# Patient Record
Sex: Male | Born: 1955 | Race: Black or African American | Hispanic: No | Marital: Married | Smoking: Never smoker
Health system: Southern US, Community
[De-identification: ages and names within clinical notes are randomized; demographics above are authoritative.]

## PROBLEM LIST (undated history)

## (undated) DIAGNOSIS — N3281 Overactive bladder: Secondary | ICD-10-CM

## (undated) DIAGNOSIS — R413 Other amnesia: Secondary | ICD-10-CM

## (undated) DIAGNOSIS — M79606 Pain in leg, unspecified: Secondary | ICD-10-CM

## (undated) DIAGNOSIS — F419 Anxiety disorder, unspecified: Secondary | ICD-10-CM

## (undated) DIAGNOSIS — T4145XA Adverse effect of unspecified anesthetic, initial encounter: Secondary | ICD-10-CM

## (undated) HISTORY — PX: ROTATOR CUFF REPAIR: SHX139

## (undated) HISTORY — DX: Pain in leg, unspecified: M79.606

## (undated) HISTORY — DX: Overactive bladder: N32.81

## (undated) HISTORY — PX: APPENDECTOMY: SHX54

## (undated) HISTORY — PX: FOOT SURGERY: SHX648

---
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
## 2003-02-17 ENCOUNTER — Inpatient Hospital Stay (HOSPITAL_COMMUNITY): Admission: RE | Admit: 2003-02-17 | Discharge: 2003-02-22 | Payer: Self-pay | Admitting: Specialist

## 2003-03-07 HISTORY — PX: BACK SURGERY: SHX140

## 2004-05-02 ENCOUNTER — Ambulatory Visit (HOSPITAL_COMMUNITY): Admission: RE | Admit: 2004-05-02 | Discharge: 2004-05-02 | Payer: Self-pay | Admitting: Podiatry

## 2004-06-15 ENCOUNTER — Ambulatory Visit (HOSPITAL_COMMUNITY): Admission: RE | Admit: 2004-06-15 | Discharge: 2004-06-15 | Payer: Self-pay | Admitting: Family Medicine

## 2004-07-14 ENCOUNTER — Ambulatory Visit (HOSPITAL_COMMUNITY): Admission: RE | Admit: 2004-07-14 | Discharge: 2004-07-14 | Payer: Self-pay | Admitting: General Surgery

## 2004-10-31 ENCOUNTER — Ambulatory Visit (HOSPITAL_COMMUNITY): Admission: RE | Admit: 2004-10-31 | Discharge: 2004-10-31 | Payer: Self-pay | Admitting: Podiatry

## 2009-08-09 ENCOUNTER — Ambulatory Visit (HOSPITAL_BASED_OUTPATIENT_CLINIC_OR_DEPARTMENT_OTHER): Admission: RE | Admit: 2009-08-09 | Discharge: 2009-08-09 | Payer: Self-pay | Admitting: Orthopedic Surgery

## 2010-05-23 LAB — POCT HEMOGLOBIN-HEMACUE: Hemoglobin: 14.5 g/dL (ref 13.0–17.0)

## 2010-07-22 NOTE — Op Note (Signed)
NAME:  Dwayne Acosta, Dwayne Acosta NO.:  [id number]   MEDICAL RECORD NO.:  [id number]          PATIENT TYPE:  AMB   LOCATION:  DAY                           FACILITY:  [facility]   PHYSICIAN:  Oley Balm. Pricilla Holm, D.P.M.DATE OF BIRTH:  1955-04-30   DATE OF PROCEDURE:  10/31/2004  DATE OF DISCHARGE:                                 OPERATIVE REPORT   SURGEON:  Oley Balm. Pricilla Holm, D.P.M.   TYPE OF ANESTHESIA:  Monitored anesthesia care.   PREOPERATIVE DIAGNOSIS:  Symptomatic internal fixation devices, left foot.   POSTOPERATIVE DIAGNOSIS:  Symptomatic internal fixation devices, left foot.   PROCEDURE:  Removal of internal fixation device x2, left foot.   INDICATIONS FOR SURGERY:  Pain from internal fixation device.   The patient brought into the operating room, placed on the operating table  in supine position. The patient's left foot and leg was then prepped and  draped in usual aseptic manner. Then, with an ankle tourniquet placed and  well padded to prevent contusion and elevated to 250 mmHg exsanguination of  the left foot, the following surgical procedure was performed under local  anesthesia.   REMOVAL OF INTERNAL FIXATION DEVICES X2, LEFT FOOT:  Attention was directed  to the dorsal aspect of the left foot at the level of the first metatarsal  where a linear incision, incision widened and deepened using sharp and blunt  dissection, being sure to identify and retract all vital structures. Capsule  incision was then made, and pins were isolated and identified, removed x2.  Wounds lavaged with copious amounts of sterile saline. Skin was approximated  using horizontal mattress suture with 4-0 Dexon.   All surgical sites were then infiltrated with approximately 0.8 cc of  dexamethasone phosphate. __________ compressive bandages consisting of  Betadine soaked Adaptic, sterile 4x4s, and sterile Kling were applied. The  patient tolerated the procedure well and left the operating room  in apparent  good condition. Her vital signs were stable in the recovery room.      Oley Balm Pricilla Holm, D.P.M.  Electronically Signed     DBT/MEDQ  D:  10/31/2004  T:  11/01/2004  Job:  [id number]

## 2010-07-22 NOTE — H&P (Signed)
NAME:  Dwayne Acosta, Dwayne Acosta NO.:  [id number]   MEDICAL RECORD NO.:  [id number]          PATIENT TYPE:  AMB   LOCATION:  DAY                           FACILITY:  [facility]   PHYSICIAN:  Oley Balm. Pricilla Holm, D.P.M.DATE OF BIRTH:  10/09/1955   DATE OF ADMISSION:  05/01/2004  DATE OF DISCHARGE:  LH                                HISTORY & PHYSICAL   HISTORY:  [age]-year-old black male presented to office chief  complaint of pain in left great toe joint.  The patient has had a  longstanding history of pain in his left great toe joint, he relates  difficulty with enclosed shoes, difficulty with ambulation.  The patient was  diagnosed with a hallux limitus/rigidus deformity, advised to wear stiffer  soled shoes, orthotics with a molder's extension and anti-inflammatory  medication.  Patient relates that basically his toe has gotten to the point  where he is not comfortable in any type of shoe and he requests surgical  correction of same.   PAST MEDICAL HISTORY:  1.  Back surgery.  2.  Medications:  Ultram.  3.  No allergies.  4.  No transfusions or hepatitis.   REVIEW OF SYSTEMS:  Uneventful.   EXAMINATION:  Lower extremity exam reveals palpable pedal pulses both DP and  PT with spontaneous capillary filling time.  Neurological exam essentially  within normal limits.  Musculoskeletal exam reveals pain to palpation over  the dorsal and medial aspects of the first MTP of the left foot.  There is  also limitation of range of motion, especially on standing there is  significant reduction of range of motion.  There is also pain on range of  motion.   ASSESSMENT:  Hallux limitus rigidus deformity.   PLAN:  Patient to undergo dorsal medial hyperostectomy, possible shortening  osteotomy of the first metatarsal.  Reviewed procedure with patient  including complications of procedure such as infection, bony infection,  postoperative pain, swelling, etc.  Patient seems to understand  the same and  surgery has been scheduled for May 01, 2004.      DBT/MEDQ  D:  05/01/2004  T:  05/01/2004  Job:  [id number]

## 2010-07-22 NOTE — Op Note (Signed)
NAME:  Dwayne Acosta, Dwayne Acosta NO.:  [id number]   MEDICAL RECORD NO.:  [id number]          PATIENT TYPE:  AMB   LOCATION:  DAY                           FACILITY:  [facility]   PHYSICIAN:  Oley Balm. Pricilla Holm, D.P.M.DATE OF BIRTH:  17-Jul-1955   DATE OF PROCEDURE:  05/02/2004  DATE OF DISCHARGE:                                 OPERATIVE REPORT   ANESTHESIA:  Monitored anesthesia care.   PREOPERATIVE DIAGNOSIS:  Hallux limitus valgus deformity, left foot.   POSTOPERATIVE DIAGNOSIS:  Hallux limitus valgus deformity, left foot.   PROCEDURE:  [place] bunionectomy, left foot, and shortening osteotomy, first  metatarsal, to repair hallux limitus valgus deformity.   INDICATIONS FOR SURGERY:  Long-standing history of pain, first MTP left  foot.   The patient was brought to the operating room and placed on the operating  table in supine position. The patient's lower left foot and leg were then  prepped and draped in the usual aseptic manner. Then, with an ankle  tourniquet placed and well padded to prevent contusion and elevated to 250  mmHg after exsanguination of the left foot, the following surgical  procedures were then performed under monitored anesthesia care.   [place]IN BUNIONECTOMY AND SHORTENING OSTEOTOMY, FIRST METATARSAL LEFT FOOT:  Attention was directed to the dorsal medial aspect of the first MTP where a  curvilinear incision was made. Incision widened and deepened via sharp and  blunt dissection, being sure to identify and retract all vital structures.  It should be noted that the patient had spurring on the dorsal medial aspect  of the first MTP, and there were noted pitted erosions on the dorsal aspect  of the metatarsal head as well as base of the phalanx. The medial limits of  the medial aspect was resected, the wound lavaged with copious amounts of  sterile saline, and then an Callaway type osteotomy was made. After the [facility]  osteotomy was made, the dorsal arm was then  resected another millimeter.  This had the effect of shortening the osteotomy. Also the osteotomy was made  in such a fashion as to shorten the first metatarsal. The wound was lavaged  with copious amounts of sterile saline, and the capital fragment was then  slid a mild amount laterally and then fixated with two 0.045 K wires. After  fixation, it was noted that the osteotomy site was stable, and the head of  the metatarsal was then in more correct anatomical as well as functional  position. The remaining protruding aspect of the metatarsal resected, all  rough edges rasped smooth, the wound lavaged with copious amounts of sterile  saline, and the capsular and subcutaneous tissue was approximated with  suture of 4-0 Dexon, skin was approximated utilizing running subcuticular  suture of 4-0 Dexon.   All surgical sites were infiltrated with approximately 18 cc of  dexamethasone phosphate, mild compressive bandages consisting of Betadine-  soaked Adaptic, sterile 4x4s and sterile cling were then applied. The  patient tolerated the procedure well and left the operating room in fair and  good condition. Vital signs were stable in the  recovery room.      DBT/MEDQ  D:  05/02/2004  T:  05/02/2004  Job:  [id number]

## 2010-07-22 NOTE — Consult Note (Signed)
NAME:  Dwayne Acosta, Dwayne Acosta NO.:  [id number]   MEDICAL RECORD NO.:  [id number]           PATIENT TYPE:  AMB   LOCATION:  DAY                           FACILITY:  [facility]   PHYSICIAN:  Oley Balm. Pricilla Holm, D.P.M.DATE OF BIRTH:  [birth year]   DATE OF CONSULTATION:  10/31/2004  DATE OF DISCHARGE:                                   CONSULTATION   HISTORY OF PRESENT ILLNESS:  Mr. Phaneuf is a 55 year old white male who  presents to this office post bunionectomy with pain from pin.  The patient  has pin irritation.  It has been decided to go ahead and remove his internal  fixation devices of his left first metatarsal.   PAST SURGICAL HISTORY:  1.  Previous bunion surgery.  2.  Back surgery.   MEDICATIONS:  The patient has no medications.   ALLERGIES:  No known drug allergies.   REVIEW OF SYSTEMS:  No transfusions or hepatitis.  Otherwise, uneventful.   PHYSICAL EXAMINATION:  EXTREMITIES:  Lower extremities reveal palpable pedal  pulses, both BP and DP with spontaneous capillary filling time.  NEUROLOGICAL:  Essentially within normal limits.  MUSCULOSKELETAL:  Pain to palpation over the first metatarsal of the left  foot with a pain score assessment, pain on first metatarsal secondary to  internal fixation device.   PLAN:  The patient is to undergo surgical removal.  Reviewed the procedure  of the patient including complication of procedure such as infection, bone  infection,  __________ pain swelling, etc., and the patient understands the  same.  Surgery has been scheduled for October 31, 2004.      Oley Balm Pricilla Holm, D.P.M.  Electronically Signed     DBT/MEDQ  D:  10/30/2004  T:  10/31/2004  Job:  [id number]

## 2010-07-22 NOTE — H&P (Signed)
NAME:  Dwayne Acosta, TAMBURRINO NO.:  [id number]   MEDICAL RECORD NO.:  [id number]          PATIENT TYPE:  AMB   LOCATION:                                 FACILITY:   PHYSICIAN:  Dalia Heading, M.D.  DATE OF BIRTH:  16-Jan-1956   DATE OF ADMISSION:  DATE OF DISCHARGE:  LH                                HISTORY & PHYSICAL   CHIEF COMPLAINT:  Weight loss of unknown etiology.   HISTORY OF PRESENT ILLNESS:  The patient is a 55 year old black male who was  referred for endoscopic evaluation.  He needs colonoscopy for weight loss of  uneventful.  He states he has had weight loss intermittently over the past  few months.  No abdominal pain, nausea, vomiting, diarrhea, constipation,  melena or hematochezia have been noted.  He has never had a colonoscopy.  There is no family history of colon carcinoma.   PAST MEDICAL HISTORY:  Unremarkable.   PAST SURGICAL HISTORY:  Foot surgery.   CURRENT MEDICATIONS:  None.   ALLERGIES:  No known drug allergies.   REVIEW OF SYSTEMS:  Noncontributory.   PHYSICAL EXAMINATION:  GENERAL APPEARANCE:  The patient is a well-developed,  well-nourished, black male in no acute distress.  VITAL SIGNS:  He is afebrile and vital signs are stable.  LUNGS:  Clear to auscultation with equal breath sounds bilaterally.  HEART:  Regular rate and rhythm without S3, S4 or murmurs.  ABDOMEN:  Soft, nontender and nondistended.  No hepatosplenomegaly or masses  are noted.  RECTAL:  Examination was deferred to the procedure.   IMPRESSION:  Weight loss of uneventful.   PLAN:  The patient is scheduled for a colonoscopy on Jul 14, 2004.  The  risks and benefits of the procedure, including bleeding and perforation were  fully explained to the patient, who gave informed consent.      MAJ/MEDQ  D:  06/23/2004  T:  06/23/2004  Job:  [id number]   cc:   [facility]  Fax: [phone]   Patrica Duel, M.D.  [address]  [place]  [place]  [address]  Fax: [phone]

## 2010-07-22 NOTE — Op Note (Signed)
NAME:  JAHARI, BILLY NO.:  [id number]   MEDICAL RECORD NO.:  [id number]                   PATIENT TYPE:  INP   LOCATION:  5021                                 FACILITY:  [facility]   PHYSICIAN:  Kerrin Champagne, M.D.                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   PROCEDURES:  1. Central decompressive laminectomy at L5 with bilateral L5 nerve root     decompression.  2. Right-sided transforaminal lumbar interbody fusion using a 7 mm Leopard     lordotic cage with right iliac crest bone graft within the cage and also     within the intervertebral disk space.  3. Posterolateral fusion, L5-S1, with Monarch pedicle screw and rod fixation     utilizing Symphony graft with a combination of both local bone graft from     the central laminectomy, right iliac crest bone graft, and Allomatrix     bone graft material.   SURGEON:  Kerrin Champagne, M.D.   ASSISTANT:  Wende Neighbors, P.A.   ANESTHESIA:  GOT, [place], M.D.   ESTIMATED BLOOD LOSS:  800 mL.  Cell saver blood returned 200 mL. The  patient does have autogenous blood available if necessary, two units.   DRAINS:  Foley to straight drain, Hemovac x1.   BRIEF CLINICAL HISTORY:  This patient is a 55 year old male with a  longstanding history of lumbar spondylolisthesis, L5-S1.  This is a grade 2  slip with marked bilateral neural foraminal stenosis.  He has pain primarily  into his right leg greater than left, weakness in right foot dorsiflexion,  and EHL weakness on the right.  The patient was diagnosed with this problem  in 2003 and been treated conservatively; however, he is having  increasing  pain with ambulation, upright position.  He is brought to the operating room  to undergo a central decompressive laminectomy with fusion at the L5-S1  level, use of T-LIF procedure, posterolateral fusion, and instrumentation  with Monarch pedicle screws and rods.   INTRAOPERATIVE FINDINGS:  Severe nerve root entrapment, right side L5 nerve  root, secondary to hypertrophic change involving the region of the pars  defect over the right pars interarticularis.  Slipped portion of the pars  pressing upon the L5 nerve root and entrapping it.  The patient also was  found to have entrapment of the left L5 nerve root secondary to lateral  recess stenosis at the L4-5 level with again hypertrophic scar related to  the previous pars defect with attempt at healing compressing the left L5  nerve root.  The patient's  L5-S1 disk space markedly narrowed, requiring  osteotomy of lip osteophytes posteriorly in order to allow for entry.  Dilation performed up to 8 mm, which was quite tight.  An 8 mm cage could  not be fit.  A 7 mm cage was used.  The 8 mm cage fractured with attempts at  insertion, requiring removal of the cage, and bone graft was able to be  salvaged from the cage for replacement in the new cage.   DESCRIPTION OF PROCEDURE:  After adequate general anesthesia, the patient in  a prone position, chest rolls were placed and all pressure points well-  padded.  TED hose to prevent DVT.  Intraoperative monitoring of pedicle  screw placement was performed with leads in both lower extremities to  measure continuous EMG activity of both lower extremities.  In addition to  this, soft tissue resistance to be measured intraoperatively with pedicle  screw fixation.  Standard preoperative, prepped in the midline from the  lower dorsal spine to about S3.  Duraprep solution, draped in the usual  manner, iodine-impregnated Vi-Drape was used.  An incision in the midline  extending from about S2  to about L3 through the skin and subcutaneous layers  directly down to the lumbodorsal fascia, incised on both sides of the  expected spinous process of L5, S1, and s2, then carried superiorly to L4  and L3.  Initial retractors were then placed, a clamp placed on the spinous  process of L5, intraoperative C-arm fluoroscopy used to ascertain the  position and alignment of this clamp.  It was found to be on L5.  Cobbs then  used to elevate the paralumbar muscles off the posterior aspect of the  elements extending from L4 to S2.  The patient had quite wide posterior  elements requiring extensive dissection posterior laterally within the  paralumbar muscles in order to visualize the facets, protect the facets at  the L4-5 level, capsulotomy was performed at L5-S1.   With dissection, exposure carried out over the sacral ala bilaterally down  to the tips of the transverse processes bilaterally, transverse processes  with grade 2 slip found to be quite deep in this patient.  The patient's  pelvis, posterior superior iliac crest were in close approximation to the  midline inferiorly, making placement of pedicle screws and exposure somewhat  difficult.  However, with careful exposure, careful freeing up the soft  tissues, we were able to expose the posterolateral gutters quite nicely,  exposed the areas of the transverse processes so that they could be finger-  palpated and visualized, sacral ala as well with all bleeders controlled  using bipolar electrocautery. With this exposure the Viper retractor was  placed.  The first screw placed on the right side at the L5 level using C-  arm fluoroscopy to visualize the entry point at the base of the superior  articular process of L5 at its intersection with the transverse process.  This initial screw was later replaced at the end of the case as it appeared to be high and somewhat superior to the pedicle in its initial placement.   The awl was used to  make an entry point at the expected intersection of the  transverse process, pedicle, and the superior articular process.  Then a  hand-held pedicle finder used to probe the pedicle.  This was measured for  depth and measured about 40 mm, and this area was then tapped with a 5.5 tap  and a 6.25 tap 40  mm length screw was then inserted here.  A second screw  was then placed at the S1 level on the right side, removing the inferior  articular process of L5, finding the depression just inferior the superior  articular process of S1.  Here an awl was introduced, then a hand-held  pedicle finder inserted and more significant convergent angle and also he  had more significant sacral inclination.  This was inserted without  difficulty on the right side, carried down to 40 mm in order to try and  penetrate the anterior cortex at this level and obtain more purchase. The  tap was performed at 6.25, then a 7.0 screw was placed on the right side.  Similarly screws were then placed on the left side at L5 and at S1.  With  this, then a Leksell rongeur was used to remove the spinous process of L5.  The central portions and lamina were thinned, the 3 mm Kerrison used to  excise central portions of the lamina, the lamina then carefully removed  bilaterally using curettes and carefully loosening the facet joints and  removing the lamina as a whole on each side.  This left remaining scar  tissue within the neural foramen on both sides secondary to the attempts at  healing of the previous pars interarticularis defects as well as ligamentum  flavum at the L4-5 level and inferiorly at L5-S1.  Ligamentum flavum at the  L5-S1 level was resected in its entirety off the medial aspect of the facets  bilaterally and down to the S1 level, performing foraminotomies in both S1  nerve roots.  The slip being a grade 2, medial portion of the L4 and L5  facets were carefully debrided of hypertrophic ligamentum flavum, and at   this level the L5 nerve roots were identified and foraminal decompression  carried out over the L5 nerve roots, right side and left side.  This was  carried out distally out as far as was necessary to free up the nerve root  so the nerve root exited without difficulty.  The right nerve root was found  to be severely compressed secondary to hypertrophic scar tissue underlying  the pars interarticularis defect.  Narrowing also secondary to the slip of  grade 2 narrowing the intra-articular distance.  Once decompression was  carried out, hemostasis obtained using thrombin-soaked Gelfoam and  cottonoids.  A laminar spreader placed between the spinous process of S1 and  L4 to obtain some distraction across the disk space.  The right-sided thecal  sac as well as the S1 nerve root carefully retracted medially and the L5  nerve root retracted superiorly, allowing exposure of the disk on the right side at the L5-S1 level, which was notably slipped.  Bleeders controlled  using bipolar electrocautery.  A 15 blade scalpel used to incise the  posterior aspect of the disk on the right side, removing about 50% of the  posterior aspect of the disk on the right side, posterior longitudinal  ligament as well as annulus fibrosus.  Pituitary rongeurs were used to  debride soft tissue disk material posteriorly at this level.  An osteotome  was introduced into the defect, the slip area, carefully protecting the L5  nerve root as well as the thecal sac and S1 nerve root.  This osteotome was  directed to the inferior posterior lip of L5 on C-arm fluoroscopy used to  remove a small portion of the lip here, allowing for entry into the disk  space.  Additionally a small amount of posterior lip of the right side of S1  superiorly was resected as well to allow entry in the disk space for  debridement of the disk.  This was done using pituitary rongeurs.  With the  distraction, a dilator was introduced in the disk space,  first a 7 mm, and  then 8 mm dilator, obtaining excellent dilatation of the disk space.  Curettes then used to curettage the end plates both of the inferior aspect  of L5, superior aspect of S1, back to bleeding cancellous bone surfaces.  Ring curettes were used as well as straight, upbiting, and downbiting  curettes.  Osteotome then used to carefully judge the degree of  decortication present here.  Bone graft was then harvested through a  separate fascial incision on the right side down to the right posterior  superior iliac spine, subperiosteal dissection carried medial and lateral.  A curved three-quarter inch osteotome used to resect wafers of bone and  corticocancellous bone strips and then the inner table of cancellous bone  was resected and removed using gouges.  Enough bone was harvested to allow  for posterolateral fusion as well as the T-LIF procedure in addition to  Allomatrix bone graft and local bone graft.  Once this was completed,  Gelfoam thrombin-soaked was placed over the right iliac crest region.  The  midline incision reopened.  Sounding of the intervertebral disk space was  carried out, first a 7 mm, then an 8 mm sounder.  The 8 mm seemed to provide  a very excellent fit and the C-arm image demonstrated good position and  alignment of the cage.  An 8 mm Leopard cage was then packed with cancellous  bone graft.  This was then placed over the right side, and attempts at  impacting the cage into place were unsuccessful and the cage did fracture  with attempts at insertion.  The laminar spreader was reintroduced and the  cage removed and its remnants removed.  Careful inspection of this space  demonstrated no further remnants of cage material present.  Carefully the  cancellous bone graft material that was used in the previous cage was  removed, to be used in a secondary cage.  A 7 mm cage was then packed full of cancellous bone graft.  This cage was then able to be  carefully  introduced and impacted into place with a lordotic cage measuring 7 mm in  height.  This was then kicked over the midline using the special impactors  provided.  Observing on C-arm fluoroscopy, the cage was in the anterior half  of the intervertebral disk space.  Additional cancellous bone graft was  placed prior to placement of the cage into the intervertebral disk space and  additional cancellous bone graft placed behind the cage.  Once this was  completed, then exposure was obtained over the transverse processes  bilaterally.  Allomatrix bone graft material was then placed bilaterally.  The patient had rod placement into the screw fasteners, first on the left  side, then on the right side.  The screw fastener tops were then applied  free-hand.  On the left side the superior fastener was then carefully  tightened into place using 100 foot pounds using the anti-torque device to  prevent movement of the rod and loosening of the screw.  This then  completed, care was taken to ensure the rod was properly aligned with less  rod protruding superiorly.  Inferior screw fastener was then  tightened  following compression between the two screws obtained using the rod  compressor.  Again the inferior screw at S1 tightened to 100 foot pounds.  Attention then turned to the right side, where rods were placed.  Intraoperative radiograph in the AP and lateral planes demonstrated that the  superior screw on the right side at L5 appeared to be extrapedicular and  appeared to be adjacent to the pedicle superiorly.  It was felt that this  required replacing of the pedicle screw.  Carefully this screw was removed,  the rod removed, and the fastener tops removed.  Inspection of the joint,  the joint appeared to be intact.  A high-speed bur was used to make an  initial entry point into extremely sclerotic bone in the inferior aspect of  the superior articular process at L5 on the right side at its  intersection  with the transverse process here.  Careful inspection of the laminotomy area  demonstrated that the pedicle appeared to be quite prominent and easily  identified within the spinal canal with markers placed here in the form of  Penfield 4 as well as a hockey stick neural probe.  The drill hole was such  placed and then a hand-held pedicle finder was introduced in order to probe  the pedicle channel at the L5 level on the right side.  This was then  measured for depth, measured approximately 35 mm in depth.  The 45 mm screw  was chosen because of the prominence of the S1 pedicle screw on the right  side necessitating the screw to be placed at the L5 level, allowing for the  fastener to be somewhat proud, and this would allow easier placement of the  rod and screw construct.  The screw was placed without difficulty.  Intraoperative C-arm fluoroscopy in both AP and lateral planes demonstrated the screw in excellent position and alignment.  Following tapping, then a 45  mm length screw was placed.  The rod was then placed and easily passed into  place using the fasteners.  The superior fastener was then attached to the  rod and torqued down to 100 foot pounds, care taken to ensure that an  adequate amount of rod protruded superiorly and that this was kept to a  minimum to prevent any impingement on the soft tissue here.  The lower screw  was then tightened to 100 foot pounds following compression using the  compressor provided.  This then completed, irrigation was performed in the  right iliac crest bone graft harvest site as well as central laminotomy  areas.  Bone graft carefully applied over the transverse process of both  sides.  The central laminotomy defect evaluated, the L5 nerve roots exiting  both sides without signs of neural compression.  No further sign of  compression of the L5 or S1 nerve roots noted, no evidence of retained bone  material centrally noted.  The right  iliac crest bone graft harvest site was  irrigated.  Platelet-rich plasma obtained from the Symphony material was  then sprayed over the right iliac crest bone graft harvest site as well as  central laminotomy defect.  Small areas of devitalized muscle were carefully  debrided in the central laminotomy area.  A medium Hemovac drain placed in  the depth of the incision, right iliac crest bone graft harvest site  reapproximated with a running stitch of #1 Vicryl.  Midline incision site of  the lumbar muscles reapproximated in the midline with interrupted #  1 Vicryl  sutures loosely.  The lumbodorsal fascia then approximated with interrupted  #1 Vicryl sutures, the deep subcu layers approximated with interrupted #1  and 0 Vicryl sutures, more superficial layers with interrupted 2-0 Vicryl  sutures, and skin closed with a running subcu stitch of 4-0 Vicryl.  Tincture of Benzoin and Steri-Strips applied.  Four by fours, ABD pads fixed  to the skin with Hypafix tape.  The patient was then returned to a supine  position, reactivated, extubated, and returned to the recovery room in  satisfactory condition.  All instrument and sponge counts were correct.                                               Kerrin Champagne, M.D.    Myra Rude  D:  02/17/2003  T:  02/18/2003  Job:  [id number]

## 2010-07-22 NOTE — Discharge Summary (Signed)
NAME:  Dwayne Acosta, Dwayne Acosta NO.:  [id number]   MEDICAL RECORD NO.:  [id number]                   PATIENT TYPE:  INP   LOCATION:  5021                                 FACILITY:  [facility]   PHYSICIAN:  Kerrin Champagne, M.D.                DATE OF BIRTH:  [birth year]   DATE OF ADMISSION:  02/17/2003  DATE OF DISCHARGE:  02/22/2003                                 DISCHARGE SUMMARY   ADMISSION DIAGNOSIS:  Painful spondylolisthesis at L5-S1 with bilateral L5  nerve root entrapment.   DISCHARGE DIAGNOSES:  1. Painful spondylolisthesis at L5-S1 with bilateral L5 nerve root     entrapment with postoperative hemorrhagic anemia requiring blood     transfusion.  2. Postoperative ileus, resolved at discharge.   PROCEDURES:  1. Single decompressive laminectomy at L5 with bilateral L5 nerve root     decompression.  2. Right-sided trans foraminal lumbar inner body fusion with right iliac     crest bone graft.  3. Posterolateral fusion L5-S1 with moderate pedicle screws and rod fixation     utilizing Symphony graft and local bone graft, right iliac crest bone     graft and Allomatrix bone graft material performed by Dr. Otelia Sergeant, assisted     by Maud Deed, P.A.-C. under general anesthesia.   CONSULTATIONS:  [facility].   HISTORY:  The patient is a 55 year old black male with a longstanding  history of lumbar spinal listhesis at L5-S1. He was noted to have a grade 2  slip with marked bilateral neuroforaminal stenosis. The pain has been  progressive with weakness in the right with dorsiflexion and EHL.  Conservative treatment was no longer allowing him a tolerable quality of  life. It was felt that he required surgical intervention and was admitted  for the procedure as stated above.   HOSPITAL COURSE:  The patient tolerated the procedure under general  anesthesia without complications. Postoperative he was noted to have  neurovascular motor function intact with  his EHL strength 5/5 bilaterally.  Initially he was treated with PCA analgesics and gradually weaned to p.o.  analgesics. His diet was clear liquids only for the first couple of days,  however he was having very little flatus and began to have bloating.  Laxatives and stool softeners were utilized, however, the patient was unable  to have a bowel movement. A GI consult was obtained and KUB did indicate a  postoperative ileus. He did require use of an NG tube, which was eventually  discontinued. The patient was treated with medications including Reglan and  Protonix. Eventually he was having bowel movements and his diet was slowly  advanced. His ileus did resolve. The patient did require a blood transfusion  during the hospital stay after his hemoglobin was decreased to 7.8 with a  hematocrit of 23. He tolerated two units of packed red blood cells without  difficulty. The patient  received physical therapy for ambulation and gait  training. He was focusing quite well during the hospital stay. He was taught  dietary precautions. He did utilize an [employer] while out of bed. Dressing  changes were done daily and his wound was found to be healing well  throughout the hospital stay. At the time of discharge the patient was  ambulating well. His pain was well-controlled with p.o. analgesics.  Hemoglobin and hematocrit were stable at 9.2 and 28. The patient was  discharged home in stable condition.   PERTINENT LABORATORY DATA:  Preoperative CBC with a hemoglobin and  hematocrit 12.6 and 37.9, respectively, lowest value 7.8 and 23.0. Post  transfusion hemoglobin and hematocrit were stable at 12.8 and 38.1. White  blood cell count was elevated slightly at 12.0 on December 18, however on  December 19 was normal at 7.2. Coagulation study on admission was within  normal limits. BMET on admission was within normal limits with a glucose of  108. Repeats were monitored throughout the hospital stay showing  values  within normal limits with the exception of slightly elevated glucose.  Urinalysis on admission was normal and repeat on December 16 was also  normal. Cell saver was used intraoperatively and the patient did receive 200  cc of return red blood cells. EKG on admission showed sinus bradycardia,  otherwise normal EKG.   PLAN:  The patient was discharged to his home. Prescriptions were given for  Reglan 10 mg before meals for the next two to three days. Protonix 40 mg  daily for one month. Darvocet-N 100 prescription was given for pain and  Robaxin 500 mg for spasms. The patient was advised to continue on a low  residual diet for two days and then continue on a regular diet. He will  change his dressing daily at home. The patient will walk as tolerated. He is  advised no lifting, bending, twisting, or driving. He is to wear his brace  at all times when he is out of bed. The patient will be allowed to shower  and dressing changes done daily to the wound on his back. He will follow up  with Dr. Otelia Sergeant two weeks from the date of his surgery and was given  instructions to call to make this appointment.   CONDITION ON DISCHARGE:  Stable.      Wende Neighbors, P.A.                    Kerrin Champagne, M.D.    SMV/MEDQ  D:  03/26/2003  T:  03/27/2003  Job:  [id number]

## 2011-10-03 ENCOUNTER — Other Ambulatory Visit (HOSPITAL_COMMUNITY): Payer: Self-pay | Admitting: Physician Assistant

## 2011-10-03 DIAGNOSIS — R351 Nocturia: Secondary | ICD-10-CM

## 2011-10-03 DIAGNOSIS — R1032 Left lower quadrant pain: Secondary | ICD-10-CM

## 2011-10-05 ENCOUNTER — Other Ambulatory Visit (HOSPITAL_COMMUNITY): Payer: Self-pay

## 2011-10-13 ENCOUNTER — Ambulatory Visit (HOSPITAL_COMMUNITY)
Admission: RE | Admit: 2011-10-13 | Discharge: 2011-10-13 | Disposition: A | Discharge status: Home / Self Care | Payer: 59 | Source: Ambulatory Visit | Attending: Physician Assistant | Admitting: Physician Assistant

## 2011-10-13 DIAGNOSIS — R351 Nocturia: Secondary | ICD-10-CM

## 2011-10-13 DIAGNOSIS — R109 Unspecified abdominal pain: Secondary | ICD-10-CM | POA: Insufficient documentation

## 2011-10-13 DIAGNOSIS — K7689 Other specified diseases of liver: Secondary | ICD-10-CM | POA: Insufficient documentation

## 2011-10-13 DIAGNOSIS — R1032 Left lower quadrant pain: Secondary | ICD-10-CM | POA: Insufficient documentation

## 2011-10-25 ENCOUNTER — Other Ambulatory Visit (HOSPITAL_COMMUNITY): Payer: Self-pay | Admitting: Physician Assistant

## 2011-10-25 DIAGNOSIS — K7689 Other specified diseases of liver: Secondary | ICD-10-CM

## 2011-10-26 ENCOUNTER — Other Ambulatory Visit: Payer: Self-pay | Admitting: Orthopedic Surgery

## 2011-10-26 DIAGNOSIS — M25511 Pain in right shoulder: Secondary | ICD-10-CM

## 2011-10-27 ENCOUNTER — Ambulatory Visit (HOSPITAL_COMMUNITY): Admission: RE | Admit: 2011-10-27 | Payer: 59 | Source: Ambulatory Visit

## 2011-11-07 ENCOUNTER — Ambulatory Visit
Admission: RE | Admit: 2011-11-07 | Discharge: 2011-11-07 | Disposition: A | Discharge status: Home / Self Care | Payer: 59 | Source: Ambulatory Visit | Attending: Orthopedic Surgery | Admitting: Orthopedic Surgery

## 2011-11-07 DIAGNOSIS — M25511 Pain in right shoulder: Secondary | ICD-10-CM

## 2011-11-07 MED ORDER — IOHEXOL 180 MG/ML  SOLN
20.0000 mL | Freq: Once | INTRAMUSCULAR | Status: AC | PRN
  Administered 2011-11-07: 20 mL via INTRA_ARTICULAR

## 2012-10-23 DIAGNOSIS — H2510 Age-related nuclear cataract, unspecified eye: Secondary | ICD-10-CM

## 2012-10-23 DIAGNOSIS — H40023 Open angle with borderline findings, high risk, bilateral: Secondary | ICD-10-CM | POA: Insufficient documentation

## 2012-10-23 HISTORY — DX: Age-related nuclear cataract, unspecified eye: H25.10

## 2012-10-23 HISTORY — DX: Open angle with borderline findings, high risk, bilateral: H40.023

## 2012-12-12 DIAGNOSIS — H04129 Dry eye syndrome of unspecified lacrimal gland: Secondary | ICD-10-CM | POA: Insufficient documentation

## 2012-12-12 DIAGNOSIS — H0288A Meibomian gland dysfunction right eye, upper and lower eyelids: Secondary | ICD-10-CM

## 2012-12-12 HISTORY — DX: Meibomian gland dysfunction right eye, upper and lower eyelids: H02.88A

## 2013-04-16 DIAGNOSIS — H10219 Acute toxic conjunctivitis, unspecified eye: Secondary | ICD-10-CM | POA: Insufficient documentation

## 2013-11-18 ENCOUNTER — Other Ambulatory Visit: Payer: Self-pay | Admitting: Otolaryngology

## 2013-11-18 DIAGNOSIS — H905 Unspecified sensorineural hearing loss: Secondary | ICD-10-CM

## 2013-11-18 DIAGNOSIS — H9190 Unspecified hearing loss, unspecified ear: Secondary | ICD-10-CM

## 2013-11-18 DIAGNOSIS — H9319 Tinnitus, unspecified ear: Secondary | ICD-10-CM

## 2013-12-13 ENCOUNTER — Other Ambulatory Visit: Payer: 59

## 2013-12-15 ENCOUNTER — Other Ambulatory Visit: Payer: 59

## 2013-12-30 ENCOUNTER — Ambulatory Visit
Admission: RE | Admit: 2013-12-30 | Discharge: 2013-12-30 | Disposition: A | Discharge status: Home / Self Care | Payer: 59 | Source: Ambulatory Visit | Attending: Otolaryngology | Admitting: Otolaryngology

## 2013-12-30 DIAGNOSIS — H905 Unspecified sensorineural hearing loss: Secondary | ICD-10-CM

## 2013-12-30 DIAGNOSIS — H9190 Unspecified hearing loss, unspecified ear: Secondary | ICD-10-CM

## 2013-12-30 DIAGNOSIS — H9319 Tinnitus, unspecified ear: Secondary | ICD-10-CM

## 2013-12-30 MED ORDER — GADOBENATE DIMEGLUMINE 529 MG/ML IV SOLN
18.0000 mL | Freq: Once | INTRAVENOUS | Status: AC | PRN
  Administered 2013-12-30: 18 mL via INTRAVENOUS

## 2014-02-24 ENCOUNTER — Ambulatory Visit: Payer: 59 | Admitting: Neurology

## 2014-02-24 ENCOUNTER — Ambulatory Visit (INDEPENDENT_AMBULATORY_CARE_PROVIDER_SITE_OTHER): Payer: 59 | Admitting: Diagnostic Neuroimaging

## 2014-02-24 ENCOUNTER — Encounter: Payer: Self-pay | Admitting: Diagnostic Neuroimaging

## 2014-02-24 VITALS — BP 115/77 | HR 61 | Ht 70.0 in | Wt 194.6 lb

## 2014-02-24 DIAGNOSIS — R9082 White matter disease, unspecified: Secondary | ICD-10-CM

## 2014-02-24 DIAGNOSIS — H9313 Tinnitus, bilateral: Secondary | ICD-10-CM

## 2014-02-24 DIAGNOSIS — R930 Abnormal findings on diagnostic imaging of skull and head, not elsewhere classified: Secondary | ICD-10-CM

## 2014-02-24 DIAGNOSIS — H905 Unspecified sensorineural hearing loss: Secondary | ICD-10-CM

## 2014-02-24 NOTE — Progress Notes (Signed)
[facility]  PATIENT: Dwayne Acosta DOB: [birth year]  REFERRING CLINICIAN: Wilburn Cornelia HISTORY FROM: patient  REASON FOR VISIT: new consult    HISTORICAL  CHIEF COMPLAINT:  Chief Complaint  Patient presents with  . Neurologic Problem    HISTORY OF PRESENT ILLNESS:   58 year old right-handed male here for evaluation of tinnitus, sensorineural hearing loss and abnormal MRI brain. Her graft one year ago patient had onset of ringing in the ears, bilaterally. Patient saw ENT in [facility], then referred to ENT in [facility]. Patient also developed decreased hearing in the left ear. 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 He also uses a riding lawnmower frequently. Patient has been using hearing protection in the past.  Patient was tried on very medications for tinnitus, including prednisone recently. Tinnitus seems to have subsided since starting prednisone. Patient also had MRI of the brain performed which showed a few nonspecific white matter abnormalities. Patient referred to see me for evaluation of these MRI abnormalities.  No history of numbness, weakness, slurred speech, blurred vision, trouble talking.    REVIEW OF SYSTEMS: Full 14 system review of systems performed and notable only for memory loss nervous feeling hearing loss ringing in ears overactive bladder.  ALLERGIES: No Known Allergies  HOME MEDICATIONS: No outpatient prescriptions prior to visit.   No facility-administered medications prior to visit.    PAST MEDICAL HISTORY: Past Medical History  Diagnosis Date  . Overactive bladder     PAST SURGICAL HISTORY: Past Surgical History  Procedure Laterality Date  . Back surgery    . Rotator cuff repair Bilateral   . Foot surgery      FAMILY HISTORY: Family History  Problem  Relation Age of Onset  . High blood pressure Mother   . Stroke Paternal Grandmother   . Heart attack Father   . Parkinson's disease Father   . Bone cancer Maternal Grandfather     SOCIAL HISTORY:  History   Social History  . Marital Status: Married    Spouse Name: Santiago Glad    Number of Children: 1  . Years of Education: College   Occupational History  .  [employer]   Social History Main Topics  . Smoking status: Never Smoker   . Smokeless tobacco: Never Used  . Alcohol Use: No  . Drug Use: No  . Sexual Activity: Not on file   Other Topics Concern  . Not on file   Social History Narrative   Patient lives at home with his family.   Caffeine Use: very little     PHYSICAL EXAM  Filed Vitals:   02/24/14 1350  BP: 115/77  Pulse: 61  Height: 5\' 10"  (1.778 m)  Weight: 194 lb 9.6 oz (88.27 kg)    Body mass index is 27.92 kg/(m^2).   Visual Acuity Screening   Right eye Left eye Both eyes  Without correction:     With correction: 20/30 20/40     No flowsheet data found.  GENERAL EXAM: Patient is in no distress; well developed, nourished and groomed; neck is supple  CARDIOVASCULAR: Regular rate and rhythm, no murmurs, no carotid bruits  NEUROLOGIC: MENTAL STATUS: awake, alert, oriented to person, place and time, recent and remote memory intact, normal attention and concentration, language fluent, comprehension intact, naming intact, fund of knowledge appropriate CRANIAL NERVE: no papilledema on fundoscopic  exam, pupils equal and reactive to light, visual fields full to confrontation, extraocular muscles intact, no nystagmus, facial sensation and strength symmetric, hearing intact, palate elevates symmetrically, uvula midline, shoulder shrug symmetric, tongue midline. MOTOR: normal bulk and tone, full strength in the BUE, BLE SENSORY: normal and symmetric to light touch, pinprick, temperature, vibration  COORDINATION: finger-nose-finger, fine finger movements,  heel-shin normal REFLEXES: deep tendon reflexes present and symmetric GAIT/STATION: narrow based gait; able to walk on toes, heels and tandem; romberg is negative    DIAGNOSTIC DATA (LABS, IMAGING, TESTING) - I reviewed patient records, labs, notes, testing and imaging myself where available.  Lab Results  Component Value Date   HGB 14.5 08/09/2009   No results found for: NA, K, CL, CO2, GLUCOSE, BUN, CREATININE, CALCIUM, PROT, ALBUMIN, AST, ALT, ALKPHOS, BILITOT, GFRNONAA, GFRAA No results found for: CHOL, HDL, LDLCALC, LDLDIRECT, TRIG, CHOLHDL No results found for: HGBA1C No results found for: VITAMINB12 No results found for: TSH  I reviewed images myself and agree with interpretation. These are very non-specific, mild findings, and could be normal variant as well. -VRP  12/31/13 MRI brain and IAC (with and without): 1. No acute or focal lesion to explain the patient's hearing loss or tinnitus. Normal appearance of the IAC's. 2. Mild periventricular white matter changes are evident about the atria of the lateral ventricles. The finding is nonspecific but can be seen in the setting of chronic microvascular ischemia, a demyelinating process such as multiple sclerosis, vasculitis, complicated migraine headaches, or as the sequelae of a prior infectious or inflammatory process.   ASSESSMENT AND PLAN  58 y.o. 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 The MRI findings are very nonspecific and I do not think are related to patient's symptoms. Advised monitoring for now. Patient should follow-up with PCP for blood pressure, diabetes, cholesterol screening.  PLAN: - monitor symptoms; follow up as needed   Return if symptoms worsen or fail to improve, for return to ENT.    Penni Bombard, MD 68/05/2120, 4:82 PM Certified in Neurology, Neurophysiology and  Neuroimaging  [facility] [address] [place], [place] [address] [phone]

## 2014-02-24 NOTE — Patient Instructions (Signed)
Monitor symptoms. Follow up as needed. 

## 2015-02-23 ENCOUNTER — Emergency Department (HOSPITAL_BASED_OUTPATIENT_CLINIC_OR_DEPARTMENT_OTHER)
Admission: EM | Admit: 2015-02-23 | Discharge: 2015-02-23 | Disposition: A | Discharge status: Home / Self Care | Payer: 59 | Attending: Emergency Medicine | Admitting: Emergency Medicine

## 2015-02-23 ENCOUNTER — Emergency Department (HOSPITAL_COMMUNITY)
Admission: EM | Admit: 2015-02-23 | Discharge: 2015-02-23 | Disposition: A | Discharge status: Home / Self Care | Payer: Self-pay

## 2015-02-23 ENCOUNTER — Encounter (HOSPITAL_BASED_OUTPATIENT_CLINIC_OR_DEPARTMENT_OTHER): Payer: Self-pay | Admitting: *Deleted

## 2015-02-23 DIAGNOSIS — L0201 Cutaneous abscess of face: Secondary | ICD-10-CM | POA: Diagnosis present

## 2015-02-23 DIAGNOSIS — Z87448 Personal history of other diseases of urinary system: Secondary | ICD-10-CM | POA: Diagnosis not present

## 2015-02-23 DIAGNOSIS — L0291 Cutaneous abscess, unspecified: Secondary | ICD-10-CM

## 2015-02-23 MED ORDER — LIDOCAINE-EPINEPHRINE (PF) 2 %-1:200000 IJ SOLN
10.0000 mL | Freq: Once | INTRAMUSCULAR | Status: AC
  Administered 2015-02-23: 10 mL

## 2015-02-23 MED ORDER — TRAMADOL HCL 50 MG PO TABS: 50.0000 mg | ORAL_TABLET | Freq: Four times a day (QID) | ORAL | Status: DC | PRN

## 2015-02-23 MED ORDER — LIDOCAINE-EPINEPHRINE (PF) 2 %-1:200000 IJ SOLN
INTRAMUSCULAR | Status: AC
  Administered 2015-02-23: 10 mL
  Filled 2015-02-23: qty 10

## 2015-02-23 MED ORDER — LIDOCAINE HCL (PF) 1 % IJ SOLN
INTRAMUSCULAR | Status: DC
Start: 2015-02-23 — End: 2015-02-24
  Filled 2015-02-23: qty 5

## 2015-02-23 MED ORDER — TRAMADOL HCL 50 MG PO TABS
50.0000 mg | ORAL_TABLET | Freq: Once | ORAL | Status: AC
  Administered 2015-02-23: 50 mg via ORAL
  Filled 2015-02-23: qty 1

## 2015-02-23 NOTE — ED Notes (Signed)
EDPA at [facility], lidocaine injection in progress.

## 2015-02-23 NOTE — ED Notes (Addendum)
Abscess to his chin. His MD put him on Bactrim DS yesterday. Puss is oozing from the site.

## 2015-02-23 NOTE — ED Notes (Signed)
Pt not in room.

## 2015-02-23 NOTE — ED Provider Notes (Signed)
CSN: [id number]     Arrival date & time 02/23/15  1918 History   First MD Initiated Contact with Patient 02/23/15 1933     Chief Complaint  Patient presents with  . Abscess   HPI   59 year old male presents today with an abscess to his chin. Patient reports symptoms aren't at approximately week ago with a firm nodule that has progressed to the current abscess. He reports some purulent drainage noted from the abscess, denies any surrounding redness, warmth to touch, edema. Patient reports he was seen by his primary care yesterday and was given 2 weeks worth of Bactrim. Patient reports symptoms have not improved, and is concerned that this may need drainage. Patient denies any systemic symptoms including fever, nausea, vomiting. Patient denies neck stiffness or pain, swelling to the floor the mouth or tongue.   Past Medical History  Diagnosis Date  . Overactive bladder    Past Surgical History  Procedure Laterality Date  . Back surgery    . Rotator cuff repair Bilateral   . Foot surgery     Family History  Problem Relation Age of Onset  . High blood pressure Mother   . Stroke Paternal Grandmother   . Heart attack Father   . Parkinson's disease Father   . Bone cancer Maternal Grandfather    Social History  Substance Use Topics  . Smoking status: Never Smoker   . Smokeless tobacco: Never Used  . Alcohol Use: No    Review of Systems  All other systems reviewed and are negative.   Allergies  Review of patient's allergies indicates no known allergies.  Home Medications   Prior to Admission medications   Medication Sig Start Date End Date Taking? Authorizing Provider  Sulfamethoxazole-Trimethoprim (BACTRIM DS PO) Take by mouth.   Yes Historical Provider, MD  AXIRON 30 MG/ACT SOLN  12/05/13   Historical Provider, MD  predniSONE (DELTASONE) 20 MG tablet  02/16/14   Historical Provider, MD  traMADol (ULTRAM) 50 MG tablet Take 1 tablet (50 mg total) by mouth every 6 (six) hours  as needed. 02/23/15   Okey Regal, PA-C  traMADol (ULTRAM) 50 MG tablet Take 1 tablet (50 mg total) by mouth every 6 (six) hours as needed. 02/23/15   Yuli Lanigan, PA-C   BP 137/89 mmHg  Pulse 60  Temp(Src) 98.5 F (36.9 C) (Oral)  Resp 18  Ht 5\' 10"  (1.778 m)  Wt 85.276 kg  BMI 26.98 kg/m2  SpO2 99%   Physical Exam  Constitutional: He is oriented to person, place, and time. He appears well-developed and well-nourished.  HENT:  Head: Normocephalic and atraumatic.  Eyes: Conjunctivae are normal. Pupils are equal, round, and reactive to light. Right eye exhibits no discharge. Left eye exhibits no discharge. No scleral icterus.  Neck: Normal range of motion. No JVD present. No tracheal deviation present.  Pulmonary/Chest: Effort normal. No stridor.  Neurological: He is alert and oriented to person, place, and time. Coordination normal.  Skin:  Abscess to the chin approximately 1.5 cm, area of fluctuance noted with purulent drainage and skin breakdown. No surrounding redness, warmth to touch. Neck was supple full active range of motion, no signs of the space infection  Psychiatric: He has a normal mood and affect. His behavior is normal. Judgment and thought content normal.  Nursing note and vitals reviewed.     ED Course  Procedures (including critical care time)  INCISION AND DRAINAGE Performed by: Elmer Ramp Consent: Verbal consent obtained. Risks and  benefits: risks, benefits and alternatives were discussed Type: abscess  Body area: Chin  Anesthesia: local infiltration  Incision was made with a scalpel.  Local anesthetic: lidocaine 2% with epinephrine  Anesthetic total: 4 ml  Complexity: complex Blunt dissection to break up loculations  Drainage: purulent  Drainage amount: 2   Packing material: None   Patient tolerance: Patient tolerated the procedure well with no immediate complications.   Labs Review Labs Reviewed - No data to  display  Imaging Review No results found. I have personally reviewed and evaluated these images and lab results as part of my medical decision-making.   EKG Interpretation None      MDM   Final diagnoses:  Abscess    Labs:  Imaging:  Consults:  Therapeutics: Tramadol  Discharge Meds: Tramadol  Assessment/Plan: 59 year old male presents today with abscess to his chin, I&D performed here without complication. No deep space involvement noted, patient is instructed to continue taking his oral antibiotics for 7 days, return in 3 days for reevaluation, sooner as needed. Patient has no signs of systemic involvement, he was given strict return precautions, wound care instructions, follow-up information. He verbalizes understanding and agreement to today's plan and had no further questions or concerns at time of discharge         Okey Regal, PA-C 02/23/15 2057  Sherwood Gambler, MD 02/26/15 [phone]

## 2015-02-23 NOTE — ED Notes (Signed)
Pt states he does not want to wait and will go to another hospital.

## 2015-02-23 NOTE — Discharge Instructions (Signed)
Incision and Drainage Incision and drainage is a procedure in which a sac-like structure (cystic structure) is opened and drained. The area to be drained usually contains material such as pus, fluid, or blood.  LET YOUR CAREGIVER KNOW ABOUT:   Allergies to medicine.  Medicines taken, including vitamins, herbs, eyedrops, over-the-counter medicines, and creams.  Use of steroids (by mouth or creams).  Previous problems with anesthetics or numbing medicines.  History of bleeding problems or blood clots.  Previous surgery.  Other health problems, including diabetes and kidney problems.  Possibility of pregnancy, if this applies. RISKS AND COMPLICATIONS  Pain.  Bleeding.  Scarring.  Infection. BEFORE THE PROCEDURE  You may need to have an ultrasound or other imaging tests to see how large or deep your cystic structure is. Blood tests may also be used to determine if you have an infection or how severe the infection is. You may need to have a tetanus shot. PROCEDURE  The affected area is cleaned with a cleaning fluid. The cyst area will then be numbed with a medicine (local anesthetic). A small incision will be made in the cystic structure. A syringe or catheter may be used to drain the contents of the cystic structure, or the contents may be squeezed out. The area will then be flushed with a cleansing solution. After cleansing the area, it is often gently packed with a gauze or another wound dressing. Once it is packed, it will be covered with gauze and tape or some other type of wound dressing. AFTER THE PROCEDURE   Often, you will be allowed to go home right after the procedure.  You may be given antibiotic medicine to prevent or heal an infection.  If the area was packed with gauze or some other wound dressing, you will likely need to come back in 1 to 2 days to get it removed.  The area should heal in about 14 days.   This information is not intended to replace advice given  to you by your health care provider. Make sure you discuss any questions you have with your health care provider.   Document Released: 08/16/2000 Document Revised: 08/22/2011 Document Reviewed: 04/17/2011 Elsevier Interactive Patient Education [employer].  Please read attached information. If you experience any new or worsening signs or symptoms please return to the emergency room for evaluation. Please follow-up with your primary care provider or specialist as discussed. Please use medication prescribed only as directed and discontinue taking if you have any concerning signs or symptoms.

## 2015-02-26 DIAGNOSIS — N320 Bladder-neck obstruction: Secondary | ICD-10-CM | POA: Insufficient documentation

## 2015-02-26 DIAGNOSIS — R3915 Urgency of urination: Secondary | ICD-10-CM | POA: Insufficient documentation

## 2015-02-26 DIAGNOSIS — R350 Frequency of micturition: Secondary | ICD-10-CM | POA: Insufficient documentation

## 2015-09-06 DIAGNOSIS — E291 Testicular hypofunction: Secondary | ICD-10-CM

## 2015-09-06 HISTORY — DX: Testicular hypofunction: E29.1

## 2016-02-16 ENCOUNTER — Encounter (INDEPENDENT_AMBULATORY_CARE_PROVIDER_SITE_OTHER): Payer: Self-pay | Admitting: Specialist

## 2016-02-16 ENCOUNTER — Ambulatory Visit (INDEPENDENT_AMBULATORY_CARE_PROVIDER_SITE_OTHER): Payer: 59 | Admitting: Specialist

## 2016-02-16 ENCOUNTER — Ambulatory Visit (INDEPENDENT_AMBULATORY_CARE_PROVIDER_SITE_OTHER): Payer: 59

## 2016-02-16 VITALS — BP 125/80 | HR 54 | Ht 69.0 in | Wt 189.0 lb

## 2016-02-16 DIAGNOSIS — M5441 Lumbago with sciatica, right side: Secondary | ICD-10-CM

## 2016-02-16 DIAGNOSIS — G8929 Other chronic pain: Secondary | ICD-10-CM | POA: Diagnosis not present

## 2016-02-16 DIAGNOSIS — M4316 Spondylolisthesis, lumbar region: Secondary | ICD-10-CM

## 2016-02-16 DIAGNOSIS — Z981 Arthrodesis status: Secondary | ICD-10-CM

## 2016-02-16 MED ORDER — TRAMADOL HCL 50 MG PO TABS: 50.0000 mg | ORAL_TABLET | Freq: Four times a day (QID) | ORAL | 0 refills | Status: DC | PRN
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
## 2016-02-16 NOTE — Patient Instructions (Signed)
Avoid bending, stooping and avoid lifting weights greater than 10 lbs. Avoid prolong standing and walking. Avoid frequent bending and stooping  No lifting greater than 10 lbs. May use ice or moist heat for pain. Weight loss is of benefit.

## 2016-05-30 DIAGNOSIS — J029 Acute pharyngitis, unspecified: Secondary | ICD-10-CM | POA: Diagnosis not present

## 2016-06-23 DIAGNOSIS — H40013 Open angle with borderline findings, low risk, bilateral: Secondary | ICD-10-CM | POA: Diagnosis not present

## 2016-07-14 DIAGNOSIS — S60221A Contusion of right hand, initial encounter: Secondary | ICD-10-CM | POA: Diagnosis not present

## 2016-07-14 DIAGNOSIS — S6991XA Unspecified injury of right wrist, hand and finger(s), initial encounter: Secondary | ICD-10-CM | POA: Diagnosis not present

## 2016-07-14 DIAGNOSIS — W010XXA Fall on same level from slipping, tripping and stumbling without subsequent striking against object, initial encounter: Secondary | ICD-10-CM | POA: Diagnosis not present

## 2016-07-14 DIAGNOSIS — M79641 Pain in right hand: Secondary | ICD-10-CM | POA: Diagnosis not present

## 2016-07-20 ENCOUNTER — Observation Stay (HOSPITAL_BASED_OUTPATIENT_CLINIC_OR_DEPARTMENT_OTHER)
Admission: EM | Admit: 2016-07-20 | Discharge: 2016-07-21 | Disposition: A | Discharge status: Home / Self Care | Payer: 59 | Attending: Surgery | Admitting: Surgery

## 2016-07-20 ENCOUNTER — Observation Stay (HOSPITAL_COMMUNITY): Payer: 59 | Admitting: Anesthesiology

## 2016-07-20 ENCOUNTER — Encounter (HOSPITAL_COMMUNITY)
Admission: EM | Disposition: A | Discharge status: Home / Self Care | Payer: Self-pay | Source: Home / Self Care | Attending: Emergency Medicine

## 2016-07-20 ENCOUNTER — Encounter (HOSPITAL_BASED_OUTPATIENT_CLINIC_OR_DEPARTMENT_OTHER): Payer: Self-pay | Admitting: Emergency Medicine

## 2016-07-20 ENCOUNTER — Emergency Department (HOSPITAL_BASED_OUTPATIENT_CLINIC_OR_DEPARTMENT_OTHER): Payer: 59

## 2016-07-20 DIAGNOSIS — Z9889 Other specified postprocedural states: Secondary | ICD-10-CM | POA: Diagnosis not present

## 2016-07-20 DIAGNOSIS — Z808 Family history of malignant neoplasm of other organs or systems: Secondary | ICD-10-CM | POA: Insufficient documentation

## 2016-07-20 DIAGNOSIS — K7689 Other specified diseases of liver: Secondary | ICD-10-CM | POA: Diagnosis not present

## 2016-07-20 DIAGNOSIS — Z823 Family history of stroke: Secondary | ICD-10-CM | POA: Diagnosis not present

## 2016-07-20 DIAGNOSIS — Z820 Family history of epilepsy and other diseases of the nervous system: Secondary | ICD-10-CM | POA: Insufficient documentation

## 2016-07-20 DIAGNOSIS — Z79899 Other long term (current) drug therapy: Secondary | ICD-10-CM | POA: Diagnosis not present

## 2016-07-20 DIAGNOSIS — K353 Acute appendicitis with localized peritonitis: Secondary | ICD-10-CM | POA: Diagnosis not present

## 2016-07-20 DIAGNOSIS — K3580 Unspecified acute appendicitis: Secondary | ICD-10-CM | POA: Diagnosis not present

## 2016-07-20 DIAGNOSIS — N3281 Overactive bladder: Secondary | ICD-10-CM | POA: Insufficient documentation

## 2016-07-20 DIAGNOSIS — Z8249 Family history of ischemic heart disease and other diseases of the circulatory system: Secondary | ICD-10-CM | POA: Insufficient documentation

## 2016-07-20 DIAGNOSIS — R1110 Vomiting, unspecified: Secondary | ICD-10-CM | POA: Diagnosis not present

## 2016-07-20 HISTORY — PX: LAPAROSCOPIC APPENDECTOMY: SHX408

## 2016-07-20 LAB — COMPREHENSIVE METABOLIC PANEL
ALK PHOS: 56 U/L (ref 38–126)
ALT: 23 U/L (ref 17–63)
AST: 29 U/L (ref 15–41)
Albumin: 4 g/dL (ref 3.5–5.0)
Anion gap: 8 (ref 5–15)
BUN: 18 mg/dL (ref 6–20)
CALCIUM: 8.8 mg/dL — AB (ref 8.9–10.3)
CHLORIDE: 103 mmol/L (ref 101–111)
CO2: 26 mmol/L (ref 22–32)
CREATININE: 0.9 mg/dL (ref 0.61–1.24)
GFR calc Af Amer: >60 mL/min (ref >60)
Glucose, Bld: 149 mg/dL — ABNORMAL HIGH (ref 65–99)
Potassium: 3.5 mmol/L (ref 3.5–5.1)
Sodium: 137 mmol/L (ref 135–145)
Total Bilirubin: 0.5 mg/dL (ref 0.3–1.2)
Total Protein: 6.5 g/dL (ref 6.5–8.1)

## 2016-07-20 LAB — CBC WITH DIFFERENTIAL/PLATELET
BASOS ABS: 0 10*3/uL (ref 0.0–0.1)
Basophils Relative: 0 %
EOS PCT: 1 %
Eosinophils Absolute: 0 10*3/uL (ref 0.0–0.7)
HCT: 38.1 % — ABNORMAL LOW (ref 39.0–52.0)
HEMOGLOBIN: 13.4 g/dL (ref 13.0–17.0)
LYMPHS ABS: 0.7 10*3/uL (ref 0.7–4.0)
LYMPHS PCT: 11 %
MCH: 29.1 pg (ref 26.0–34.0)
MCHC: 35.2 g/dL (ref 30.0–36.0)
MCV: 82.6 fL (ref 78.0–100.0)
Monocytes Absolute: 0.6 10*3/uL (ref 0.1–1.0)
Monocytes Relative: 10 %
NEUTROS PCT: 78 %
Neutro Abs: 5 10*3/uL (ref 1.7–7.7)
PLATELETS: 235 10*3/uL (ref 150–400)
RBC: 4.61 MIL/uL (ref 4.22–5.81)
RDW: 12.9 % (ref 11.5–15.5)
WBC: 6.3 10*3/uL (ref 4.0–10.5)

## 2016-07-20 LAB — LIPASE, BLOOD: LIPASE: 21 U/L (ref 11–51)

## 2016-07-20 SURGERY — APPENDECTOMY, LAPAROSCOPIC
Anesthesia: General | Site: Abdomen

## 2016-07-20 MED ORDER — ACETAMINOPHEN 325 MG PO TABS
650.0000 mg | ORAL_TABLET | Freq: Four times a day (QID) | ORAL | Status: DC | PRN
  Administered 2016-07-20: 650 mg via ORAL
  Filled 2016-07-20: qty 2

## 2016-07-20 MED ORDER — PROPOFOL 10 MG/ML IV BOLUS
INTRAVENOUS | Status: DC | PRN
  Administered 2016-07-20: 170 mg via INTRAVENOUS
  Administered 2016-07-20: 30 mg via INTRAVENOUS

## 2016-07-20 MED ORDER — ONDANSETRON HCL 4 MG/2ML IJ SOLN: 4.0000 mg | Freq: Four times a day (QID) | INTRAMUSCULAR | Status: DC | PRN

## 2016-07-20 MED ORDER — PROMETHAZINE HCL 25 MG/ML IJ SOLN: 6.2500 mg | INTRAMUSCULAR | Status: DC | PRN

## 2016-07-20 MED ORDER — BUPIVACAINE HCL (PF) 0.25 % IJ SOLN
INTRAMUSCULAR | Status: AC
  Filled 2016-07-20: qty 30

## 2016-07-20 MED ORDER — SUCCINYLCHOLINE CHLORIDE 200 MG/10ML IV SOSY
PREFILLED_SYRINGE | INTRAVENOUS | Status: AC
Start: 2016-07-20 — End: 2016-07-20
  Filled 2016-07-20: qty 10

## 2016-07-20 MED ORDER — FENTANYL CITRATE (PF) 100 MCG/2ML IJ SOLN
INTRAMUSCULAR | Status: AC
  Filled 2016-07-20: qty 2

## 2016-07-20 MED ORDER — ACETAMINOPHEN 650 MG RE SUPP: 650.0000 mg | Freq: Four times a day (QID) | RECTAL | Status: DC | PRN

## 2016-07-20 MED ORDER — SUGAMMADEX SODIUM 200 MG/2ML IV SOLN
INTRAVENOUS | Status: DC | PRN
  Administered 2016-07-20: 200 mg via INTRAVENOUS

## 2016-07-20 MED ORDER — LACTATED RINGERS IV SOLN
INTRAVENOUS | Status: DC | PRN
  Administered 2016-07-20 (×2): via INTRAVENOUS

## 2016-07-20 MED ORDER — LIDOCAINE 2% (20 MG/ML) 5 ML SYRINGE
INTRAMUSCULAR | Status: AC
  Filled 2016-07-20: qty 5

## 2016-07-20 MED ORDER — MIDAZOLAM HCL 2 MG/2ML IJ SOLN
INTRAMUSCULAR | Status: AC
  Filled 2016-07-20: qty 2

## 2016-07-20 MED ORDER — FENTANYL CITRATE (PF) 100 MCG/2ML IJ SOLN
100.0000 ug | Freq: Once | INTRAMUSCULAR | Status: AC
  Administered 2016-07-20: 100 ug via INTRAVENOUS
  Filled 2016-07-20: qty 2

## 2016-07-20 MED ORDER — ONDANSETRON 4 MG PO TBDP: 4.0000 mg | ORAL_TABLET | Freq: Four times a day (QID) | ORAL | Status: DC | PRN

## 2016-07-20 MED ORDER — POTASSIUM CHLORIDE IN NACL 40-0.9 MEQ/L-% IV SOLN
INTRAVENOUS | Status: DC
  Administered 2016-07-20: 125 mL/h via INTRAVENOUS
  Filled 2016-07-20: qty 1000

## 2016-07-20 MED ORDER — ONDANSETRON HCL 4 MG/2ML IJ SOLN
4.0000 mg | Freq: Once | INTRAMUSCULAR | Status: AC
  Administered 2016-07-20: 4 mg via INTRAVENOUS
  Filled 2016-07-20: qty 2

## 2016-07-20 MED ORDER — ROCURONIUM BROMIDE 50 MG/5ML IV SOSY
PREFILLED_SYRINGE | INTRAVENOUS | Status: AC
  Filled 2016-07-20: qty 5

## 2016-07-20 MED ORDER — ONDANSETRON HCL 4 MG/2ML IJ SOLN: 4.0000 mg | Freq: Once | INTRAMUSCULAR | Status: DC | PRN

## 2016-07-20 MED ORDER — PROPOFOL 10 MG/ML IV BOLUS
INTRAVENOUS | Status: AC
  Filled 2016-07-20: qty 20

## 2016-07-20 MED ORDER — MORPHINE SULFATE (PF) 4 MG/ML IV SOLN: 1.0000 mg | INTRAVENOUS | Status: DC | PRN

## 2016-07-20 MED ORDER — ONDANSETRON HCL 4 MG/2ML IJ SOLN
INTRAMUSCULAR | Status: DC | PRN
  Administered 2016-07-20: 4 mg via INTRAVENOUS

## 2016-07-20 MED ORDER — HYDROMORPHONE HCL 1 MG/ML IJ SOLN: 1.0000 mg | INTRAMUSCULAR | Status: DC | PRN

## 2016-07-20 MED ORDER — SUFENTANIL CITRATE 50 MCG/ML IV SOLN
INTRAVENOUS | Status: AC
  Filled 2016-07-20: qty 1

## 2016-07-20 MED ORDER — BUPIVACAINE HCL (PF) 0.25 % IJ SOLN
INTRAMUSCULAR | Status: DC | PRN
  Administered 2016-07-20: 20 mL

## 2016-07-20 MED ORDER — METRONIDAZOLE IN NACL 5-0.79 MG/ML-% IV SOLN: 500.0000 mg | Freq: Three times a day (TID) | INTRAVENOUS | Status: DC

## 2016-07-20 MED ORDER — LIDOCAINE 2% (20 MG/ML) 5 ML SYRINGE
INTRAMUSCULAR | Status: DC | PRN
  Administered 2016-07-20: 100 mg via INTRAVENOUS

## 2016-07-20 MED ORDER — HYDROCODONE-ACETAMINOPHEN 5-325 MG PO TABS: 1.0000 | ORAL_TABLET | ORAL | Status: DC | PRN

## 2016-07-20 MED ORDER — DIPHENHYDRAMINE HCL 50 MG/ML IJ SOLN: 25.0000 mg | Freq: Four times a day (QID) | INTRAMUSCULAR | Status: DC | PRN

## 2016-07-20 MED ORDER — ONDANSETRON HCL 4 MG/2ML IJ SOLN
INTRAMUSCULAR | Status: AC
  Filled 2016-07-20: qty 2

## 2016-07-20 MED ORDER — LACTATED RINGERS IR SOLN
Status: DC | PRN
  Administered 2016-07-20: 1000 mL

## 2016-07-20 MED ORDER — CIPROFLOXACIN IN D5W 400 MG/200ML IV SOLN: 400.0000 mg | Freq: Two times a day (BID) | INTRAVENOUS | Status: DC

## 2016-07-20 MED ORDER — 0.9 % SODIUM CHLORIDE (POUR BTL) OPTIME
TOPICAL | Status: DC | PRN
  Administered 2016-07-20: 1000 mL

## 2016-07-20 MED ORDER — DEXAMETHASONE SODIUM PHOSPHATE 10 MG/ML IJ SOLN
INTRAMUSCULAR | Status: DC | PRN
  Administered 2016-07-20: 10 mg via INTRAVENOUS

## 2016-07-20 MED ORDER — MIDAZOLAM HCL 2 MG/2ML IJ SOLN
INTRAMUSCULAR | Status: DC | PRN
  Administered 2016-07-20: 2 mg via INTRAVENOUS

## 2016-07-20 MED ORDER — FENTANYL CITRATE (PF) 100 MCG/2ML IJ SOLN
100.0000 ug | Freq: Once | INTRAMUSCULAR | Status: AC
  Administered 2016-07-20: 100 ug via INTRAVENOUS

## 2016-07-20 MED ORDER — CEFTRIAXONE SODIUM 2 G IJ SOLR
2.0000 g | Freq: Once | INTRAMUSCULAR | Status: AC
  Administered 2016-07-20: 2 g via INTRAVENOUS
  Filled 2016-07-20: qty 2

## 2016-07-20 MED ORDER — SODIUM CHLORIDE 0.9 % IJ SOLN
INTRAMUSCULAR | Status: AC
  Filled 2016-07-20: qty 10

## 2016-07-20 MED ORDER — SUGAMMADEX SODIUM 200 MG/2ML IV SOLN
INTRAVENOUS | Status: AC
  Filled 2016-07-20: qty 2

## 2016-07-20 MED ORDER — HYDROMORPHONE HCL 1 MG/ML IJ SOLN
0.2500 mg | INTRAMUSCULAR | Status: DC | PRN
  Administered 2016-07-20 (×2): 0.5 mg via INTRAVENOUS

## 2016-07-20 MED ORDER — ROCURONIUM BROMIDE 10 MG/ML (PF) SYRINGE
PREFILLED_SYRINGE | INTRAVENOUS | Status: DC | PRN
  Administered 2016-07-20: 30 mg via INTRAVENOUS

## 2016-07-20 MED ORDER — DEXTROSE 5 % IV SOLN: 2.0000 g | INTRAVENOUS | Status: DC

## 2016-07-20 MED ORDER — SUCCINYLCHOLINE CHLORIDE 200 MG/10ML IV SOSY
PREFILLED_SYRINGE | INTRAVENOUS | Status: DC | PRN
  Administered 2016-07-20: 120 mg via INTRAVENOUS

## 2016-07-20 MED ORDER — DEXAMETHASONE SODIUM PHOSPHATE 10 MG/ML IJ SOLN
INTRAMUSCULAR | Status: AC
  Filled 2016-07-20: qty 1

## 2016-07-20 MED ORDER — METRONIDAZOLE IN NACL 5-0.79 MG/ML-% IV SOLN
500.0000 mg | Freq: Three times a day (TID) | INTRAVENOUS | Status: DC
  Administered 2016-07-20 – 2016-07-21 (×3): 500 mg via INTRAVENOUS
  Filled 2016-07-20 (×2): qty 100

## 2016-07-20 MED ORDER — SUFENTANIL CITRATE 50 MCG/ML IV SOLN
INTRAVENOUS | Status: DC | PRN
  Administered 2016-07-20 (×2): 5 ug via INTRAVENOUS
  Administered 2016-07-20: 10 ug via INTRAVENOUS

## 2016-07-20 MED ORDER — FENTANYL CITRATE (PF) 100 MCG/2ML IJ SOLN: 50.0000 ug | INTRAMUSCULAR | Status: DC | PRN

## 2016-07-20 MED ORDER — DIPHENHYDRAMINE HCL 25 MG PO CAPS: 25.0000 mg | ORAL_CAPSULE | Freq: Four times a day (QID) | ORAL | Status: DC | PRN

## 2016-07-20 MED ORDER — HYDROMORPHONE HCL 1 MG/ML IJ SOLN
INTRAMUSCULAR | Status: AC
  Filled 2016-07-20: qty 1

## 2016-07-20 MED ORDER — MORPHINE SULFATE (PF) 4 MG/ML IV SOLN
4.0000 mg | Freq: Once | INTRAVENOUS | Status: AC
  Administered 2016-07-20: 4 mg via INTRAVENOUS
  Filled 2016-07-20: qty 1

## 2016-07-20 MED ORDER — IOPAMIDOL (ISOVUE-300) INJECTION 61%
100.0000 mL | Freq: Once | INTRAVENOUS | Status: AC | PRN
  Administered 2016-07-20: 100 mL via INTRAVENOUS

## 2016-07-20 MED ORDER — DEXTROSE 5 % IV SOLN
2.0000 g | INTRAVENOUS | Status: DC
  Administered 2016-07-21: 2 g via INTRAVENOUS
  Filled 2016-07-20: qty 2

## 2016-07-20 MED ORDER — METRONIDAZOLE IN NACL 5-0.79 MG/ML-% IV SOLN
500.0000 mg | Freq: Once | INTRAVENOUS | Status: AC
  Administered 2016-07-20: 500 mg via INTRAVENOUS
  Filled 2016-07-20: qty 100

## 2016-07-20 MED ORDER — PANTOPRAZOLE SODIUM 40 MG IV SOLR
40.0000 mg | Freq: Once | INTRAVENOUS | Status: AC
  Administered 2016-07-20: 40 mg via INTRAVENOUS
  Filled 2016-07-20: qty 40

## 2016-07-20 MED ORDER — KCL IN DEXTROSE-NACL 20-5-0.45 MEQ/L-%-% IV SOLN
INTRAVENOUS | Status: DC
  Filled 2016-07-20: qty 1000

## 2016-07-20 SURGICAL SUPPLY — 37 items
APPLIER CLIP ROT 10 11.4 M/L (STAPLE)
APR CLP MED LRG 11.4X10 (STAPLE)
BAG SPEC RTRVL LRG 6X4 10 (ENDOMECHANICALS) ×1
CHLORAPREP W/TINT 26ML (MISCELLANEOUS) ×3 IMPLANT
CLIP APPLIE ROT 10 11.4 M/L (STAPLE) IMPLANT
CLOSURE WOUND 1/2 X4 (GAUZE/BANDAGES/DRESSINGS) ×1
COVER SURGICAL LIGHT HANDLE (MISCELLANEOUS) ×3 IMPLANT
CUTTER FLEX LINEAR 45M (STAPLE) ×2 IMPLANT
DECANTER SPIKE VIAL GLASS SM (MISCELLANEOUS) ×3 IMPLANT
DRAPE LAPAROSCOPIC ABDOMINAL (DRAPES) ×3 IMPLANT
ELECT COAG MONOPOLAR (ELECTROSURGICAL) ×3
ELECT REM PT RETURN 15FT ADLT (MISCELLANEOUS) ×3 IMPLANT
ELECTRODE COAG MONOPOLAR (ELECTROSURGICAL) IMPLANT
ENDOLOOP SUT PDS II  0 18 (SUTURE)
ENDOLOOP SUT PDS II 0 18 (SUTURE) IMPLANT
GAUZE SPONGE 2X2 8PLY STRL LF (GAUZE/BANDAGES/DRESSINGS) IMPLANT
GLOVE SURG ORTHO 8.0 STRL STRW (GLOVE) ×3 IMPLANT
GOWN STRL REUS W/TWL XL LVL3 (GOWN DISPOSABLE) ×6 IMPLANT
KIT BASIN OR (CUSTOM PROCEDURE TRAY) ×3 IMPLANT
POUCH SPECIMEN RETRIEVAL 10MM (ENDOMECHANICALS) ×3 IMPLANT
RELOAD 45 VASCULAR/THIN (ENDOMECHANICALS) IMPLANT
RELOAD STAPLE 45 2.5 WHT GRN (ENDOMECHANICALS) IMPLANT
RELOAD STAPLE 45 3.5 BLU ETS (ENDOMECHANICALS) IMPLANT
RELOAD STAPLE TA45 3.5 REG BLU (ENDOMECHANICALS) ×3 IMPLANT
SET IRRIG TUBING LAPAROSCOPIC (IRRIGATION / IRRIGATOR) ×2 IMPLANT
SHEARS HARMONIC ACE PLUS 36CM (ENDOMECHANICALS) ×3 IMPLANT
SPONGE GAUZE 2X2 STER 10/PKG (GAUZE/BANDAGES/DRESSINGS) ×2
STRIP CLOSURE SKIN 1/2X4 (GAUZE/BANDAGES/DRESSINGS) ×2 IMPLANT
SUT MNCRL AB 4-0 PS2 18 (SUTURE) ×3 IMPLANT
TAPE PAPER 1X10 WHT MICROPORE (GAUZE/BANDAGES/DRESSINGS) ×2 IMPLANT
TOWEL OR 17X26 10 PK STRL BLUE (TOWEL DISPOSABLE) ×3 IMPLANT
TOWEL OR NON WOVEN STRL DISP B (DISPOSABLE) ×3 IMPLANT
TRAY FOLEY W/METER SILVER 14FR (SET/KITS/TRAYS/PACK) IMPLANT
TRAY FOLEY W/METER SILVER 16FR (SET/KITS/TRAYS/PACK) IMPLANT
TRAY LAPAROSCOPIC (CUSTOM PROCEDURE TRAY) ×3 IMPLANT
TROCAR XCEL BLUNT TIP 100MML (ENDOMECHANICALS) ×3 IMPLANT
TROCAR XCEL NON-BLD 11X100MML (ENDOMECHANICALS) ×3 IMPLANT

## 2016-07-20 NOTE — ED Notes (Signed)
Patient has been wiped down in chlorhexidine wipes

## 2016-07-20 NOTE — ED Notes (Signed)
Patient transported to CT 

## 2016-07-20 NOTE — ED Provider Notes (Signed)
[facility] Provider Note: Georgena Spurling, MD, FACEP  CSN: [id number] MRN: [id number] ARRIVAL: 07/20/16 at [place]: [place]  Abdominal Pain   HISTORY OF PRESENT ILLNESS  Dwayne Acosta is a 61 y.o. male who has had the gradual onset of epigastric and right upper quadrant abdominal pain since yesterday afternoon about 3 PM. The pain became much more severe about 10 PM yesterday evening. He rates his pain as about an 8 out of 10. It is been associated with nausea and 6 episodes of vomiting. He has also had increased output of stool but without diarrhea. He has not had a fever. The pain is not significantly worsened with movement or palpation.   Past Medical History:  Diagnosis Date  . Overactive bladder     Past Surgical History:  Procedure Laterality Date  . BACK SURGERY    . FOOT SURGERY    . ROTATOR CUFF REPAIR Bilateral     Family History  Problem Relation Age of Onset  . High blood pressure Mother   . Heart attack Father   . Parkinson's disease Father   . Stroke Paternal Grandmother   . Bone cancer Maternal Grandfather     Social History  Substance Use Topics  . Smoking status: Never Smoker  . Smokeless tobacco: Never Used  . Alcohol use 0.0 oz/week     Comment: occ    Prior to Admission medications   Medication Sig Start Date End Date Taking? Authorizing Provider  solifenacin (VESICARE) 10 MG tablet Take 10 mg by mouth daily.   Yes [provider]  traMADol (ULTRAM) 50 MG tablet Take 1 tablet (50 mg total) by mouth every 6 (six) hours as needed. 02/23/15   Hedges, Dellis Filbert, PA-C  traMADol (ULTRAM) 50 MG tablet Take 1 tablet (50 mg total) by mouth every 6 (six) hours as needed for moderate pain. 02/16/16   Jessy Oto, MD    Allergies Patient has no known allergies.   REVIEW OF SYSTEMS  Negative except as noted here or in the History of Present Illness.   PHYSICAL EXAMINATION  Initial Vital Signs Blood  pressure 120/84, pulse 64, temperature 98.6 F (37 C), temperature source Oral, resp. rate 20, height 5\' 9"  (1.753 m), weight 187 lb (84.8 kg), SpO2 100 %.  Examination General: Well-developed, well-nourished male in no acute distress; appearance consistent with age of record HENT: normocephalic; atraumatic Eyes: pupils equal, round and reactive to light; extraocular muscles intact Neck: supple Heart: regular rate and rhythm Lungs: clear to auscultation bilaterally Abdomen: soft; nondistended; mild epigastric and right upper quadrant tenderness without Murphy sign; no masses or hepatosplenomegaly; bowel sounds present Extremities: No deformity; full range of motion; pulses normal Neurologic: Awake, alert and oriented; motor function intact in all extremities and symmetric; no facial droop Skin: Warm and dry Psychiatric: Flat affect   RESULTS  Summary of this visit's results, reviewed by myself:   EKG Interpretation  Date/Time:    Ventricular Rate:    PR Interval:    QRS Duration:   QT Interval:    QTC Calculation:   R Axis:     Text Interpretation:        Laboratory Studies: Results for orders placed or performed during the hospital encounter of 07/20/16 (from the past 24 hour(s))  CBC with Differential     Status: Abnormal   Collection Time: 07/20/16  1:56 AM  Result Value Ref Range   WBC 6.3 4.0 - 10.5  K/uL   RBC 4.61 4.22 - 5.81 MIL/uL   Hemoglobin 13.4 13.0 - 17.0 g/dL   HCT 38.1 (L) 39.0 - 52.0 %   MCV 82.6 78.0 - 100.0 fL   MCH 29.1 26.0 - 34.0 pg   MCHC 35.2 30.0 - 36.0 g/dL   RDW 12.9 11.5 - 15.5 %   Platelets 235 150 - 400 K/uL   Neutrophils Relative % 78 %   Neutro Abs 5.0 1.7 - 7.7 K/uL   Lymphocytes Relative 11 %   Lymphs Abs 0.7 0.7 - 4.0 K/uL   Monocytes Relative 10 %   Monocytes Absolute 0.6 0.1 - 1.0 K/uL   Eosinophils Relative 1 %   Eosinophils Absolute 0.0 0.0 - 0.7 K/uL   Basophils Relative 0 %   Basophils Absolute 0.0 0.0 - 0.1 K/uL    Comprehensive metabolic panel     Status: Abnormal   Collection Time: 07/20/16  1:56 AM  Result Value Ref Range   Sodium 137 135 - 145 mmol/L   Potassium 3.5 3.5 - 5.1 mmol/L   Chloride 103 101 - 111 mmol/L   CO2 26 22 - 32 mmol/L   Glucose, Bld 149 (H) 65 - 99 mg/dL   BUN 18 6 - 20 mg/dL   Creatinine, Ser 0.90 0.61 - 1.24 mg/dL   Calcium 8.8 (L) 8.9 - 10.3 mg/dL   Total Protein 6.5 6.5 - 8.1 g/dL   Albumin 4.0 3.5 - 5.0 g/dL   AST 29 15 - 41 U/L   ALT 23 17 - 63 U/L   Alkaline Phosphatase 56 38 - 126 U/L   Total Bilirubin 0.5 0.3 - 1.2 mg/dL   GFR calc non Af Amer >60 >60 mL/min   GFR calc Af Amer >60 >60 mL/min   Anion gap 8 5 - 15  Lipase, blood     Status: None   Collection Time: 07/20/16  1:56 AM  Result Value Ref Range   Lipase 21 11 - 51 U/L   Imaging Studies: Ct Abdomen Pelvis W Contrast  Result Date: 07/20/2016 CLINICAL DATA:  Epigastric and right upper quadrant pain. Nausea and vomiting. EXAM: CT ABDOMEN AND PELVIS WITH CONTRAST TECHNIQUE: Multidetector CT imaging of the abdomen and pelvis was performed using the standard protocol following bolus administration of intravenous contrast. CONTRAST:  180mL ISOVUE-300 IOPAMIDOL (ISOVUE-300) INJECTION 61% COMPARISON:  CT 10/13/2011 FINDINGS: Lower chest: Mild dependent atelectasis at the lung bases. No consolidation. No pleural fluid. The heart appears normal in size. Hepatobiliary: Simple cyst in the right lobe of the liver. Hypodense lesion in the left lobe 2.9 x 2.8 cm, unchanged in size from prior exam. No new hepatic lesion. Gallbladder physiologically distended, no calcified stone. No biliary dilatation. Pancreas: No ductal dilatation or inflammation. Spleen: Normal in size without focal abnormality. Adrenals/Urinary Tract: Subcentimeter nodularity of the right adrenal gland. Left adrenal gland is normal. No hydronephrosis or perinephric edema. Homogeneous enhancement and symmetric excretion on delayed phase imaging. Tiny  subcentimeter cortical hypodensity in the left kidney is too small to accurately characterize. Urinary bladder is physiologically distended, no bladder wall thickening. Stomach/Bowel: The appendix is dilated measuring 12 mm, fluid-filled with faint periappendiceal soft tissue stranding. No perforation or abscess. High-density enteric contrast mixing with liquid stool in the cecum and ascending colon. Question of dependent liquid stool with adjacent contrast versus cecal wall thickening. Moderate stool in the transverse colon, the distal colon is decompressed. Enteric contrast in the distal esophagus. Stomach is mildly distended with contrast.  No small bowel wall thickening, inflammation or obstruction, enteric contrast reaches the cecum. Vascular/Lymphatic: No significant vascular findings are present. No enlarged abdominal or pelvic lymph nodes. Reproductive: Prostate is unremarkable. Other: No free air or free fluid.  No intra-abdominal abscess. Musculoskeletal: Posterior L5-S1 fusion with interbody spacer. Adjacent level degenerative change at L4-L5. There are no acute or suspicious osseous abnormalities. IMPRESSION: 1. Findings consistent with early acute appendicitis. No perforation or abscess. 2. Enteric contrast mixes with stool in the cecum and ascending colon, difficult to exclude cecal wall thickening. Recommend up-to-date colonoscopy to exclude colonic malignancy. 3. Simple cyst in the liver. An additional 2.9 cm hypodense lesion is incompletely characterized, however unchanged in size since 2013 and likely benign hemangioma. 4. Enteric contrast in the distal esophagus Ob seen with gastroesophageal reflux or delayed transit. Electronically Signed   By: Jeb Levering M.D.   On: 07/20/2016 06:12    ED COURSE  Nursing notes and initial vitals signs, including pulse oximetry, reviewed.  Vitals:   07/20/16 0130 07/20/16 0408 07/20/16 0627  BP: 120/84 126/75 131/86  Pulse: 64 63 63  Resp: 20 18 20     Temp: 98.6 F (37 C) 98.7 F (37.1 C)   TempSrc: Oral Oral   SpO2: 100% 94% 98%  Weight: 187 lb (84.8 kg)    Height: 5\' 9"  (1.753 m)     6:30 AM Antibiotics started for acute appendicitis. Discussed with Dr. Johney Maine of [facility]. We will transfer the patient to the [facility] ED where surgery will evaluate him. Dr. Randal Buba has accepted for transfer.  PROCEDURES    ED DIAGNOSES     ICD-9-CM ICD-10-CM   1. Acute appendicitis, uncomplicated 290.2 X11.55        Seymour Pavlak, Jenny Reichmann, MD 07/20/16 [phone]

## 2016-07-20 NOTE — Anesthesia Postprocedure Evaluation (Addendum)
Anesthesia Post Note  Patient: Dwayne Acosta  Procedure(s) Performed: Procedure(s) (LRB): APPENDECTOMY LAPAROSCOPIC (N/A)  Patient location during evaluation: PACU Anesthesia Type: General Level of consciousness: awake and alert Pain management: pain level controlled Vital Signs Assessment: post-procedure vital signs reviewed and stable Respiratory status: spontaneous breathing, nonlabored ventilation, respiratory function stable and patient connected to nasal cannula oxygen Cardiovascular status: blood pressure returned to baseline and stable Postop Assessment: no signs of nausea or vomiting Anesthetic complications: no       Last Vitals:  Vitals:   07/20/16 1315 07/20/16 1330  BP: 126/88 129/81  Pulse: 75 75  Resp: 13 16  Temp:  37.2 C    Last Pain:  Vitals:   07/20/16 1330  TempSrc:   PainSc: 2                  Dennis Hegeman,JAMES TERRILL

## 2016-07-20 NOTE — Transfer of Care (Signed)
Immediate Anesthesia Transfer of Care Note  Patient: Dwayne Acosta  Procedure(s) Performed: Procedure(s): APPENDECTOMY LAPAROSCOPIC (N/A)  Patient Location: PACU  Anesthesia Type:General  Level of Consciousness: awake  Airway & Oxygen Therapy: Patient Spontanous Breathing and Patient connected to face mask oxygen  Post-op Assessment: Report given to RN and Post -op Vital signs reviewed and stable  Post vital signs: Reviewed and stable  Last Vitals:  Vitals:   07/20/16 0408 07/20/16 0627  BP: 126/75 131/86  Pulse: 63 63  Resp: 18 20  Temp: 37.1 C     Last Pain:  Vitals:   07/20/16 0722  TempSrc:   PainSc: 1          Complications: No apparent anesthesia complications

## 2016-07-20 NOTE — ED Triage Notes (Addendum)
RUQ Abd pain, vomiting and multiple formed bowel movements - no diarrhea -  since eating at a buffet restaurant at 4pm.

## 2016-07-20 NOTE — Interval H&P Note (Signed)
History and Physical Interval Note:  07/20/2016 11:12 AM  Dwayne Acosta  has presented today for surgery, with the diagnosis of appendicitis.  The various methods of treatment have been discussed with the patient and family. After consideration of risks, benefits and other options for treatment, the patient has consented to    Procedure(s): APPENDECTOMY LAPAROSCOPIC (N/A) as a surgical intervention .    The patient's history has been reviewed, patient examined, no change in status, stable for surgery.  I have reviewed the patient's chart and labs.  Questions were answered to the patient's satisfaction.    Earnstine Regal, MD, [facility] Office: [place]

## 2016-07-20 NOTE — H&P (Signed)
Dwayne Acosta is an 61 y.o. male.   Chief Complaint:  Abdominal pain, nausea and vomiting HPI: 61 year old male presented to admit her [place] with abdominal pain nausea and vomiting. Patient relates pain started yesterday around 3:30 PM. He then developed nausea and vomiting. Had to go to the bathroom several times for bowel movements. Pain became progressively worse throughout the evening, nothing made it better and around 11 PM last night he had his wife take him to the emergency department.  Workup in the ED shows he is afebrile vital signs are stable.labs show an elevated glucose 149 remainder of the CMP and CBC are normal.CT scan shows acute appendicitis with no perforation or abscess some cecal wall thickening was also noted. There is a liver cyst also noted. He is being transferred to [facility] for further evaluation and treatment. Currently in the ED at [facility] he has low-grade fever 99.2 oral. He is tender in the right lower quadrant. Currently no nausea or vomiting.  Past Medical History:  Diagnosis Date  . Overactive bladder     Past Surgical History:  Procedure Laterality Date  . BACK SURGERY    . FOOT SURGERY    . ROTATOR CUFF REPAIR Bilateral     Family History  Problem Relation Age of Onset  . High blood pressure Mother   . Heart attack Father   . Parkinson's disease Father   . Stroke Paternal Grandmother   . Bone cancer Maternal Grandfather    Social History:  reports that he has never smoked. He has never used smokeless tobacco. He reports that he drinks alcohol. He reports that he does not use drugs.  Allergies: No Known Allergies   Prior to Admission medications   Medication Sig Start Date End Date Taking? Authorizing Provider  solifenacin (VESICARE) 10 MG tablet Take 10 mg by mouth daily.   Yes [provider]  traMADol (ULTRAM) 50 MG tablet Take 1 tablet (50 mg total) by mouth every 6 (six) hours as needed. 02/23/15    Hedges, Dellis Filbert, PA-C  traMADol (ULTRAM) 50 MG tablet Take 1 tablet (50 mg total) by mouth every 6 (six) hours as needed for moderate pain. 02/16/16   Jessy Oto, MD     Results for orders placed or performed during the hospital encounter of 07/20/16 (from the past 48 hour(s))  CBC with Differential     Status: Abnormal   Collection Time: 07/20/16  1:56 AM  Result Value Ref Range   WBC 6.3 4.0 - 10.5 K/uL   RBC 4.61 4.22 - 5.81 MIL/uL   Hemoglobin 13.4 13.0 - 17.0 g/dL   HCT 38.1 (L) 39.0 - 52.0 %   MCV 82.6 78.0 - 100.0 fL   MCH 29.1 26.0 - 34.0 pg   MCHC 35.2 30.0 - 36.0 g/dL   RDW 12.9 11.5 - 15.5 %   Platelets 235 150 - 400 K/uL   Neutrophils Relative % 78 %   Neutro Abs 5.0 1.7 - 7.7 K/uL   Lymphocytes Relative 11 %   Lymphs Abs 0.7 0.7 - 4.0 K/uL   Monocytes Relative 10 %   Monocytes Absolute 0.6 0.1 - 1.0 K/uL   Eosinophils Relative 1 %   Eosinophils Absolute 0.0 0.0 - 0.7 K/uL   Basophils Relative 0 %   Basophils Absolute 0.0 0.0 - 0.1 K/uL  Comprehensive metabolic panel     Status: Abnormal   Collection Time: 07/20/16  1:56 AM  Result Value Ref  Range   Sodium 137 135 - 145 mmol/L   Potassium 3.5 3.5 - 5.1 mmol/L   Chloride 103 101 - 111 mmol/L   CO2 26 22 - 32 mmol/L   Glucose, Bld 149 (H) 65 - 99 mg/dL   BUN 18 6 - 20 mg/dL   Creatinine, Ser 0.90 0.61 - 1.24 mg/dL   Calcium 8.8 (L) 8.9 - 10.3 mg/dL   Total Protein 6.5 6.5 - 8.1 g/dL   Albumin 4.0 3.5 - 5.0 g/dL   AST 29 15 - 41 U/L   ALT 23 17 - 63 U/L   Alkaline Phosphatase 56 38 - 126 U/L   Total Bilirubin 0.5 0.3 - 1.2 mg/dL   GFR calc non Af Amer >60 >60 mL/min   GFR calc Af Amer >60 >60 mL/min    Comment: (NOTE) The eGFR has been calculated using the CKD EPI equation. This calculation has not been validated in all clinical situations. eGFR's persistently <60 mL/min signify possible Chronic Kidney Disease.    Anion gap 8 5 - 15  Lipase, blood     Status: None   Collection Time: 07/20/16  1:56  AM  Result Value Ref Range   Lipase 21 11 - 51 U/L   Ct Abdomen Pelvis W Contrast  Result Date: 07/20/2016 CLINICAL DATA:  Epigastric and right upper quadrant pain. Nausea and vomiting. EXAM: CT ABDOMEN AND PELVIS WITH CONTRAST TECHNIQUE: Multidetector CT imaging of the abdomen and pelvis was performed using the standard protocol following bolus administration of intravenous contrast. CONTRAST:  115m ISOVUE-300 IOPAMIDOL (ISOVUE-300) INJECTION 61% COMPARISON:  CT 10/13/2011 FINDINGS: Lower chest: Mild dependent atelectasis at the lung bases. No consolidation. No pleural fluid. The heart appears normal in size. Hepatobiliary: Simple cyst in the right lobe of the liver. Hypodense lesion in the left lobe 2.9 x 2.8 cm, unchanged in size from prior exam. No new hepatic lesion. Gallbladder physiologically distended, no calcified stone. No biliary dilatation. Pancreas: No ductal dilatation or inflammation. Spleen: Normal in size without focal abnormality. Adrenals/Urinary Tract: Subcentimeter nodularity of the right adrenal gland. Left adrenal gland is normal. No hydronephrosis or perinephric edema. Homogeneous enhancement and symmetric excretion on delayed phase imaging. Tiny subcentimeter cortical hypodensity in the left kidney is too small to accurately characterize. Urinary bladder is physiologically distended, no bladder wall thickening. Stomach/Bowel: The appendix is dilated measuring 12 mm, fluid-filled with faint periappendiceal soft tissue stranding. No perforation or abscess. High-density enteric contrast mixing with liquid stool in the cecum and ascending colon. Question of dependent liquid stool with adjacent contrast versus cecal wall thickening. Moderate stool in the transverse colon, the distal colon is decompressed. Enteric contrast in the distal esophagus. Stomach is mildly distended with contrast. No small bowel wall thickening, inflammation or obstruction, enteric contrast reaches the cecum.  Vascular/Lymphatic: No significant vascular findings are present. No enlarged abdominal or pelvic lymph nodes. Reproductive: Prostate is unremarkable. Other: No free air or free fluid.  No intra-abdominal abscess. Musculoskeletal: Posterior L5-S1 fusion with interbody spacer. Adjacent level degenerative change at L4-L5. There are no acute or suspicious osseous abnormalities. IMPRESSION: 1. Findings consistent with early acute appendicitis. No perforation or abscess. 2. Enteric contrast mixes with stool in the cecum and ascending colon, difficult to exclude cecal wall thickening. Recommend up-to-date colonoscopy to exclude colonic malignancy. 3. Simple cyst in the liver. An additional 2.9 cm hypodense lesion is incompletely characterized, however unchanged in size since 2013 and likely benign hemangioma. 4. Enteric contrast in the distal esophagus  Ob seen with gastroesophageal reflux or delayed transit. Electronically Signed   By: Jeb Levering M.D.   On: 07/20/2016 06:12    Review of Systems  Constitutional: Positive for fever (low grade fever in ED here). Negative for chills, diaphoresis, malaise/fatigue and weight loss.  HENT: Negative.   Eyes: Negative.   Respiratory: Negative.   Cardiovascular: Negative.   Gastrointestinal: Positive for abdominal pain (RLQ), diarrhea, nausea and vomiting. Negative for blood in stool, constipation and melena.  Genitourinary:       Ha s issues with overactive bladder , better now with Rx  Musculoskeletal: Positive for joint pain. Back pain: some arthritis.  Skin: Negative.   Neurological: Negative.  Negative for weakness.  Endo/Heme/Allergies: Negative.   Psychiatric/Behavioral: Negative.     Blood pressure 131/86, pulse 63, temperature 98.7 F (37.1 C), temperature source Oral, resp. rate 20, height _0  (1.753 m), weight 84.8 kg (187 lb), SpO2 98 %. Physical Exam  Constitutional: He is oriented to person, place, and time. He appears well-developed and  well-nourished. No distress.  HENT:  Head: Normocephalic and atraumatic.  Mouth/Throat: No oropharyngeal exudate.  Eyes: Right eye exhibits no discharge. Left eye exhibits no discharge. No scleral icterus.  Pupils are equal  Neck: Normal range of motion. No JVD present. No tracheal deviation present. No thyromegaly present.  Cardiovascular: Normal rate, regular rhythm, normal heart sounds and intact distal pulses.   No murmur heard. Respiratory: Effort normal and breath sounds normal. No respiratory distress. He has no wheezes. He has no rales. He exhibits no tenderness.  GI: Soft. He exhibits no distension (right lower quadrant) and no mass. There is tenderness. There is guarding. There is no rebound.  Musculoskeletal: He exhibits no edema or tenderness.  Lymphadenopathy:    He has no cervical adenopathy.  Neurological: He is alert and oriented to person, place, and time. No cranial nerve deficit. Coordination normal.  Skin: Skin is warm and dry. No rash noted. He is not diaphoretic. No erythema. No pallor.  Psychiatric: He has a normal mood and affect. His behavior is normal. Judgment and thought content normal.     Assessment/Plan Acute appendicitis Overactive bladder  Plan: Admit, IV hydration, IV antibiotics, surgery later this a.m.  Indyah Saulnier, PA-C 07/20/2016, 7:06 AM

## 2016-07-20 NOTE — ED Notes (Signed)
Patient transported to OR.

## 2016-07-20 NOTE — Anesthesia Procedure Notes (Signed)
Procedure Name: Intubation Date/Time: 07/20/2016 11:27 AM Performed by: Danley Danker L Patient Re-evaluated:Patient Re-evaluated prior to inductionOxygen Delivery Method: Circle system utilized Preoxygenation: Pre-oxygenation with 100% oxygen Intubation Type: IV induction and Rapid sequence Laryngoscope Size: Miller and 2 Grade View: Grade I Tube type: Oral Tube size: 7.5 mm Number of attempts: 1 Airway Equipment and Method: Stylet Placement Confirmation: ETT inserted through vocal cords under direct vision,  positive ETCO2 and breath sounds checked- equal and bilateral Secured at: 23 cm Tube secured with: Tape Dental Injury: Teeth and Oropharynx as per pre-operative assessment

## 2016-07-20 NOTE — ED Notes (Signed)
Consent signed.

## 2016-07-20 NOTE — ED Notes (Signed)
ED Provider at bedside. 

## 2016-07-20 NOTE — Anesthesia Preprocedure Evaluation (Addendum)
Anesthesia Evaluation  Patient identified by MRN, date of birth, ID band Patient awake    Reviewed: Allergy & Precautions, NPO status , Patient's Chart, lab work & pertinent test results  Airway Mallampati: I       Dental no notable dental hx.    Pulmonary neg pulmonary ROS,    breath sounds clear to auscultation       Cardiovascular negative cardio ROS   Rhythm:Regular Rate:Normal     Neuro/Psych negative neurological ROS  negative psych ROS   GI/Hepatic Neg liver ROS, appendicitis   Endo/Other  negative endocrine ROS  Renal/GU negative Renal ROS  negative genitourinary   Musculoskeletal negative musculoskeletal ROS (+)   Abdominal   Peds negative pediatric ROS (+)  Hematology negative hematology ROS (+)   Anesthesia Other Findings   Reproductive/Obstetrics negative OB ROS                            Anesthesia Physical Anesthesia Plan  ASA: II  Anesthesia Plan: General   Post-op Pain Management:    Induction: Intravenous  Airway Management Planned: Oral ETT  Additional Equipment:   Intra-op Plan:   Post-operative Plan:   Informed Consent: I have reviewed the patients History and Physical, chart, labs and discussed the procedure including the risks, benefits and alternatives for the proposed anesthesia with the patient or authorized representative who has indicated his/her understanding and acceptance.   Dental advisory given  Plan Discussed with:   Anesthesia Plan Comments:        Anesthesia Quick Evaluation

## 2016-07-20 NOTE — Op Note (Signed)
OPERATIVE REPORT - LAPAROSCOPIC APPENDECTOMY  Preop diagnosis: Acute appendicitis  Postop diagnosis: Same  Procedure: Laparoscopic appendectomy  Surgeon:  Earnstine Regal, MD, FACS  Anesthesia: General endotracheal  Estimated blood loss: Minimal  Preparation: Chlora-prep  Complications: None  Indications:  Patient is a 61 yo BM with abd pain localized to RLQ.  CT positive for acute appendicitis. Now for appendectomy.  Procedure:  Patient is brought to the operating room and placed in a supine position on the operating room table. Following administration of general anesthesia, a time out was held and the patient's name and procedure is confirmed. Patient is then prepped and draped in the usual strict aseptic fashion.  After ascertaining that an adequate level of anesthesia has been achieved, a peri-umbilical incision is made with a #15 blade. Dissection is carried down to the fascia. Fascia is incised in the midline and the peritoneal cavity is entered cautiously. A #0-vicryl pursestring suture is placed in the fascia. An Hassan cannula is introduced under direct vision and secured with the pursestring suture. The abdomen is insufflated with carbon dioxide. The laparoscope is introduced and the abdomen is explored. Operative ports are placed in the right upper quadrant and left lower quadrant. The appendix is identified. The mesoappendix is divided with the harmonic scalpel. Dissection is carried down to the base of the appendix. The base of the appendix is dissected out clearing the junction with the cecal wall. Using an Endo-GIA stapler, the base of the appendix is transected at the junction with the cecal wall. There is good approximation of tissue along the staple line. There is good hemostasis along the staple line. The appendix is placed into an endo-catch bag and withdrawn through the umbilical port. The #0-vicryl pursestring suture is tied securely.  Right lower quadrant is irrigated  with warm saline which is evacuated. Good hemostasis is noted. Ports are removed under direct vision. Good hemostasis is noted at the port sites. Pneumoperitoneum is released.  Skin incisions are anesthetized with local anesthetic. Wounds are closed with interrupted 4-0 Monocryl subcuticular sutures. Wounds are washed and dried and benzoin and Steri-Strips are applied. Dressings are applied. The patient is awakened from anesthesia and brought to the recovery room. The patient tolerated the procedure well.  Earnstine Regal, MD, [facility] Office: [phone]

## 2016-07-20 NOTE — ED Provider Notes (Signed)
Blood pressure 131/86, pulse 63, temperature 98.7 F (37.1 C), temperature source Oral, resp. rate 20, height 5\' 9"  (1.753 m), weight 187 lb (84.8 kg), SpO2 98 %.  Assuming care from Dr. Florina Ou.  In short, Dwayne Acosta is a 61 y.o. male with a chief complaint of Abdominal Pain .  Refer to the original H&P for additional details.  The current plan of care is to facilitate surgery evaluation. Patient accepted by Dr. Randal Buba for evaluation of early acute appendicitis.   08:25 AM Patient appears comfortable. Labs and repeat exam performed. Will add Morphine for returning pain. Paged general surgery to make them aware that the patient has arrived. Patient received abx prior to leaving [facility]. Last PO was 3 PM yesterday.   Dwayne Quinton, MD   Dwayne Fast, MD 07/20/16 [phone]

## 2016-07-21 ENCOUNTER — Encounter (HOSPITAL_COMMUNITY): Payer: Self-pay | Admitting: Surgery

## 2016-07-21 MED ORDER — TRAMADOL HCL 50 MG PO TABS: 50.0000 mg | ORAL_TABLET | Freq: Three times a day (TID) | ORAL | Status: DC | PRN

## 2016-07-21 MED ORDER — ACETAMINOPHEN 325 MG PO TABS: ORAL_TABLET | ORAL | Status: DC

## 2016-07-21 MED ORDER — TRAMADOL HCL 50 MG PO TABS: 50.0000 mg | ORAL_TABLET | Freq: Three times a day (TID) | ORAL | 0 refills | Status: DC | PRN

## 2016-07-21 MED ORDER — IBUPROFEN 200 MG PO TABS: 600.0000 mg | ORAL_TABLET | Freq: Four times a day (QID) | ORAL | Status: DC | PRN

## 2016-07-21 NOTE — Discharge Instructions (Signed)
Laparoscopic Appendectomy, Adult, Care After  These instructions give you information about caring for yourself after your procedure. Your doctor may also give you more specific instructions. Call your doctor if you have any problems or questions after your procedure.  Follow these instructions at home:  Medicines   · Take over-the-counter and prescription medicines only as told by your doctor.  · Do not drive for 24 hours if you received a sedative.  · Do not drive or use heavy machinery while taking prescription pain medicine.  · If you were prescribed an antibiotic medicine, take it as told by your doctor. Do not stop taking it even if you start to feel better.  Activity   · Do not lift anything that is heavier than 10 pounds (4.5 kg) for 3 weeks or as told by your doctor.  · Do not play contact sports for 3 weeks or as told by your doctor.  · Slowly return to your normal activities.  Bathing   · Keep your cuts from surgery (incisions) clean and dry.  ? Gently wash the cuts with soap and water.  ? Rinse the cuts with water until the soap is gone.  ? Pat the cuts dry with a clean towel. Do not rub the cuts.  · You may take showers after 48 hours.  · Do not take baths, swim, or use a hot tub for 2 weeks or as told by your doctor.  Cut Care   · Follow instructions from your doctor about how to take care of your cuts. Make sure you:  ? Wash your hands with soap and water before you change your bandage (dressing). If you do not have soap and water, use hand sanitizer.  ? Change your bandage as told by your doctor.  ? Leave stitches (sutures), skin glue, or skin tape (adhesive) strips in place. They may need to stay in place for 2 weeks or longer. If tape strips get loose and curl up, you may trim the loose edges. Do not remove tape strips completely unless your doctor says it is okay.  · Check your cuts every day for signs of infection. Check for:  ? More redness, swelling, or pain.  ? More fluid or  blood.  ? Warmth.  ? Pus or a bad smell.  Other Instructions   · If you were sent home with a drain, follow instructions from your doctor about how to use it and care for it.  · Take deep breaths. This helps to keep your lungs from getting swollen (inflamed).  · To help with constipation:  ? Drink plenty of fluids.  ? Eat plenty of fruits and vegetables.  · Keep all follow-up visits as told by your doctor. This is important.  Contact a doctor if:  · You have more redness, swelling, or pain around a cut from surgery.  · You have more fluid or blood coming from a cut.  · Your cut feels warm to the touch.  · You have pus or a bad smell coming from a cut or a bandage.  · The edges of a cut break open after the stitches have been taken out.  · You have pain in your shoulders that gets worse.  · You feel dizzy or you pass out (faint).  · You have shortness of breath.  · You keep feeling sick to your stomach (nauseous).  · You keep throwing up (vomiting).  · You get diarrhea or you cannot control your   intended to replace advice given to you by your health care provider. Make sure you discuss any questions you have with your health care provider. Document Released: 12/17/2008 Document Revised: 07/29/2015 Document Reviewed: 08/10/2014 Elsevier Interactive Patient Education  2017 [place] ______[employer] LAPAROSCOPIC SURGERY: POST OP INSTRUCTIONS Always review your discharge instruction sheet given to you by the facility where your surgery was performed. IF YOU HAVE DISABILITY OR FAMILY LEAVE FORMS, YOU MUST BRING THEM TO THE OFFICE FOR PROCESSING.   DO NOT GIVE THEM TO YOUR DOCTOR.  1. A prescription for pain medication may be given to you upon  discharge.  Take your pain medication as prescribed, if needed.  If narcotic pain medicine is not needed, then you may take acetaminophen (Tylenol) or ibuprofen (Advil) as needed. 2. Take your usually prescribed medications unless otherwise directed. 3. If you need a refill on your pain medication, please contact your pharmacy.  They will contact our office to request authorization. Prescriptions will not be filled after 5pm or on week-ends. 4. You should follow a light diet the first few days after arrival home, such as soup and crackers, etc.  Be sure to include lots of fluids daily. 5. Most patients will experience some swelling and bruising in the area of the incisions.  Ice packs will help.  Swelling and bruising can take several days to resolve.  6. It is common to experience some constipation if taking pain medication after surgery.  Increasing fluid intake and taking a stool softener (such as Colace) will usually help or prevent this problem from occurring.  A mild laxative (Milk of Magnesia or Miralax) should be taken according to package instructions if there are no bowel movements after 48 hours. 7. Unless discharge instructions indicate otherwise, you may remove your bandages 24-48 hours after surgery, and you may shower at that time.  You may have steri-strips (small skin tapes) in place directly over the incision.  These strips should be left on the skin for 7-10 days.  If your surgeon used skin glue on the incision, you may shower in 24 hours.  The glue will flake off over the next 2-3 weeks.  Any sutures or staples will be removed at the office during your follow-up visit. 8. ACTIVITIES:  You may resume regular (light) daily activities beginning the next day--such as daily self-care, walking, climbing stairs--gradually increasing activities as tolerated.  You may have sexual intercourse when it is comfortable.  Refrain from any heavy lifting or straining until approved by your doctor. a. You  may drive when you are no longer taking prescription pain medication, you can comfortably wear a seatbelt, and you can safely maneuver your car and apply brakes. b. RETURN TO WORK:  __________________________________________________________ 9. You should see your doctor in the office for a follow-up appointment approximately 2-3 weeks after your surgery.  Make sure that you call for this appointment within a day or two after you arrive home to insure a convenient appointment time. 10. OTHER INSTRUCTIONS: __________________________________________________________________________________________________________________________ __________________________________________________________________________________________________________________________ WHEN TO CALL YOUR DOCTOR: 1. Fever over 101.0 2. Inability to urinate 3. Continued bleeding from incision. 4. Increased pain, redness, or drainage from the incision. 5. Increasing abdominal pain  The clinic staff is available to answer your questions during regular business hours.  Please dont hesitate to call and ask to speak to one of the nurses for clinical concerns.  If you have a medical emergency, go to the nearest emergency room or call  911.  A surgeon from [facility] is always on call at the hospital. [address] [place], [place], [place]  [address] ? [address] [place], [place], [place]   [address] [phone] ? [phone] ? FAX [phone] Web site: [email or web address]

## 2016-07-21 NOTE — Progress Notes (Signed)
Discharge instructions discussed with patient and family, verbalized agreement and understanding. Prescription given to patient 

## 2016-07-21 NOTE — Progress Notes (Signed)
1 Day Post-Op    CC: Abdominal pain  Subjective: Doing well this a.m., tolerating by mouth's well, voiding without difficulty, port sites all look good.  Objective: Vital signs in last 24 hours: Temp:  [98.4 F (36.9 C)-100 F (37.8 C)] 98.8 F (37.1 C) (05/18 0510) Pulse Rate:  [51-83] 51 (05/18 0510) Resp:  [13-18] 18 (05/18 0510) BP: (106-144)/(71-98) 113/71 (05/18 0510) SpO2:  [96 %-100 %] 99 % (05/18 0510) Last BM Date: 07/20/16 By mouth 1440 IV 2500 Urine 3900 Afebrile vital signs are stable No labs Intake/Output from previous day: 05/17 0701 - 05/18 0700 In: 3925.1 [P.O.:1440; I.V.:2185.1; IV Piggyback:300] Out: 3950 [Urine:3900; Blood:50] Intake/Output this shift: Total I/O In: 400 [P.O.:400] Out: 0   General appearance: alert, cooperative and no distress GI: Soft positive bowel sounds, port sites look good. Sore and tender from port sites.  Lab Results:   Recent Labs  07/20/16 0156  WBC 6.3  HGB 13.4  HCT 38.1*  PLT 235    BMET  Recent Labs  07/20/16 0156  NA 137  K 3.5  CL 103  CO2 26  GLUCOSE 149*  BUN 18  CREATININE 0.90  CALCIUM 8.8*   PT/INR No results for input(s): LABPROT, INR in the last 72 hours.   Recent Labs Lab 07/20/16 0156  AST 29  ALT 23  ALKPHOS 56  BILITOT 0.5  PROT 6.5  ALBUMIN 4.0     Lipase     Component Value Date/Time   LIPASE 21 07/20/2016 0156     Medications:   Assessment/Plan Acute appendicitis Status post laparoscopic appendectomy 07/20/16 Dr. Armandina Gemma Overactive bladder FEN: Regular diet DVT: SCDs ID: Preop Rocephin and Flagyl    Plan discharge home follow-up in the office.   LOS: 0 days    Dwayne Acosta 07/21/2016 [phone]

## 2016-08-04 NOTE — Addendum Note (Signed)
Addendum  created 08/04/16 1501 by Rica Koyanagi, MD   Sign clinical note

## 2016-08-07 NOTE — Discharge Summary (Signed)
Physician Discharge Summary  Patient ID: Dwayne Acosta MRN: [id number] DOB/AGE: 62-Nov-1957 62 y.o.  Admit date: 07/20/2016 Discharge date: 07/21/2016  Admission Diagnoses:  Acute appendicitis Overactive bladder  Discharge Diagnoses:  Same   Active Problems:   Acute appendicitis   PROCEDURES: Laparoscopic appendectomy 07/20/16 Dr. Hyman Bible Course: 61 year old male presented to admit her [place] with abdominal pain nausea and vomiting. Patient relates pain started yesterday around 3:30 PM. He then developed nausea and vomiting. Had to go to the bathroom several times for bowel movements. Pain became progressively worse throughout the evening, nothing made it better and around 11 PM last night he had his wife take him to the emergency department.  Workup in the ED shows he is afebrile vital signs are stable.labs show an elevated glucose 149 remainder of the CMP and CBC are normal.CT scan shows acute appendicitis with no perforation or abscess some cecal wall thickening was also noted. There is a liver cyst also noted. He is being transferred to [facility] for further evaluation and treatment. Currently in the ED at [facility] he has low-grade fever 99.2 oral. He is tender in the right lower quadrant. Currently no nausea or vomiting.  Patient was seen in emergency department by Dr. Harlow Asa and taken to the operating room later that day. He underwent laparoscopic appendectomy and did well. He was tolerating a full diet well and was discharged home the following a.m.  Condition ON discharge: Improved.  CBC Latest Ref Rng & Units 07/20/2016 08/09/2009  WBC 4.0 - 10.5 K/uL 6.3 -  Hemoglobin 13.0 - 17.0 g/dL 13.4 14.5  Hematocrit 39.0 - 52.0 % 38.1(L) -  Platelets 150 - 400 K/uL 235 -   CMP Latest Ref Rng & Units 07/20/2016  Glucose 65 - 99 mg/dL 149(H)  BUN 6 - 20 mg/dL 18  Creatinine 0.61 - 1.24 mg/dL 0.90  Sodium 135 - 145 mmol/L 137  Potassium 3.5 -  5.1 mmol/L 3.5  Chloride 101 - 111 mmol/L 103  CO2 22 - 32 mmol/L 26  Calcium 8.9 - 10.3 mg/dL 8.8(L)  Total Protein 6.5 - 8.1 g/dL 6.5  Total Bilirubin 0.3 - 1.2 mg/dL 0.5  Alkaline Phos 38 - 126 U/L 56  AST 15 - 41 U/L 29  ALT 17 - 63 U/L 23    Disposition: 01-Home or Self Care   Allergies as of 07/21/2016   No Known Allergies     Medication List    STOP taking these medications   AXIRON 30 MG/ACT Soln Generic drug:  Testosterone   BACTRIM DS PO   predniSONE 20 MG tablet Commonly known as:  DELTASONE     TAKE these medications   acetaminophen 325 MG tablet Commonly known as:  TYLENOL You take 2 every 4 hours as needed for pain. Do not exceed 4000 mg per day of Tylenol.   naproxen sodium 220 MG tablet Commonly known as:  ANAPROX Take 440 mg by mouth 2 (two) times daily with a meal.   traMADol 50 MG tablet Commonly known as:  ULTRAM Take 1 tablet (50 mg total) by mouth every 6 (six) hours as needed. What changed:  Another medication with the same name was added. Make sure you understand how and when to take each.   traMADol 50 MG tablet Commonly known as:  ULTRAM Take 1 tablet (50 mg total) by mouth every 6 (six) hours as needed for moderate pain. What changed:  Another medication with the same  name was added. Make sure you understand how and when to take each.   traMADol 50 MG tablet Commonly known as:  ULTRAM Take 1-2 tablets (50-100 mg total) by mouth every 8 (eight) hours as needed for moderate pain. What changed:  You were already taking a medication with the same name, and this prescription was added. Make sure you understand how and when to take each.      Follow-up Information    [facility] Follow up.   Specialty:  General Surgery Why:  Our office will call with follow-up appointment. If you do not hear by Tuesday call and ask for a [facility] appointment Contact information: [address] [place] [address] [phone]            Signed: Earnstine Regal 08/07/2016, 1:38 PM

## 2016-09-01 ENCOUNTER — Encounter (HOSPITAL_COMMUNITY): Payer: Self-pay | Admitting: Emergency Medicine

## 2016-09-01 ENCOUNTER — Emergency Department (HOSPITAL_COMMUNITY)
Admission: EM | Admit: 2016-09-01 | Discharge: 2016-09-01 | Disposition: A | Discharge status: Home / Self Care | Payer: 59 | Attending: Emergency Medicine | Admitting: Emergency Medicine

## 2016-09-01 DIAGNOSIS — H109 Unspecified conjunctivitis: Secondary | ICD-10-CM

## 2016-09-01 DIAGNOSIS — H5711 Ocular pain, right eye: Secondary | ICD-10-CM | POA: Diagnosis not present

## 2016-09-01 MED ORDER — KETOROLAC TROMETHAMINE 0.5 % OP SOLN
1.0000 [drp] | Freq: Once | OPHTHALMIC | Status: AC
  Administered 2016-09-01: 1 [drp] via OPHTHALMIC
  Filled 2016-09-01: qty 5

## 2016-09-01 MED ORDER — TOBRAMYCIN 0.3 % OP SOLN
2.0000 [drp] | Freq: Once | OPHTHALMIC | Status: AC
  Administered 2016-09-01: 2 [drp] via OPHTHALMIC
  Filled 2016-09-01: qty 5

## 2016-09-01 NOTE — Discharge Instructions (Signed)
Your examination is consistent with a conjunctivitis. Please apply cool compresses to your eyes 3 or 4 times daily. Please do not use your contact lens over the next 5 days. Please use 2 drops of tobramycin every 4 hours for the next 5 days. Use 1 drop of Acular to your right eye 3 times daily for the next 5-7 days. Wash hands frequently. Please keep your distance from others until this has resolved. Please see your eye specialist if not improving.

## 2016-09-01 NOTE — ED Provider Notes (Signed)
[facility] DEPT Provider Note   CSN: [id number] Arrival date & time: 09/01/16  2132     History   Chief Complaint Chief Complaint  Patient presents with  . Eye Problem    HPI Dwayne Acosta is a 61 y.o. male.  Patient is a 61 year old male who presents to the emergency department with a complaint of pain and swelling of the right eye.  Patient states that over the last 2 days he's been noticing some pain and swelling of the eye. There's been some increased watering of the right eye. It is of note that the patient wears contact lens on. He is now noticing some swelling of the upper and lower lid. He is noticing increasing redness of the eye. The patient states he does not feel that anything has gotten in his eye. 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 He does not recall any insect bites. He's not had any recent operations or procedures involving his eyes. There no vision changes to be reported.   The history is provided by the patient.  Eye Problem   Associated symptoms include photophobia and eye redness. Pertinent negatives include no discharge.    Past Medical History:  Diagnosis Date  . Overactive bladder     Patient Active Problem List   Diagnosis Date Noted  . Acute appendicitis 07/20/2016    Past Surgical History:  Procedure Laterality Date  . BACK SURGERY    . FOOT SURGERY    . LAPAROSCOPIC APPENDECTOMY N/A 07/20/2016   Procedure: APPENDECTOMY LAPAROSCOPIC;  Surgeon: Armandina Gemma, MD;  Location: [facility] ORS;  Service: General;  Laterality: N/A;  . ROTATOR CUFF REPAIR Bilateral        Home Medications    Prior to Admission medications   Medication Sig Start Date End Date Taking? Authorizing Provider  acetaminophen (TYLENOL) 325 MG tablet You take 2 every 4 hours as needed for pain. Do not exceed 4000 mg per day of Tylenol. 07/21/16   Earnstine Regal, PA-C  naproxen sodium (ANAPROX) 220 MG tablet Take 440 mg by mouth 2 (two)  times daily with a meal.    [provider]  traMADol (ULTRAM) 50 MG tablet Take 1 tablet (50 mg total) by mouth every 6 (six) hours as needed. Patient not taking: Reported on 07/20/2016 02/23/15   Okey Regal, PA-C  traMADol (ULTRAM) 50 MG tablet Take 1 tablet (50 mg total) by mouth every 6 (six) hours as needed for moderate pain. 02/16/16   Jessy Oto, MD  traMADol (ULTRAM) 50 MG tablet Take 1-2 tablets (50-100 mg total) by mouth every 8 (eight) hours as needed for moderate pain. 07/21/16   Earnstine Regal, PA-C    Family History Family History  Problem Relation Age of Onset  . High blood pressure Mother   . Heart attack Father   . Parkinson's disease Father   . Stroke Paternal Grandmother   . Bone cancer Maternal Grandfather     Social History Social History  Substance Use Topics  . Smoking status: Never Smoker  . Smokeless tobacco: Never Used  . Alcohol use 0.0 oz/week     Comment: occ     Allergies   Patient has no known allergies.   Review of Systems Review of Systems  Constitutional: Negative for activity change.       All ROS Neg except as noted in HPI  HENT: Negative for nosebleeds.   Eyes: Positive for photophobia, pain and redness. Negative  for discharge.  Respiratory: Negative for cough, shortness of breath and wheezing.   Cardiovascular: Negative for chest pain and palpitations.  Gastrointestinal: Negative for abdominal pain and blood in stool.  Genitourinary: Negative for dysuria, frequency and hematuria.  Musculoskeletal: Negative for arthralgias, back pain and neck pain.  Skin: Negative.   Neurological: Negative for dizziness, seizures and speech difficulty.  Psychiatric/Behavioral: Negative for confusion and hallucinations.     Physical Exam Updated Vital Signs BP 115/79 (BP Location: Left Arm)   Pulse 70   Temp 97.9 F (36.6 C) (Oral)   Resp 18   Ht 5\' 9"  (1.753 m)   Wt 84.4 kg (186 lb)   SpO2 98%   BMI 27.47 kg/m    Physical Exam  Constitutional: He is oriented to person, place, and time. He appears well-developed and well-nourished.  Non-toxic appearance.  HENT:  Head: Normocephalic.  Right Ear: Tympanic membrane and external ear normal.  Left Ear: Tympanic membrane and external ear normal.  The x-ray movements are intact on the right and the left. There is increased redness of the conjunctiva and the bulbar conjunctiva on the right. There is mild tenderness to palpation of the upper and lower lid. There is no periorbital redness nor increase in temperature.  Eyes: EOM and lids are normal. Pupils are equal, round, and reactive to light.  Neck: Normal range of motion. Neck supple. Carotid bruit is not present.  Cardiovascular: Normal rate, regular rhythm, normal heart sounds, intact distal pulses and normal pulses.   Pulmonary/Chest: Breath sounds normal. No respiratory distress.  Abdominal: Soft. Bowel sounds are normal. There is no tenderness. There is no guarding.  Musculoskeletal: Normal range of motion.  Lymphadenopathy:       Head (right side): No submandibular adenopathy present.       Head (left side): No submandibular adenopathy present.    He has no cervical adenopathy.  Neurological: He is alert and oriented to person, place, and time. He has normal strength. No cranial nerve deficit or sensory deficit.  Skin: Skin is warm and dry.  Psychiatric: He has a normal mood and affect. His speech is normal.  Nursing note and vitals reviewed.    ED Treatments / Results  Labs (all labs ordered are listed, but only abnormal results are displayed) Labs Reviewed - No data to display  EKG  EKG Interpretation None       Radiology No results found.  Procedures Procedures (including critical care time)  Medications Ordered in ED Medications - No data to display   Initial Impression / Assessment and Plan / ED Course  I have reviewed the triage vital signs and the nursing  notes.  Pertinent labs & imaging results that were available during my care of the patient were reviewed by me and considered in my medical decision making (see chart for details).      Final Clinical Impressions(s) / ED Diagnoses MDM Vital signs reviewed. The examination is consistent with conjunctivitis involving the right eye. The patient will refrain from using his contacts over the next 5 days. He will use Acular 1 drop 3 times daily to the right eye, he will use tobramycin 2 drops every 4 hours for the next 5 days on. I've advised the patient to use cool compresses to his eyes to a 3 times daily. He's been advised to wash his hands frequently and to keep his distance from others. The patient will follow with his eye specialist if not improving.  Final diagnoses:  Conjunctivitis of right eye, unspecified conjunctivitis type    New Prescriptions New Prescriptions   No medications on file     Lily Kocher, Hershal Coria 09/01/16 2234    Forde Dandy, MD 09/05/16 1359

## 2016-09-01 NOTE — ED Triage Notes (Signed)
Pt states he has been having pain, swelling, and redness in right eye for two days

## 2016-09-27 ENCOUNTER — Ambulatory Visit (INDEPENDENT_AMBULATORY_CARE_PROVIDER_SITE_OTHER): Payer: 59 | Admitting: Specialist

## 2016-09-27 ENCOUNTER — Encounter (INDEPENDENT_AMBULATORY_CARE_PROVIDER_SITE_OTHER): Payer: Self-pay | Admitting: Specialist

## 2016-09-27 ENCOUNTER — Ambulatory Visit (INDEPENDENT_AMBULATORY_CARE_PROVIDER_SITE_OTHER): Payer: 59

## 2016-09-27 VITALS — BP 125/81 | HR 56 | Ht 69.0 in | Wt 187.0 lb

## 2016-09-27 DIAGNOSIS — M4316 Spondylolisthesis, lumbar region: Secondary | ICD-10-CM

## 2016-09-27 DIAGNOSIS — M48062 Spinal stenosis, lumbar region with neurogenic claudication: Secondary | ICD-10-CM

## 2016-09-27 DIAGNOSIS — Z981 Arthrodesis status: Secondary | ICD-10-CM | POA: Diagnosis not present

## 2016-09-27 DIAGNOSIS — M5441 Lumbago with sciatica, right side: Secondary | ICD-10-CM | POA: Diagnosis not present

## 2016-09-27 DIAGNOSIS — G8929 Other chronic pain: Secondary | ICD-10-CM | POA: Diagnosis not present

## 2016-09-27 MED ORDER — TRAMADOL HCL 50 MG PO TABS: 50.0000 mg | ORAL_TABLET | Freq: Four times a day (QID) | ORAL | 0 refills | Status: DC | PRN

## 2016-09-27 MED ORDER — DICLOFENAC SODIUM 75 MG PO TBEC: 75.0000 mg | DELAYED_RELEASE_TABLET | Freq: Two times a day (BID) | ORAL | 2 refills | Status: DC

## 2016-09-27 NOTE — Patient Instructions (Addendum)
Avoid bending, stooping and avoid lifting weights greater than 10 lbs. Avoid prolong standing and walking. Avoid frequent bending and stooping  No lifting greater than 10 lbs. May use ice or moist heat for pain. Weight loss is of benefit.  

## 2016-09-27 NOTE — Progress Notes (Signed)
Office Visit Note   Patient: Dwayne Acosta           Date of Birth: 07/17/1955           MRN: [id number] Visit Date: 09/27/2016              Requested by: [place], [facility] [place], [place] [address] PCP: [facility]   Assessment & Plan: Visit Diagnoses:  1. History of lumbar spinal fusion   2. Spondylolisthesis, lumbar region   3. Spinal stenosis of lumbar region with neurogenic claudication   4. Chronic midline low back pain with right-sided sciatica     Plan: Avoid bending, stooping and avoid lifting weights greater than 10 lbs. Avoid prolong standing and walking. Avoid frequent bending and stooping  No lifting greater than 10 lbs. May use ice or moist heat for pain. Weight loss is of benefit.  Follow-Up Instructions: Return in about 6 months (around 03/30/2017).   Orders:  Orders Placed This Encounter  Procedures  . XR Lumbar Spine 2-3 Views   Meds ordered this encounter  Medications  . diclofenac (VOLTAREN) 75 MG EC tablet    Sig: Take 1 tablet (75 mg total) by mouth 2 (two) times daily.    Dispense:  60 tablet    Refill:  2  . traMADol (ULTRAM) 50 MG tablet    Sig: Take 1 tablet (50 mg total) by mouth every 6 (six) hours as needed for moderate pain.    Dispense:  60 tablet    Refill:  0    Unabe to take generic due to Gu symptoms of urgency and pain.      Procedures: No procedures performed   Clinical Data: No additional findings.   Subjective: Chief Complaint  Patient presents with  . Lower Back - Follow-up  . Right Leg - Pain    62 year old male status post L5-S1 decompression and fusion for spondlylolisthesis. He is walking but unable to jog. [place] the [facility] downtown. He uses a back brace intermittantly especially in the fall. Prolong standing in one place or to bend backwards causes pain. Stooping with leaning he usually will hold on to  something. 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 He takes tramadol intermittently When other meds are not enough. He finds sleeping on the left side with a pillow under the left waist help. His primary care doctor has noted that his right knee reflex is decreased. Scale of one to ten it is about a 7, now. The right leg can get so bad that it feels lke, the right anterior shin and lower but not into the foot. Occsionally under the big toe but not a lot in the foot. In the morning he tends to limp a lot. In the morning he is stiff and after stretching and walking it eases off. The stiffness and pain is     Review of Systems  Constitutional: Negative.   HENT: Negative.   Eyes: Negative.   Respiratory: Negative.   Cardiovascular: Negative.   Gastrointestinal: Negative.   Endocrine: Negative.   Genitourinary: Negative.   Musculoskeletal: Negative.   Skin: Negative.   Allergic/Immunologic: Negative.   Neurological: Negative.   Hematological: Negative.   Psychiatric/Behavioral: Negative.      Objective: Vital Signs: BP 125/81 (BP Location: Left Arm, Patient Position:  Sitting)   Pulse (!) 56   Ht 5\' 9"  (1.753 m)   Wt 187 lb (84.8 kg)   BMI 27.62 kg/m   Physical Exam  Constitutional: He is oriented to person, place, and time. He appears well-developed and well-nourished.  HENT:  Head: Normocephalic and atraumatic.  Eyes: Pupils are equal, round, and reactive to light. EOM are normal.  Neck: Normal range of motion. Neck supple.  Pulmonary/Chest: Effort normal and breath sounds normal.  Abdominal: Soft. Bowel sounds are normal.  Neurological: He is alert and oriented to person, place, and time.  Skin: Skin is warm and dry.  Psychiatric: He has a normal mood and affect. His behavior is normal. Judgment and thought content normal.    Back Exam   Tenderness  The patient is experiencing tenderness in the lumbar.  Range of  Motion  Extension: abnormal  Flexion: normal  Lateral Bend Right: abnormal  Lateral Bend Left: normal  Rotation Right: abnormal  Rotation Left: normal   Muscle Strength  The patient has normal back strength.  Tests  Straight leg raise right: negative Straight leg raise left: negative  Reflexes  Patellar: 1/4 Achilles: 2/4 Babinski's sign: normal   Other  Toe Walk: normal Heel Walk: normal Erythema: no back redness Scars: absent      Specialty Comments:  No specialty comments available.  Imaging: No results found.   PMFS History: Patient Active Problem List   Diagnosis Date Noted  . Acute appendicitis 07/20/2016   Past Medical History:  Diagnosis Date  . Overactive bladder     Family History  Problem Relation Age of Onset  . High blood pressure Mother   . Heart attack Father   . Parkinson's disease Father   . Stroke Paternal Grandmother   . Bone cancer Maternal Grandfather     Past Surgical History:  Procedure Laterality Date  . BACK SURGERY    . FOOT SURGERY    . LAPAROSCOPIC APPENDECTOMY N/A 07/20/2016   Procedure: APPENDECTOMY LAPAROSCOPIC;  Surgeon: Armandina Gemma, MD;  Location: [facility];  Service: General;  Laterality: N/A;  . ROTATOR CUFF REPAIR Bilateral    Social History   Occupational History  .  [employer]   Social History Main Topics  . Smoking status: Never Smoker  . Smokeless tobacco: Never Used  . Alcohol use 0.0 oz/week     Comment: occ  . Drug use: No  . Sexual activity: Not on file

## 2016-10-13 ENCOUNTER — Telehealth (INDEPENDENT_AMBULATORY_CARE_PROVIDER_SITE_OTHER): Payer: Self-pay | Admitting: Radiology

## 2016-10-13 ENCOUNTER — Ambulatory Visit (INDEPENDENT_AMBULATORY_CARE_PROVIDER_SITE_OTHER): Payer: 59 | Admitting: Family

## 2016-10-13 ENCOUNTER — Other Ambulatory Visit (INDEPENDENT_AMBULATORY_CARE_PROVIDER_SITE_OTHER): Payer: Self-pay | Admitting: Specialist

## 2016-10-13 DIAGNOSIS — M4316 Spondylolisthesis, lumbar region: Secondary | ICD-10-CM

## 2016-10-13 NOTE — Progress Notes (Unsigned)
Patient called with persistent back and leg pain and now increasing weakness in his leg. MRI is ordered as he is developing spondylolisthesis above  Previous fusion site. Fusion L5-S1 in 2004 now with  Grade 1 spondylolisthesis at L4-5. Suspect increasing stenosis associated with the adjacent L4-5 spondylolisthesis.

## 2016-10-13 NOTE — Telephone Encounter (Signed)
MRI ordered and needs a follow up visit in 2-3 weeks. jen

## 2016-10-13 NOTE — Telephone Encounter (Signed)
Patient called and LM earlier today that he needs to be seen or get an MRI one.  He is having increased right leg pain/weakness/tingling.  The right leg has given out on him, he cannot walk very far at all without having to stop and rest.  He says the anti inflammatory BID is not helping.  He requests an MRI to be ordered ASAP.  Please advise?  (He actually came into the office and made an appt with Erin this afternoon, I am cancelling that appt in hopes we can order the MRI for him)

## 2016-10-13 NOTE — Telephone Encounter (Signed)
Can you sched patient 2-3 weeks where ever you can get him in.Please?

## 2016-10-13 NOTE — Progress Notes (Signed)
Can you get this scheduled ASAP?  Thanks!!

## 2016-10-13 NOTE — Telephone Encounter (Signed)
Please advise 

## 2016-10-17 ENCOUNTER — Ambulatory Visit
Admission: RE | Admit: 2016-10-17 | Discharge: 2016-10-17 | Disposition: A | Discharge status: Home / Self Care | Payer: 59 | Source: Ambulatory Visit | Attending: Specialist | Admitting: Specialist

## 2016-10-17 ENCOUNTER — Telehealth (INDEPENDENT_AMBULATORY_CARE_PROVIDER_SITE_OTHER): Payer: Self-pay | Admitting: Specialist

## 2016-10-17 DIAGNOSIS — M5136 Other intervertebral disc degeneration, lumbar region: Secondary | ICD-10-CM | POA: Diagnosis not present

## 2016-10-17 DIAGNOSIS — M4316 Spondylolisthesis, lumbar region: Secondary | ICD-10-CM

## 2016-10-17 DIAGNOSIS — M5126 Other intervertebral disc displacement, lumbar region: Secondary | ICD-10-CM | POA: Diagnosis not present

## 2016-10-17 MED ORDER — GADOBENATE DIMEGLUMINE 529 MG/ML IV SOLN
17.0000 mL | Freq: Once | INTRAVENOUS | Status: AC | PRN
  Administered 2016-10-17: 17 mL via INTRAVENOUS

## 2016-10-17 NOTE — Telephone Encounter (Signed)
Called patient left message to return call to schedule appointment [phone]

## 2016-10-17 NOTE — Telephone Encounter (Signed)
Called patient left message to return call to schedule appointment with Dr. Louanne Skye  [phone]

## 2016-10-17 NOTE — Telephone Encounter (Signed)
Patient scheduled with Dr. Louanne Skye 10/26/16 at 10:15am for MRI  review

## 2016-10-17 NOTE — Progress Notes (Signed)
Pt insurance went into medical review will hear in 2 business days of decision.

## 2016-10-18 NOTE — Progress Notes (Signed)
Pt had appt scheduled on 10/17/16, per pt

## 2016-10-26 ENCOUNTER — Encounter (INDEPENDENT_AMBULATORY_CARE_PROVIDER_SITE_OTHER): Payer: Self-pay | Admitting: Specialist

## 2016-10-26 ENCOUNTER — Ambulatory Visit (INDEPENDENT_AMBULATORY_CARE_PROVIDER_SITE_OTHER): Payer: 59 | Admitting: Specialist

## 2016-10-26 VITALS — BP 121/84 | HR 51

## 2016-10-26 DIAGNOSIS — M4316 Spondylolisthesis, lumbar region: Secondary | ICD-10-CM

## 2016-10-26 DIAGNOSIS — M5136 Other intervertebral disc degeneration, lumbar region: Secondary | ICD-10-CM

## 2016-10-26 DIAGNOSIS — M48062 Spinal stenosis, lumbar region with neurogenic claudication: Secondary | ICD-10-CM | POA: Diagnosis not present

## 2016-10-26 NOTE — Patient Instructions (Signed)
Avoid bending, stooping and avoid lifting weights greater than 10 lbs. Avoid prolong standing and walking.  Surgery scheduling secretary Kandice Hams, will call you in the next week to schedule for surgery.  Surgery recommended is a one level lumbar fusion L4-5 this would be done with rods, screws and cage with local bone graft and allograft (donor bone graft). Take hydrocodone for for pain. Risk of surgery includes risk of infection 1 in 200 patients, bleeding 1/2% chance you would need a transfusion.   Risk to the nerves is one in 10,000. You will need to use a brace for 3 months and wean from the brace on the 4th month. Expect improved walking and standing tolerance. Expect relief of leg pain but numbness may persist depending on the length and degree of pressure that has been present.

## 2016-10-26 NOTE — Progress Notes (Signed)
Office Visit Note   Patient: Dwayne Acosta           Date of Birth: 1955/03/21           MRN: [id number] Visit Date: 10/26/2016              Requested by: [place], [facility] [place], [place] [address] PCP: [facility]   Assessment & Plan: Visit Diagnoses:  1. Spinal stenosis of lumbar region with neurogenic claudication   2. Spondylolisthesis, lumbar region   3. Degenerative disc disease, lumbar     Plan: Avoid bending, stooping and avoid lifting weights greater than 10 lbs. Avoid prolong standing and walking.  Surgery scheduling secretary Kandice Hams, will call you in the next week to schedule for surgery.  Surgery recommended is a one level lumbar fusion L4-5 this would be done with rods, screws and cage with local bone graft and allograft (donor bone graft). Take hydrocodone for for pain. Risk of surgery includes risk of infection 1 in 200 patients, bleeding 1/2% chance you would need a transfusion.   Risk to the nerves is one in 10,000. You will need to use a brace for 3 months and wean from the brace on the 4th month. Expect improved walking and standing tolerance. Expect relief of leg pain but numbness may persist depending on the length and degree of pressure that has been present Follow-Up Instructions: Return in about 4 weeks (around 11/23/2016).   Orders:  No orders of the defined types were placed in this encounter.  No orders of the defined types were placed in this encounter.     Procedures: No procedures performed   Clinical Data: No additional findings.   Subjective: Chief Complaint  Patient presents with  . Lower Back - Follow-up    61 year old male 10 years post L5-S1 fusion for spondylolisthesis now with persistent back pain with radiation into the right L4 distribution. MRI done show grade one spondylolisthesis with right foraminal stenosis. No bowel or bladder difficulty. Unable to  walk greater than 150 feet, into and out of the grocery store but no further. 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     Review of Systems   Objective: Vital Signs: BP 121/84   Pulse (!) 51   Physical Exam  Ortho Exam  Specialty Comments:  No specialty comments available.  Imaging: No results found.   PMFS History: Patient Active Problem List   Diagnosis Date Noted  . Acute appendicitis 07/20/2016   Past Medical History:  Diagnosis Date  . Overactive bladder     Family History  Problem Relation Age of Onset  . High blood pressure Mother   . Heart attack Father   . Parkinson's disease Father   . Stroke Paternal Grandmother   . Bone cancer Maternal Grandfather     Past Surgical History:  Procedure Laterality Date  . BACK SURGERY    . FOOT SURGERY    . LAPAROSCOPIC APPENDECTOMY N/A 07/20/2016   Procedure: APPENDECTOMY LAPAROSCOPIC;  Surgeon: Armandina Gemma, MD;  Location: [facility];  Service: General;  Laterality: N/A;  . ROTATOR CUFF REPAIR Bilateral    Social History   Occupational History  .  [employer]   Social History Main Topics  . Smoking status: Never Smoker  . Smokeless tobacco: Never Used  . Alcohol use 0.0 oz/week  Comment: occ  . Drug use: No  . Sexual activity: Not on file

## 2016-10-31 ENCOUNTER — Telehealth (INDEPENDENT_AMBULATORY_CARE_PROVIDER_SITE_OTHER): Payer: Self-pay | Admitting: Radiology

## 2016-10-31 NOTE — Telephone Encounter (Signed)
Dwayne Acosta is calling, he is wanting to know 1) will the "rods" that are being out in his back going to bend or move, 2) will this effect his bending, and 3) how will this effect the L3 level? Patients call back number is [phone]

## 2016-11-14 NOTE — Pre-Procedure Instructions (Signed)
Dwayne Acosta  11/14/2016      [id number] [place] [place] [address] [place] [address] Phone: [phone] Fax: [phone]    Your procedure is scheduled on September 18  Report to [facility] at [place].M.  Call this number if you have problems the morning of surgery:  [phone]   Remember:  Do not eat food or drink liquids after midnight.  Continue all other medications as directed by your physician except follow these medication instructions before surgery   Take these medicines the morning of surgery with A SIP OF WATER  fluticasone (FLONASE) Eye drops traMADol (ULTRAM  7 days prior to surgery STOP taking any Aspirin, Aleve, Naproxen, Ibuprofen, Motrin, Advil, Goody's, BC's, all herbal medications, fish oil, and all vitamins    Do not wear jewelry  Do not wear lotions, powders, or cologne, or deoderant.  Men may shave face and neck.  Do not bring valuables to the hospital.  [facility] is not responsible for any belongings or valuables.  Contacts, dentures or bridgework may not be worn into surgery.  Leave your suitcase in the car.  After surgery it may be brought to your room.  For patients admitted to the hospital, discharge time will be determined by your treatment team.  Patients discharged the day of surgery will not be allowed to drive home.    Special instructions:   [facility]- Preparing For Surgery  Before surgery, you can play an important role. Because skin is not sterile, your skin needs to be as free of germs as possible. You can reduce the number of germs on your skin by washing with CHG (chlorahexidine gluconate) Soap before surgery.  CHG is an antiseptic cleaner which kills germs and bonds with the skin to continue killing germs even after washing.  Please do not use if you have an allergy to CHG or antibacterial soaps. If your skin  becomes reddened/irritated stop using the CHG.  Do not shave (including legs and underarms) for at least 48 hours prior to first CHG shower. It is OK to shave your face.  Please follow these instructions carefully.   1. Shower the NIGHT BEFORE SURGERY and the MORNING OF SURGERY with CHG.   2. If you chose to wash your hair, wash your hair first as usual with your normal shampoo.  3. After you shampoo, rinse your hair and body thoroughly to remove the shampoo.  4. Use CHG as you would any other liquid soap. You can apply CHG directly to the skin and wash gently with a scrungie or a clean washcloth.   5. Apply the CHG Soap to your body ONLY FROM THE NECK DOWN.  Do not use on open wounds or open sores. Avoid contact with your eyes, ears, mouth and genitals (private parts). Wash genitals (private parts) with your normal soap.  6. Wash thoroughly, paying special attention to the area where your surgery will be performed.  7. Thoroughly rinse your body with warm water from the neck down.  8. DO NOT shower/wash with your normal soap after using and rinsing off the CHG Soap.  9. Pat yourself dry with a CLEAN TOWEL.   10. Wear CLEAN PAJAMAS   11. Place CLEAN SHEETS on your bed the night of your first shower and DO NOT SLEEP WITH PETS.    Day of Surgery: Do not apply any deodorants/lotions. Please  wear clean clothes to the hospital/surgery center.    r  Please read over the following fact sheets that you were given.

## 2016-11-15 ENCOUNTER — Encounter (HOSPITAL_COMMUNITY): Payer: Self-pay

## 2016-11-15 ENCOUNTER — Encounter (HOSPITAL_COMMUNITY)
Admission: RE | Admit: 2016-11-15 | Discharge: 2016-11-15 | Disposition: A | Discharge status: Home / Self Care | Payer: 59 | Source: Ambulatory Visit | Attending: Specialist | Admitting: Specialist

## 2016-11-15 DIAGNOSIS — Z01818 Encounter for other preprocedural examination: Secondary | ICD-10-CM | POA: Insufficient documentation

## 2016-11-15 DIAGNOSIS — Z01810 Encounter for preprocedural cardiovascular examination: Secondary | ICD-10-CM | POA: Diagnosis not present

## 2016-11-15 DIAGNOSIS — M48061 Spinal stenosis, lumbar region without neurogenic claudication: Secondary | ICD-10-CM | POA: Diagnosis not present

## 2016-11-15 DIAGNOSIS — R001 Bradycardia, unspecified: Secondary | ICD-10-CM | POA: Diagnosis not present

## 2016-11-15 HISTORY — DX: Adverse effect of unspecified anesthetic, initial encounter: T41.45XA

## 2016-11-15 LAB — URINALYSIS, ROUTINE W REFLEX MICROSCOPIC
Bilirubin Urine: NEGATIVE
GLUCOSE, UA: NEGATIVE mg/dL
Hgb urine dipstick: NEGATIVE
Ketones, ur: NEGATIVE mg/dL
LEUKOCYTES UA: NEGATIVE
Nitrite: NEGATIVE
PH: 7 (ref 5.0–8.0)
Protein, ur: NEGATIVE mg/dL
SPECIFIC GRAVITY, URINE: 1.015 (ref 1.005–1.030)

## 2016-11-15 LAB — PROTIME-INR
INR: 0.96
Prothrombin Time: 12.7 seconds (ref 11.4–15.2)

## 2016-11-15 LAB — COMPREHENSIVE METABOLIC PANEL
ALK PHOS: 59 U/L (ref 38–126)
ALT: 20 U/L (ref 17–63)
AST: 26 U/L (ref 15–41)
Albumin: 4.1 g/dL (ref 3.5–5.0)
Anion gap: 6 (ref 5–15)
BUN: 17 mg/dL (ref 6–20)
CALCIUM: 9.4 mg/dL (ref 8.9–10.3)
CHLORIDE: 105 mmol/L (ref 101–111)
CO2: 25 mmol/L (ref 22–32)
CREATININE: 1.23 mg/dL (ref 0.61–1.24)
GFR calc Af Amer: >60 mL/min (ref >60)
GFR calc non Af Amer: >60 mL/min (ref >60)
GLUCOSE: 87 mg/dL (ref 65–99)
Potassium: 4.2 mmol/L (ref 3.5–5.1)
SODIUM: 136 mmol/L (ref 135–145)
Total Bilirubin: 0.7 mg/dL (ref 0.3–1.2)
Total Protein: 7 g/dL (ref 6.5–8.1)

## 2016-11-15 LAB — CBC
HCT: 41.8 % (ref 39.0–52.0)
HEMOGLOBIN: 13.8 g/dL (ref 13.0–17.0)
MCH: 27.8 pg (ref 26.0–34.0)
MCHC: 33 g/dL (ref 30.0–36.0)
MCV: 84.1 fL (ref 78.0–100.0)
PLATELETS: 222 10*3/uL (ref 150–400)
RBC: 4.97 MIL/uL (ref 4.22–5.81)
RDW: 13 % (ref 11.5–15.5)
WBC: 5.1 10*3/uL (ref 4.0–10.5)

## 2016-11-15 LAB — SURGICAL PCR SCREEN
MRSA, PCR: NEGATIVE
STAPHYLOCOCCUS AUREUS: POSITIVE — AB

## 2016-11-15 LAB — APTT: aPTT: 26 seconds (ref 24–36)

## 2016-11-15 NOTE — Pre-Procedure Instructions (Addendum)
Dwayne Acosta  11/15/2016      [id number] [place] [place] [address] [place] [address] Phone: [phone] Fax: [phone]    Your procedure is scheduled on September 18  Report to [facility] at [place].M.  Call this number if you have problems the morning of surgery:  [phone]   Remember:  Do not eat food or drink liquids after midnight.  Continue all other medications as directed by your physician except follow these medication instructions before surgery   Take these medicines the morning of surgery with A SIP OF WATER  traMADol (ULTRAM   STOP Today 11/15/16 taking any Aspirin, Aleve, Naproxen, Ibuprofen, Motrin, Advil, Goody's, BC's, all herbal medications, fish oil, and all vitamins, diclofenac    Do not wear jewelry  Do not wear lotions, powders, or cologne, or deoderant.  Men may shave face and neck.  Do not bring valuables to the hospital.  [facility] is not responsible for any belongings or valuables.  Contacts, dentures or bridgework may not be worn into surgery.  Leave your suitcase in the car.  After surgery it may be brought to your room.  For patients admitted to the hospital, discharge time will be determined by your treatment team.  Patients discharged the day of surgery will not be allowed to drive home.    Special instructions:   - Preparing For Surgery  Before surgery, you can play an important role. Because skin is not sterile, your skin needs to be as free of germs as possible. You can reduce the number of germs on your skin by washing with CHG (chlorahexidine gluconate) Soap before surgery.  CHG is an antiseptic cleaner which kills germs and bonds with the skin to continue killing germs even after washing.  Please do not use if you have an allergy to CHG or antibacterial soaps. If your skin becomes reddened/irritated stop  using the CHG.  Do not shave (including legs and underarms) for at least 48 hours prior to first CHG shower. It is OK to shave your face.  Please follow these instructions carefully.   1. Shower the NIGHT BEFORE SURGERY and the MORNING OF SURGERY with CHG.   2. If you chose to wash your hair, wash your hair first as usual with your normal shampoo.  3. After you shampoo, rinse your hair and body thoroughly to remove the shampoo.  4. Use CHG as you would any other liquid soap. You can apply CHG directly to the skin and wash gently with a scrungie or a clean washcloth.   5. Apply the CHG Soap to your body ONLY FROM THE NECK DOWN.  Do not use on open wounds or open sores. Avoid contact with your eyes, ears, mouth and genitals (private parts). Wash genitals (private parts) with your normal soap.  6. Wash thoroughly, paying special attention to the area where your surgery will be performed.  7. Thoroughly rinse your body with warm water from the neck down.  8. DO NOT shower/wash with your normal soap after using and rinsing off the CHG Soap.  9. Pat yourself dry with a CLEAN TOWEL.   10. Wear CLEAN PAJAMAS   11. Place CLEAN SHEETS on your bed the night of your first shower and DO NOT SLEEP WITH PETS.    Day of Surgery: Do not apply any deodorants/lotions. Please wear clean clothes to the  hospital/surgery center.    r  Please read over the following fact sheets that you were given.

## 2016-11-19 ENCOUNTER — Emergency Department (HOSPITAL_COMMUNITY)
Admission: EM | Admit: 2016-11-19 | Discharge: 2016-11-19 | Disposition: A | Discharge status: Home / Self Care | Payer: 59 | Attending: Emergency Medicine | Admitting: Emergency Medicine

## 2016-11-19 ENCOUNTER — Encounter (HOSPITAL_COMMUNITY): Payer: Self-pay | Admitting: Emergency Medicine

## 2016-11-19 ENCOUNTER — Emergency Department (HOSPITAL_COMMUNITY): Payer: 59

## 2016-11-19 DIAGNOSIS — R109 Unspecified abdominal pain: Secondary | ICD-10-CM | POA: Diagnosis not present

## 2016-11-19 DIAGNOSIS — Z79899 Other long term (current) drug therapy: Secondary | ICD-10-CM | POA: Diagnosis not present

## 2016-11-19 DIAGNOSIS — K2970 Gastritis, unspecified, without bleeding: Secondary | ICD-10-CM | POA: Diagnosis not present

## 2016-11-19 DIAGNOSIS — R140 Abdominal distension (gaseous): Secondary | ICD-10-CM | POA: Diagnosis not present

## 2016-11-19 DIAGNOSIS — R1032 Left lower quadrant pain: Secondary | ICD-10-CM | POA: Diagnosis not present

## 2016-11-19 LAB — CBC WITH DIFFERENTIAL/PLATELET
BASOS ABS: 0 10*3/uL (ref 0.0–0.1)
Basophils Relative: 0 %
EOS PCT: 2 %
Eosinophils Absolute: 0.2 10*3/uL (ref 0.0–0.7)
HCT: 41.7 % (ref 39.0–52.0)
Hemoglobin: 14 g/dL (ref 13.0–17.0)
LYMPHS ABS: 1.1 10*3/uL (ref 0.7–4.0)
LYMPHS PCT: 15 %
MCH: 28.2 pg (ref 26.0–34.0)
MCHC: 33.6 g/dL (ref 30.0–36.0)
MCV: 84.1 fL (ref 78.0–100.0)
Monocytes Absolute: 1.2 10*3/uL — ABNORMAL HIGH (ref 0.1–1.0)
Monocytes Relative: 17 %
Neutro Abs: 4.8 10*3/uL (ref 1.7–7.7)
Neutrophils Relative %: 66 %
PLATELETS: 223 10*3/uL (ref 150–400)
RBC: 4.96 MIL/uL (ref 4.22–5.81)
RDW: 13 % (ref 11.5–15.5)
WBC: 7.4 10*3/uL (ref 4.0–10.5)

## 2016-11-19 LAB — COMPREHENSIVE METABOLIC PANEL
ALT: 17 U/L (ref 17–63)
AST: 26 U/L (ref 15–41)
Albumin: 4.1 g/dL (ref 3.5–5.0)
Alkaline Phosphatase: 65 U/L (ref 38–126)
Anion gap: 8 (ref 5–15)
BUN: 16 mg/dL (ref 6–20)
CHLORIDE: 101 mmol/L (ref 101–111)
CO2: 27 mmol/L (ref 22–32)
Calcium: 9.1 mg/dL (ref 8.9–10.3)
Creatinine, Ser: 1.19 mg/dL (ref 0.61–1.24)
Glucose, Bld: 89 mg/dL (ref 65–99)
POTASSIUM: 4.4 mmol/L (ref 3.5–5.1)
Sodium: 136 mmol/L (ref 135–145)
Total Bilirubin: 0.4 mg/dL (ref 0.3–1.2)
Total Protein: 7.1 g/dL (ref 6.5–8.1)

## 2016-11-19 LAB — URINALYSIS, ROUTINE W REFLEX MICROSCOPIC
BILIRUBIN URINE: NEGATIVE
Bacteria, UA: NONE SEEN
Glucose, UA: NEGATIVE mg/dL
Ketones, ur: NEGATIVE mg/dL
Leukocytes, UA: NEGATIVE
NITRITE: NEGATIVE
PROTEIN: NEGATIVE mg/dL
SPECIFIC GRAVITY, URINE: 1.006 (ref 1.005–1.030)
Squamous Epithelial / HPF: NONE SEEN
WBC UA: NONE SEEN WBC/hpf (ref 0–5)
pH: 6 (ref 5.0–8.0)

## 2016-11-19 MED ORDER — METHOCARBAMOL 500 MG PO TABS
500.0000 mg | ORAL_TABLET | Freq: Once | ORAL | Status: AC
  Administered 2016-11-19: 500 mg via ORAL
  Filled 2016-11-19: qty 1

## 2016-11-19 MED ORDER — OXYCODONE HCL 5 MG PO TABS
5.0000 mg | ORAL_TABLET | Freq: Once | ORAL | Status: AC
  Administered 2016-11-19: 5 mg via ORAL
  Filled 2016-11-19: qty 1

## 2016-11-19 MED ORDER — LIDOCAINE 5 % EX PTCH: 1.0000 | MEDICATED_PATCH | CUTANEOUS | 0 refills | Status: DC

## 2016-11-19 MED ORDER — METHOCARBAMOL 750 MG PO TABS: 750.0000 mg | ORAL_TABLET | Freq: Three times a day (TID) | ORAL | 0 refills | Status: DC | PRN

## 2016-11-19 MED ORDER — LIDOCAINE 5 % EX PTCH
1.0000 | MEDICATED_PATCH | CUTANEOUS | Status: DC
  Administered 2016-11-19: 1 via TRANSDERMAL
  Filled 2016-11-19: qty 1

## 2016-11-19 NOTE — ED Provider Notes (Signed)
[facility] DEPT Provider Note   CSN: [id number] Arrival date & time: 11/19/16  0935     History   Chief Complaint Chief Complaint  Patient presents with  . Abdominal Pain    HPI Dwayne Acosta is a 61 y.o. male.  The history is provided by the patient and medical records. No language interpreter was used.   Dwayne Acosta is a 61 y.o. male who presents to the Emergency Department complaining of left flank pain which began yesterday. Pain will come intermittently and described as a very bad spasm. Pain worse with movement. He took 2 Ultram at home which died down the pain but did not completely resolve it. Patient is followed by orthopedics, Dr. Louanne Skye, for low back pain and scheduled for surgery in 2 days. Patient states this pain is very different from his chronic low back pain.  No history of similar sxs. No nausea, vomiting, diarrhea, constipation, dysuria, urinary urgency/frequency/incontinence, fever, chills or saddle anesthesia.   Past Medical History:  Diagnosis Date  . Complication of anesthesia    pain med caused bladder,stomach issue-unable to void"stomach swoll up"13 yrs ago note of surgery  . Overactive bladder     Patient Active Problem List   Diagnosis Date Noted  . Acute appendicitis 07/20/2016    Past Surgical History:  Procedure Laterality Date  . APPENDECTOMY    . BACK SURGERY  2005  . FOOT SURGERY Left    bunion  . LAPAROSCOPIC APPENDECTOMY N/A 07/20/2016   Procedure: APPENDECTOMY LAPAROSCOPIC;  Surgeon: Armandina Gemma, MD;  Location: [facility];  Service: General;  Laterality: N/A;  . ROTATOR CUFF REPAIR Bilateral        Home Medications    Prior to Admission medications   Medication Sig Start Date End Date Taking? Authorizing Provider  fluticasone (FLONASE) 50 MCG/ACT nasal spray Place 2 sprays into both nostrils at bedtime.   Yes [provider]  mupirocin ointment (BACTROBAN) 2 % Place 1 application into the nose 2 (two) times daily.    Yes [provider]  traMADol (ULTRAM) 50 MG tablet Take 1 tablet (50 mg total) by mouth every 6 (six) hours as needed for moderate pain. 09/27/16  Yes Jessy Oto, MD  VESICARE 10 MG tablet Take 10 mg by mouth at bedtime.  10/13/16  Yes [provider]  latanoprost (XALATAN) 0.005 % ophthalmic solution Place 1 drop into both eyes at bedtime.  10/13/16   [provider]  lidocaine (LIDODERM) 5 % Place 1 patch onto the skin daily. Remove & Discard patch within 12 hours or as directed by MD 11/19/16   Ward, Ozella Almond, PA-C  methocarbamol (ROBAXIN) 750 MG tablet Take 1 tablet (750 mg total) by mouth every 8 (eight) hours as needed for muscle spasms. 11/19/16   Ward, Ozella Almond, PA-C  minoxidil (ROGAINE) 2 % external solution Apply topically 1 day or 1 dose.    [provider]  OVER THE COUNTER MEDICATION Take by mouth 2 (two) times daily. Help hair- pill (pm)and a shake(am)    [provider]  sildenafil (REVATIO) 20 MG tablet TAKE 2-5 TABLETS BY MOUTH ONCE A DAY AS NEEDED FOR SEXUAL ACTIVITY 09/14/16   [provider]    Family History Family History  Problem Relation Age of Onset  . High blood pressure Mother   . Heart attack Father   . Parkinson's disease Father   . Stroke Paternal Grandmother   . Bone cancer Maternal Grandfather  Social History Social History  Substance Use Topics  . Smoking status: Never Smoker  . Smokeless tobacco: Never Used  . Alcohol use 0.0 oz/week     Comment: occ-gin or vodka 1-2 week     Allergies   Patient has no known allergies.   Review of Systems Review of Systems  Gastrointestinal:       + left flank pain  All other systems reviewed and are negative.    Physical Exam Updated Vital Signs BP (!) 113/92 (BP Location: Left Arm)   Pulse 71   Temp 98.4 F (36.9 C) (Oral)   Resp 14   Ht 5\' 9"  (1.753 m)   Wt 84.4 kg (186 lb)   SpO2 97%   BMI 27.47 kg/m   Physical Exam    Constitutional: He is oriented to person, place, and time. He appears well-developed and well-nourished. No distress.  HENT:  Head: Normocephalic and atraumatic.  Cardiovascular: Normal rate, regular rhythm and normal heart sounds.   No murmur heard. Pulmonary/Chest: Effort normal and breath sounds normal. No respiratory distress.  Abdominal: Soft. He exhibits no distension.  Tenderness to palpation along the left flank with no overlying skin changes. No abdominal tenderness.  Musculoskeletal: He exhibits no edema.  5/5 muscle strength in all four extremities.  Neurological: He is alert and oriented to person, place, and time.  Bilateral lower extremities neurovascularly intact.  Skin: Skin is warm and dry.  Nursing note and vitals reviewed.    ED Treatments / Results  Labs (all labs ordered are listed, but only abnormal results are displayed) Labs Reviewed  CBC WITH DIFFERENTIAL/PLATELET - Abnormal; Notable for the following:       Result Value   Monocytes Absolute 1.2 (*)    All other components within normal limits  URINALYSIS, ROUTINE W REFLEX MICROSCOPIC - Abnormal; Notable for the following:    Color, Urine STRAW (*)    Hgb urine dipstick SMALL (*)    All other components within normal limits  COMPREHENSIVE METABOLIC PANEL    EKG  EKG Interpretation None       Radiology Ct Renal Stone Study  Result Date: 11/19/2016 CLINICAL DATA:  Left-sided abdominal pain starting yesterday. EXAM: CT ABDOMEN AND PELVIS WITHOUT CONTRAST TECHNIQUE: Multidetector CT imaging of the abdomen and pelvis was performed following the standard protocol without IV contrast. COMPARISON:  07/20/2016 FINDINGS: Lower chest: Right greater than left base subsegmental atelectasis. Normal heart size without pericardial or pleural effusion. Hepatobiliary: Right hepatic lobe cyst. Vague left hepatic lobe hypoattenuating lesion has been present back to 2013, consistent with a benign etiology. Normal  gallbladder, without biliary ductal dilatation. Pancreas: Normal, without mass or ductal dilatation. Spleen: Normal in size, without focal abnormality. Adrenals/Urinary Tract: Normal left adrenal gland. Right adrenal 9 mm low-density nodule, most consistent with an adenoma. No renal calculi or hydronephrosis. No hydroureter or ureteric calculi. No bladder calculi. Stomach/Bowel: Normal stomach, without wall thickening. Colonic stool burden suggests constipation. Normal terminal ileum. appendectomy. Normal small bowel. Vascular/Lymphatic: Normal caliber of the aorta and branch vessels. No abdominopelvic adenopathy. Reproductive: Normal prostate. Other: No significant free fluid. Musculoskeletal: Lumbosacral spine fixation. IMPRESSION: 1.  No urinary tract calculi or hydronephrosis. 2.  Possible constipation. Electronically Signed   By: Abigail Miyamoto M.D.   On: 11/19/2016 10:57    Procedures Procedures (including critical care time)  Medications Ordered in ED Medications  lidocaine (LIDODERM) 5 % 1 patch (not administered)  oxyCODONE (Oxy IR/ROXICODONE) immediate release tablet 5 mg (  5 mg Oral Given 11/19/16 1017)  methocarbamol (ROBAXIN) tablet 500 mg (500 mg Oral Given 11/19/16 1128)     Initial Impression / Assessment and Plan / ED Course  I have reviewed the triage vital signs and the nursing notes.  Pertinent labs & imaging results that were available during my care of the patient were reviewed by me and considered in my medical decision making (see chart for details).    Dwayne Acosta is a 61 y.o. male who presents to ED for left flank pain. On exam, patient is afebrile, hemodynamically stable with tenderness along left flank. No overlying skin changes / rashes. No abdominal tenderness. Labs reassuring. Urine not infectious. CT renal study shows possible constipation but otherwise unremarkable. Likely musculoskeletal. Patient has follow up with orthopedist for low back pain in 2 days. Will treat  symptomatically and have patient keep this follow up appointment. Reasons to return to ER discussed and all questions answered.   Patient discussed with Dr. Alvino Chapel who agrees with treatment plan.    Final Clinical Impressions(s) / ED Diagnoses   Final diagnoses:  Flank pain    New Prescriptions New Prescriptions   LIDOCAINE (LIDODERM) 5 %    Place 1 patch onto the skin daily. Remove & Discard patch within 12 hours or as directed by MD   METHOCARBAMOL (ROBAXIN) 750 MG TABLET    Take 1 tablet (750 mg total) by mouth every 8 (eight) hours as needed for muscle spasms.     Ward, Ozella Almond, PA-C 11/19/16 1224    Davonna Belling, MD 11/19/16 [phone]

## 2016-11-19 NOTE — ED Triage Notes (Signed)
Per [facility] pt c/o let side abdominal pain onset of yesterday. Pt states pain originated on right side. Pt took 2 ultram today. States he has back surgery scheduled for Tuesday but this is unrelated. Pain 8/10 with a spasm pain.

## 2016-11-19 NOTE — Discharge Instructions (Signed)
It was my pleasure taking care of you today!   Robaxin is your muscle relaxer to take for spasms. Continue taking your ultram as directed by your orthopedist.  Keep your scheduled appointment on Tuesday.   Return to ER for new or worsening symptoms, any additional concerns.

## 2016-11-19 NOTE — ED Notes (Signed)
Bed: UE28 Expected date:  Expected time:  Means of arrival:  Comments: 61 yo abd pain

## 2016-11-20 ENCOUNTER — Inpatient Hospital Stay (HOSPITAL_COMMUNITY): Payer: 59 | Admitting: Certified Registered Nurse Anesthetist

## 2016-11-20 ENCOUNTER — Telehealth (INDEPENDENT_AMBULATORY_CARE_PROVIDER_SITE_OTHER): Payer: Self-pay

## 2016-11-20 DIAGNOSIS — M62838 Other muscle spasm: Secondary | ICD-10-CM | POA: Diagnosis not present

## 2016-11-20 NOTE — Telephone Encounter (Signed)
Patient called stating that he is having surgery in the morning, but he is stating that he is constipated and having muscle spasms.  Stated that he was seen in the ER on 11/19/16.  Cb# is [phone].  Please advise.  Thank You.

## 2016-11-21 ENCOUNTER — Encounter (HOSPITAL_COMMUNITY)
Admission: RE | Disposition: A | Discharge status: Home / Self Care | Payer: Self-pay | Source: Ambulatory Visit | Attending: Specialist

## 2016-11-21 ENCOUNTER — Encounter (HOSPITAL_COMMUNITY): Payer: Self-pay | Admitting: *Deleted

## 2016-11-21 ENCOUNTER — Ambulatory Visit (HOSPITAL_COMMUNITY): Payer: 59

## 2016-11-21 ENCOUNTER — Ambulatory Visit (HOSPITAL_COMMUNITY)
Admission: RE | Admit: 2016-11-21 | Discharge: 2016-11-21 | Disposition: A | Discharge status: Home / Self Care | Payer: 59 | Source: Ambulatory Visit | Attending: Specialist | Admitting: Specialist

## 2016-11-21 ENCOUNTER — Other Ambulatory Visit (INDEPENDENT_AMBULATORY_CARE_PROVIDER_SITE_OTHER): Payer: Self-pay | Admitting: Specialist

## 2016-11-21 DIAGNOSIS — M4316 Spondylolisthesis, lumbar region: Secondary | ICD-10-CM

## 2016-11-21 DIAGNOSIS — M48062 Spinal stenosis, lumbar region with neurogenic claudication: Secondary | ICD-10-CM

## 2016-11-21 DIAGNOSIS — M4302 Spondylolysis, cervical region: Secondary | ICD-10-CM | POA: Diagnosis not present

## 2016-11-21 DIAGNOSIS — G8929 Other chronic pain: Secondary | ICD-10-CM

## 2016-11-21 DIAGNOSIS — M47812 Spondylosis without myelopathy or radiculopathy, cervical region: Secondary | ICD-10-CM

## 2016-11-21 DIAGNOSIS — M5033 Other cervical disc degeneration, cervicothoracic region: Secondary | ICD-10-CM | POA: Diagnosis not present

## 2016-11-21 DIAGNOSIS — M5136 Other intervertebral disc degeneration, lumbar region: Secondary | ICD-10-CM | POA: Insufficient documentation

## 2016-11-21 DIAGNOSIS — M542 Cervicalgia: Secondary | ICD-10-CM

## 2016-11-21 DIAGNOSIS — M5441 Lumbago with sciatica, right side: Secondary | ICD-10-CM

## 2016-11-21 DIAGNOSIS — M5030 Other cervical disc degeneration, unspecified cervical region: Secondary | ICD-10-CM | POA: Insufficient documentation

## 2016-11-21 DIAGNOSIS — K5900 Constipation, unspecified: Secondary | ICD-10-CM | POA: Diagnosis not present

## 2016-11-21 HISTORY — DX: Other cervical disc degeneration, unspecified cervical region: M50.30

## 2016-11-21 HISTORY — DX: Spondylosis without myelopathy or radiculopathy, cervical region: M47.812

## 2016-11-21 LAB — CBC WITH DIFFERENTIAL/PLATELET
Basophils Absolute: 0 10*3/uL (ref 0.0–0.1)
Basophils Relative: 0 %
EOS PCT: 0 %
Eosinophils Absolute: 0 10*3/uL (ref 0.0–0.7)
HCT: 38.7 % — ABNORMAL LOW (ref 39.0–52.0)
Hemoglobin: 13.1 g/dL (ref 13.0–17.0)
LYMPHS ABS: 0.9 10*3/uL (ref 0.7–4.0)
LYMPHS PCT: 9 %
MCH: 28.1 pg (ref 26.0–34.0)
MCHC: 33.9 g/dL (ref 30.0–36.0)
MCV: 83 fL (ref 78.0–100.0)
MONO ABS: 1.9 10*3/uL — AB (ref 0.1–1.0)
Monocytes Relative: 18 %
Neutro Abs: 7.7 10*3/uL (ref 1.7–7.7)
Neutrophils Relative %: 73 %
PLATELETS: 203 10*3/uL (ref 150–400)
RBC: 4.66 MIL/uL (ref 4.22–5.81)
RDW: 12.8 % (ref 11.5–15.5)
WBC: 10.5 10*3/uL (ref 4.0–10.5)

## 2016-11-21 LAB — COMPREHENSIVE METABOLIC PANEL
ALT: 15 U/L — ABNORMAL LOW (ref 17–63)
AST: 20 U/L (ref 15–41)
Albumin: 3.6 g/dL (ref 3.5–5.0)
Alkaline Phosphatase: 62 U/L (ref 38–126)
Anion gap: 7 (ref 5–15)
BUN: 10 mg/dL (ref 6–20)
CHLORIDE: 98 mmol/L — AB (ref 101–111)
CO2: 28 mmol/L (ref 22–32)
Calcium: 9.1 mg/dL (ref 8.9–10.3)
Creatinine, Ser: 1.19 mg/dL (ref 0.61–1.24)
GFR calc non Af Amer: >60 mL/min (ref >60)
Glucose, Bld: 105 mg/dL — ABNORMAL HIGH (ref 65–99)
POTASSIUM: 4.4 mmol/L (ref 3.5–5.1)
Sodium: 133 mmol/L — ABNORMAL LOW (ref 135–145)
Total Bilirubin: 0.9 mg/dL (ref 0.3–1.2)
Total Protein: 7 g/dL (ref 6.5–8.1)

## 2016-11-21 LAB — AMYLASE: AMYLASE: 55 U/L (ref 28–100)

## 2016-11-21 LAB — LIPASE, BLOOD: LIPASE: 22 U/L (ref 11–51)

## 2016-11-21 SURGERY — POSTERIOR LUMBAR FUSION 1 LEVEL
Anesthesia: General

## 2016-11-21 MED ORDER — CHLORHEXIDINE GLUCONATE 4 % EX LIQD: 60.0000 mL | Freq: Once | CUTANEOUS | Status: DC

## 2016-11-21 MED ORDER — POLYETHYLENE GLYCOL 3350 17 G PO PACK: 17.0000 g | PACK | Freq: Every day | ORAL | Status: AC

## 2016-11-21 MED ORDER — THROMBIN 20000 UNITS EX SOLR
CUTANEOUS | Status: AC
  Filled 2016-11-21: qty 20000

## 2016-11-21 MED ORDER — BUPIVACAINE HCL (PF) 0.5 % IJ SOLN
INTRAMUSCULAR | Status: AC
  Filled 2016-11-21: qty 30

## 2016-11-21 MED ORDER — CLONAZEPAM 0.5 MG PO TABS: 0.5000 mg | ORAL_TABLET | Freq: Two times a day (BID) | ORAL | 0 refills | Status: DC | PRN

## 2016-11-21 MED ORDER — BUPIVACAINE LIPOSOME 1.3 % IJ SUSP
20.0000 mL | Freq: Once | INTRAMUSCULAR | Status: DC
  Filled 2016-11-21: qty 20

## 2016-11-21 MED ORDER — DEXAMETHASONE SODIUM PHOSPHATE 10 MG/ML IJ SOLN
6.0000 mg | Freq: Once | INTRAMUSCULAR | Status: AC
  Administered 2016-11-21: 6 mg via INTRAVENOUS

## 2016-11-21 MED ORDER — FENTANYL CITRATE (PF) 250 MCG/5ML IJ SOLN
INTRAMUSCULAR | Status: AC
Start: 2016-11-21 — End: 2016-11-21
  Filled 2016-11-21: qty 5

## 2016-11-21 MED ORDER — FENTANYL CITRATE (PF) 100 MCG/2ML IJ SOLN
INTRAMUSCULAR | Status: DC | PRN
  Administered 2016-11-21: 50 ug via INTRAVENOUS

## 2016-11-21 MED ORDER — METHYLPREDNISOLONE 4 MG PO TBPK: ORAL_TABLET | ORAL | 0 refills | Status: DC

## 2016-11-21 MED ORDER — CEFAZOLIN SODIUM-DEXTROSE 2-4 GM/100ML-% IV SOLN
2.0000 g | INTRAVENOUS | Status: DC
  Filled 2016-11-21: qty 100

## 2016-11-21 MED ORDER — DEXAMETHASONE SODIUM PHOSPHATE 10 MG/ML IJ SOLN
INTRAMUSCULAR | Status: AC
  Administered 2016-11-21: 6 mg via INTRAVENOUS
  Filled 2016-11-21: qty 1

## 2016-11-21 MED ORDER — PROPOFOL 10 MG/ML IV BOLUS
INTRAVENOUS | Status: AC
  Filled 2016-11-21: qty 20

## 2016-11-21 MED ORDER — MIDAZOLAM HCL 2 MG/2ML IJ SOLN
INTRAMUSCULAR | Status: AC
  Filled 2016-11-21: qty 2

## 2016-11-21 MED ORDER — MIDAZOLAM HCL 5 MG/5ML IJ SOLN
INTRAMUSCULAR | Status: DC | PRN
  Administered 2016-11-21: 2 mg via INTRAVENOUS

## 2016-11-21 NOTE — Consult Note (Signed)
Chief complaint:Cervicalgia, abdomenal discomfort Consulting Physician:Dwayne Acosta likely secondary to cervical degenerative disc disease and spondylosis, abdomenal discomfort. Lumbar degenerative disc disease, Grade 1 spondylolisthesis L4-5 above L5-S1 fusion with Modic changes and foramenal stenosis. Recent GI complaints.   VPX:[name] is an 61 y.o. male.Chief Complaint: Neck Pain, Recent abdomenal pain. HPI:61 year old male with grade one spondylolisthesis,foramenal stenosis. He is scheduled for Lumbar fusion and decompression this AM. He apparently began experiencing  Problems with abdomenal discomfort last week and he presented to the ER. Had work up for a possible kidney stone. During this time he also began having severe neck pain and Spasm. He reports that he can not move his neck. No accident, no recent cold or flu. No arm pain, numbness or weakness. No cough or fever or chill. Recent appendectomy by  Dr. Harlow Acosta 07/2016. He has a pattern of neurogenic claudication that suggest that the stenosis and lumbar spondylolisthesis is the cause. However the current level of discomfort appears to Be more cervical than lumbar. Has reportedly had a bowel movement since work up in the ER 9/16 including an unenhanced CT of the abdomen.  CBC, CMET and UA were unremarkable. He saw his primary care physician Dr. Armandina Acosta and was given some medications for spasm. He reports that he is having problems moving his neck and is unable to drive or work. No history of previous neck pain.    Past Medical History:  Diagnosis Date  . Complication of anesthesia    pain med caused bladder,stomach issue-unable to void"stomach swoll up"13 yrs ago note of surgery  . Overactive bladder     Past Surgical History:  Procedure Laterality Date  . APPENDECTOMY    . BACK SURGERY  2005  . FOOT SURGERY Left    bunion  . LAPAROSCOPIC APPENDECTOMY N/A 07/20/2016   Procedure:  APPENDECTOMY LAPAROSCOPIC;  Surgeon: Dwayne Gemma, MD;  Location: [facility];  Service: General;  Laterality: N/A;  . ROTATOR CUFF REPAIR Bilateral     Family History  Problem Relation Age of Onset  . High blood pressure Mother   . Heart attack Father   . Parkinson's disease Father   . Stroke Paternal Grandmother   . Bone cancer Maternal Grandfather     Social History:  reports that he has never smoked. He has never used smokeless tobacco. He reports that he drinks alcohol. He reports that he does not use drugs.  Allergies: No Known Allergies  Medications:  Prior to Admission:  No prescriptions prior to admission.    Results for orders placed or performed during the hospital encounter of 11/19/16 (from the past 48 hour(s))  CBC with Differential     Status: Abnormal   Collection Time: 11/19/16 10:20 AM  Result Value Ref Range   WBC 7.4 4.0 - 10.5 K/uL   RBC 4.96 4.22 - 5.81 MIL/uL   Hemoglobin 14.0 13.0 - 17.0 g/dL   HCT 41.7 39.0 - 52.0 %   MCV 84.1 78.0 - 100.0 fL   MCH 28.2 26.0 - 34.0 pg   MCHC 33.6 30.0 - 36.0 g/dL   RDW 13.0 11.5 - 15.5 %   Platelets 223 150 - 400 K/uL   Neutrophils Relative % 66 %   Neutro Abs 4.8 1.7 - 7.7 K/uL   Lymphocytes Relative 15 %   Lymphs Abs 1.1 0.7 - 4.0 K/uL   Monocytes Relative 17 %   Monocytes Absolute 1.2 (H) 0.1 - 1.0 K/uL  Eosinophils Relative 2 %   Eosinophils Absolute 0.2 0.0 - 0.7 K/uL   Basophils Relative 0 %   Basophils Absolute 0.0 0.0 - 0.1 K/uL  Comprehensive metabolic panel     Status: None   Collection Time: 11/19/16 10:20 AM  Result Value Ref Range   Sodium 136 135 - 145 mmol/L   Potassium 4.4 3.5 - 5.1 mmol/L   Chloride 101 101 - 111 mmol/L   CO2 27 22 - 32 mmol/L   Glucose, Bld 89 65 - 99 mg/dL   BUN 16 6 - 20 mg/dL   Creatinine, Ser 1.19 0.61 - 1.24 mg/dL   Calcium 9.1 8.9 - 10.3 mg/dL   Total Protein 7.1 6.5 - 8.1 g/dL   Albumin 4.1 3.5 - 5.0 g/dL   AST 26 15 - 41 U/L   ALT 17 17 - 63 U/L   Alkaline  Phosphatase 65 38 - 126 U/L   Total Bilirubin 0.4 0.3 - 1.2 mg/dL   GFR calc non Af Amer >60 >60 mL/min   GFR calc Af Amer >60 >60 mL/min    Comment: (NOTE) The eGFR has been calculated using the CKD EPI equation. This calculation has not been validated in all clinical situations. eGFR's persistently <60 mL/min signify possible Chronic Kidney Disease.    Anion gap 8 5 - 15  Urinalysis, Routine w reflex microscopic     Status: Abnormal   Collection Time: 11/19/16 10:20 AM  Result Value Ref Range   Color, Urine STRAW (A) YELLOW   APPearance CLEAR CLEAR   Specific Gravity, Urine 1.006 1.005 - 1.030   pH 6.0 5.0 - 8.0   Glucose, UA NEGATIVE NEGATIVE mg/dL   Hgb urine dipstick SMALL (A) NEGATIVE   Bilirubin Urine NEGATIVE NEGATIVE   Ketones, ur NEGATIVE NEGATIVE mg/dL   Protein, ur NEGATIVE NEGATIVE mg/dL   Nitrite NEGATIVE NEGATIVE   Leukocytes, UA NEGATIVE NEGATIVE   RBC / HPF 0-5 0 - 5 RBC/hpf   WBC, UA NONE SEEN 0 - 5 WBC/hpf   Bacteria, UA NONE SEEN NONE SEEN   Squamous Epithelial / LPF NONE SEEN NONE SEEN    Ct Renal Stone Study  Result Date: 11/19/2016 CLINICAL DATA:  Left-sided abdominal pain starting yesterday. EXAM: CT ABDOMEN AND PELVIS WITHOUT CONTRAST TECHNIQUE: Multidetector CT imaging of the abdomen and pelvis was performed following the standard protocol without IV contrast. COMPARISON:  07/20/2016 FINDINGS: Lower chest: Right greater than left base subsegmental atelectasis. Normal heart size without pericardial or pleural effusion. Hepatobiliary: Right hepatic lobe cyst. Vague left hepatic lobe hypoattenuating lesion has been present back to 2013, consistent with a benign etiology. Normal gallbladder, without biliary ductal dilatation. Pancreas: Normal, without mass or ductal dilatation. Spleen: Normal in size, without focal abnormality. Adrenals/Urinary Tract: Normal left adrenal gland. Right adrenal 9 mm low-density nodule, most consistent with an adenoma. No renal  calculi or hydronephrosis. No hydroureter or ureteric calculi. No bladder calculi. Stomach/Bowel: Normal stomach, without wall thickening. Colonic stool burden suggests constipation. Normal terminal ileum. appendectomy. Normal small bowel. Vascular/Lymphatic: Normal caliber of the aorta and branch vessels. No abdominopelvic adenopathy. Reproductive: Normal prostate. Other: No significant free fluid. Musculoskeletal: Lumbosacral spine fixation. IMPRESSION: 1.  No urinary tract calculi or hydronephrosis. 2.  Possible constipation. Electronically Signed   By: Dwayne Acosta M.D.   On: 11/19/2016 10:57    Review of Systems  Constitutional: Negative for chills, fever and weight loss.  HENT: Negative.   Eyes: Negative.  Negative for double vision,  photophobia, pain and redness.  Respiratory: Negative for cough, hemoptysis, sputum production, shortness of breath and wheezing.   Cardiovascular: Positive for claudication. Negative for chest pain and palpitations.  Gastrointestinal: Positive for abdominal pain and constipation. Negative for blood in stool, heartburn, melena, nausea and vomiting.  Genitourinary: Positive for dysuria, frequency and urgency. Negative for flank pain and hematuria.  Musculoskeletal: Positive for back pain, myalgias and neck pain. Negative for falls and joint pain.  Skin: Negative.   Neurological: Positive for headaches. Negative for tingling, tremors, sensory change, speech change, focal weakness, seizures and weakness.  Endo/Heme/Allergies: Negative for environmental allergies and polydipsia. Does not bruise/bleed easily.  Psychiatric/Behavioral: Negative for depression, hallucinations, memory loss, substance abuse and suicidal ideas. The patient is nervous/anxious and has insomnia.    Blood pressure (!) 148/89, pulse 77, temperature 99 F (37.2 C), temperature source Oral, resp. rate 18, height 5' 9"  (1.753 m), weight 186 lb (84.4 kg), SpO2 99 %. Physical Exam General: Alert,  no acute distress Cardiovascular: No pedal edema Respiratory: No cyanosis, no use of accessory musculature GI: No organomegaly, abdomen is soft and non-tender Skin: No lesions in the area of chief complaint Neurologic: Sensation intact distally Psychiatric: Patient is competent for consent with normal mood and affect He does appear less painful than he describes. Legs crossed. Lymphatic: No axillary or cervical lymphadenopathy   Orthopaedic Exam: ROM of the cervical spine is guarded with inability to bend neck forward 4 fingerbreaths from the sternum, tenderness over the trapezius bilateral. Upper extremity motor is intact. Hoffman's sign is negative. Lower extremity motor is normal. Patient is lying on [place] in the preop holding area with oxygen per nasal cannula and appears comfortable despite complaints of severe pain. He is awake and alert. Wants to know if he can be admitted.   Assessment/Plan:Orthopedic Diagnosis:Cervicalgia likely due to cervical spondylosis and degenerative disc disease, abdomenal discomfort. Lumbar degenerative disc disease, Grade 1 spondylolisthesis L4-5 above L5-S1 fusion with Modic changes and foramenal stenosis.  Recent GI complaints.  Plan: Due to recent and ongoing complaints of neck pain with guarding today's surgery is cancelled to undergo work up and Out patient treatment of cervicalgia. Laboratory including repeat CBC with differential, CMET and Amylase and lipase ordered. KUB and Cervical spine radiographs ordered. This is discussed with this patient and his family. Results of the studies reviewed with patient and his family. Cervical DDD and spondylosis 3 levels, will prescribe a medrol dose pak and give IV 60m of decadron today. Klonopin for muscle spasm and soft C-collar. PT order for therapy at the neuro[facility]habilitation center. See back within the week to reassess.   JSharyne Richters9/18/2018, 8:10 AM

## 2016-11-21 NOTE — Telephone Encounter (Signed)
Discussed at preop evaluation area prior to his surgery. He is experiencing neck pain and abdomenal discomfort such that I think we will hold surgery and evaluate his cervicalgia complaints. jen

## 2016-11-21 NOTE — Telephone Encounter (Signed)
Seen in preop holding area with complaints of neck pain and a problem of  Constipation that apparently is giving him abdomenal pain. He was examined and found to have severe cervicalgia with quite a bit of stiffness and decrease ROM of the neck. Due to his level of neck pain his surgery was cancelled. He needs an appointment to be seen within 7-8 days to assess his progress. I ordered a medrol dose pak, some clonazepam for muscular spasm and therapy at the [facility]. [facility] on  [address]. He is to use a soft collar for 3-4 days then discontinue. jen

## 2016-11-21 NOTE — Telephone Encounter (Signed)
Patient called stating that he is having surgery in the morning, but he is stating that he is constipated and having muscle spasms.  Stated that he was seen in the ER on 11/19/16.

## 2016-11-21 NOTE — Anesthesia Preprocedure Evaluation (Signed)
Anesthesia Evaluation  Patient identified by MRN, date of birth, ID band Patient awake    Reviewed: Allergy & Precautions, H&P , NPO status , Patient's Chart, lab work & pertinent test results  Airway Mallampati: II   Neck ROM: full    Dental   Pulmonary neg pulmonary ROS,    breath sounds clear to auscultation       Cardiovascular negative cardio ROS   Rhythm:regular Rate:Normal     Neuro/Psych  Neuromuscular disease    GI/Hepatic   Endo/Other    Renal/GU      Musculoskeletal   Abdominal   Peds  Hematology   Anesthesia Other Findings   Reproductive/Obstetrics                             Anesthesia Physical Anesthesia Plan  ASA: II  Anesthesia Plan: General   Post-op Pain Management:    Induction: Intravenous  PONV Risk Score and Plan: 2 and Ondansetron, Dexamethasone and Treatment may vary due to age or medical condition  Airway Management Planned: Oral ETT  Additional Equipment:   Intra-op Plan:   Post-operative Plan: Extubation in OR  Informed Consent: I have reviewed the patients History and Physical, chart, labs and discussed the procedure including the risks, benefits and alternatives for the proposed anesthesia with the patient or authorized representative who has indicated his/her understanding and acceptance.     Plan Discussed with: CRNA, Anesthesiologist and Surgeon  Anesthesia Plan Comments:         Anesthesia Quick Evaluation

## 2016-11-24 ENCOUNTER — Telehealth (INDEPENDENT_AMBULATORY_CARE_PROVIDER_SITE_OTHER): Payer: Self-pay | Admitting: Specialist

## 2016-11-24 NOTE — Telephone Encounter (Signed)
Patient stated the Dr. Louanne Skye wanted him to be re-assessed since his surgery was cancelled. He states he is feeling better but wanted someone to call him so he would know what to do.

## 2016-11-24 NOTE — Telephone Encounter (Signed)
I called an advised that we could see him in the office on 11/30/16 @1045am .  He agreed and we will see him then and we will resched surgery after his eval.

## 2016-11-30 ENCOUNTER — Ambulatory Visit (INDEPENDENT_AMBULATORY_CARE_PROVIDER_SITE_OTHER): Payer: 59 | Admitting: Specialist

## 2016-11-30 ENCOUNTER — Encounter (INDEPENDENT_AMBULATORY_CARE_PROVIDER_SITE_OTHER): Payer: Self-pay | Admitting: Specialist

## 2016-11-30 VITALS — BP 118/83 | HR 49 | Ht 69.0 in | Wt 180.0 lb

## 2016-11-30 DIAGNOSIS — M48062 Spinal stenosis, lumbar region with neurogenic claudication: Secondary | ICD-10-CM | POA: Diagnosis not present

## 2016-11-30 DIAGNOSIS — M5416 Radiculopathy, lumbar region: Secondary | ICD-10-CM

## 2016-11-30 DIAGNOSIS — M47812 Spondylosis without myelopathy or radiculopathy, cervical region: Secondary | ICD-10-CM

## 2016-11-30 NOTE — Patient Instructions (Signed)
Plan: Avoid bending, stooping and avoid lifting weights greater than 10 lbs. Avoid prolong standing and walking. Order for a new walker with wheels. Surgery scheduling secretary Kandice Hams, will call you in the next week to schedule for surgery.  Surgery recommended is a one level lumbar fusion L4-5 this would be done with rods, screws and a cage with local bone graft and allograft (donor bone graft). Take hydrocodone for for pain. Risk of surgery includes risk of infection 1 in 200 patients, bleeding 1/2% chance you would need a transfusion.   Risk to the nerves is one in 10,000. You will need to use a brace for 3 months and wean from the brace on the 4th month. Expect improved walking and standing tolerance. Expect relief of leg pain but numbness may persist depending on the length and degree of pressure that has been presen

## 2016-11-30 NOTE — Progress Notes (Signed)
Office Visit Note   Patient: Dwayne Acosta           Date of Birth: Oct 06, 1955           MRN: [id number] Visit Date: 11/30/2016              Requested by: [place], [facility] [place], [place] [id number] PCP: [facility]   Assessment & Plan: Visit Diagnoses:  1. Right lumbar radiculopathy   2. Spinal stenosis of lumbar region with neurogenic claudication   3. Spondylosis without myelopathy or radiculopathy, cervical region     Plan: Avoid bending, stooping and avoid lifting weights greater than 10 lbs. Avoid prolong standing and walking. Order for a new walker with wheels. Surgery scheduling secretary Kandice Hams, will call you in the next week to schedule for surgery.  Surgery recommended is a one level lumbar fusion L4-5 this would be done with rods, screws and a cage with local bone graft and allograft (donor bone graft). Take hydrocodone for for pain. Risk of surgery includes risk of infection 1 in 200 patients, bleeding 1/2% chance you would need a transfusion.   Risk to the nerves is one in 10,000. You will need to use a brace for 3 months and wean from the brace on the 4th month. Expect improved walking and standing tolerance. Expect relief of leg pain but numbness may persist depending on the length and degree of pressure that has been present.    Follow-Up Instructions: Return in about 4 weeks (around 12/28/2016).   Orders:  No orders of the defined types were placed in this encounter.  No orders of the defined types were placed in this encounter.     Procedures: No procedures performed   Clinical Data: No additional findings.   Subjective: Chief Complaint  Patient presents with  . Neck - Follow-up    61 year old male with history of low back pain and radiation into the right thigh and anterior calf. He has pain with standing and walking and with bending and stooping. He was to undergo a right  TLIF at the L4-5 level for foramenal stenosis and adjacent level degenerative disc disease with disc height loss. No arm pain numbness or tingling was seen in the preop holding area prior to the  TLIF having severe neck pain and pain in his abdomen. Surgery was canceled and he was placed on a steroid dose pack and rest with a soft collar. He returns today in follow up. No bowel or  Bladder difficulty. He has used Miralax to relieve the constipation and has stopped his narcotics. Still has to sit down to relieve right leg pain and sciatica. After a couple moments pain and numbness improves and he is able to stand and walk again a short distance.     Review of Systems  Constitutional: Negative.   HENT: Negative.   Eyes: Negative.   Respiratory: Negative.   Cardiovascular: Negative.   Gastrointestinal: Negative.   Endocrine: Negative.   Genitourinary: Negative.   Musculoskeletal: Negative.   Skin: Negative.   Allergic/Immunologic: Negative.   Neurological: Negative.   Hematological: Negative.   Psychiatric/Behavioral: Negative.      Objective: Vital Signs: BP 118/83 (BP Location: Left Arm, Patient Position: Sitting)   Pulse (!) 49   Ht 5\' 9"  (1.753 m)   Wt 180 lb (81.6 kg)   BMI 26.58 kg/m   Physical Exam  Back Exam   Tenderness  The  patient is experiencing tenderness in the lumbar.  Range of Motion  Extension: abnormal  Flexion: abnormal  Lateral Bend Right: abnormal  Lateral Bend Left: abnormal  Rotation Right: abnormal  Rotation Left: abnormal   Muscle Strength  Right Quadriceps:  5/5  Left Quadriceps:  5/5  Right Hamstrings:  5/5  Left Hamstrings:  5/5   Tests  Straight leg raise right: negative Straight leg raise left: negative  Reflexes  Patellar:  1/4 abnormal Achilles: 2/4 Babinski's sign: normal   Other  Toe Walk: normal Heel Walk: normal Sensation: normal Gait: drop-foot  Erythema: no back redness Scars: absent  Comments:  Right foot DF  weakness 4/5, right quadriceps weakness 5-/5      Specialty Comments:  No specialty comments available.  Imaging: No results found.   PMFS History: Patient Active Problem List   Diagnosis Date Noted  . Degenerative disc disease, cervical 11/21/2016  . Spondylosis without myelopathy or radiculopathy, cervical region 11/21/2016  . Acute appendicitis 07/20/2016   Past Medical History:  Diagnosis Date  . Complication of anesthesia    pain med caused bladder,stomach issue-unable to void"stomach swoll up"13 yrs ago note of surgery  . Overactive bladder     Family History  Problem Relation Age of Onset  . High blood pressure Mother   . Heart attack Father   . Parkinson's disease Father   . Stroke Paternal Grandmother   . Bone cancer Maternal Grandfather     Past Surgical History:  Procedure Laterality Date  . APPENDECTOMY    . BACK SURGERY  2005  . FOOT SURGERY Left    bunion  . LAPAROSCOPIC APPENDECTOMY N/A 07/20/2016   Procedure: APPENDECTOMY LAPAROSCOPIC;  Surgeon: Armandina Gemma, MD;  Location: [facility];  Service: General;  Laterality: N/A;  . ROTATOR CUFF REPAIR Bilateral    Social History   Occupational History  .  [employer]   Social History Main Topics  . Smoking status: Never Smoker  . Smokeless tobacco: Never Used  . Alcohol use 0.0 oz/week     Comment: occ-gin or vodka 1-2 week  . Drug use: No  . Sexual activity: Not on file

## 2016-12-04 ENCOUNTER — Encounter (HOSPITAL_COMMUNITY): Payer: Self-pay | Admitting: *Deleted

## 2016-12-04 MED ORDER — OXYCODONE HCL 5 MG/5ML PO SOLN: 5.0000 mg | Freq: Once | ORAL | Status: DC | PRN

## 2016-12-04 MED ORDER — HYDROMORPHONE HCL 1 MG/ML IJ SOLN: 0.2500 mg | INTRAMUSCULAR | Status: DC | PRN

## 2016-12-04 MED ORDER — ONDANSETRON HCL 4 MG/2ML IJ SOLN
4.0000 mg | Freq: Four times a day (QID) | INTRAMUSCULAR | Status: DC | PRN
Start: 2016-12-04 — End: 2016-12-04

## 2016-12-04 MED ORDER — OXYCODONE HCL 5 MG PO TABS: 5.0000 mg | ORAL_TABLET | Freq: Once | ORAL | Status: DC | PRN

## 2016-12-04 NOTE — Progress Notes (Signed)
Mr Garman denies chest pain or shortness. Mr Rains had a "bowel blockage post op back surgery in 2004 and had a NG tube in"  Patient thinks that it was from a bolus medication that the was given.  Patient reports that his arm started hurting as soon as the medication was given and then he ended up with bowel issues.  I explained to patient that anesthesia and pain medication can slow bowel function and that the pain in his arm possibly is not related to the bowel issues.  Patient also asked if he could have a prescription for Movantik post op.  I sent Dr Louanne Skye an inbox message with patient's request.   I called Sherri at Dr Otho Ket office and asked for orders for surgery and aasked if they have any post oprecords from 2004 surgery that might indicate what medication patient had issues with

## 2016-12-05 ENCOUNTER — Inpatient Hospital Stay (HOSPITAL_COMMUNITY): Payer: 59 | Admitting: Anesthesiology

## 2016-12-05 ENCOUNTER — Inpatient Hospital Stay (HOSPITAL_COMMUNITY): Payer: 59

## 2016-12-05 ENCOUNTER — Inpatient Hospital Stay (HOSPITAL_COMMUNITY)
Admission: RE | Admit: 2016-12-05 | Discharge: 2016-12-08 | DRG: 460 | Disposition: A | Discharge status: Home Health | Payer: 59 | Source: Ambulatory Visit | Attending: Specialist | Admitting: Specialist

## 2016-12-05 ENCOUNTER — Encounter (HOSPITAL_COMMUNITY): Payer: Self-pay

## 2016-12-05 ENCOUNTER — Encounter (HOSPITAL_COMMUNITY)
Admission: RE | Disposition: A | Discharge status: Home Health | Payer: Self-pay | Source: Ambulatory Visit | Attending: Specialist

## 2016-12-05 DIAGNOSIS — Z79899 Other long term (current) drug therapy: Secondary | ICD-10-CM | POA: Diagnosis not present

## 2016-12-05 DIAGNOSIS — M542 Cervicalgia: Secondary | ICD-10-CM

## 2016-12-05 DIAGNOSIS — F419 Anxiety disorder, unspecified: Secondary | ICD-10-CM | POA: Diagnosis present

## 2016-12-05 DIAGNOSIS — M4316 Spondylolisthesis, lumbar region: Principal | ICD-10-CM | POA: Diagnosis present

## 2016-12-05 DIAGNOSIS — M48061 Spinal stenosis, lumbar region without neurogenic claudication: Secondary | ICD-10-CM | POA: Diagnosis present

## 2016-12-05 DIAGNOSIS — M47812 Spondylosis without myelopathy or radiculopathy, cervical region: Secondary | ICD-10-CM

## 2016-12-05 DIAGNOSIS — M4317 Spondylolisthesis, lumbosacral region: Secondary | ICD-10-CM | POA: Diagnosis not present

## 2016-12-05 DIAGNOSIS — Y838 Other surgical procedures as the cause of abnormal reaction of the patient, or of later complication, without mention of misadventure at the time of the procedure: Secondary | ICD-10-CM | POA: Diagnosis not present

## 2016-12-05 DIAGNOSIS — R509 Fever, unspecified: Secondary | ICD-10-CM

## 2016-12-05 DIAGNOSIS — R269 Unspecified abnormalities of gait and mobility: Secondary | ICD-10-CM | POA: Diagnosis not present

## 2016-12-05 DIAGNOSIS — M48062 Spinal stenosis, lumbar region with neurogenic claudication: Secondary | ICD-10-CM | POA: Diagnosis not present

## 2016-12-05 DIAGNOSIS — M9689 Other intraoperative and postprocedural complications and disorders of the musculoskeletal system: Secondary | ICD-10-CM | POA: Diagnosis not present

## 2016-12-05 DIAGNOSIS — N3281 Overactive bladder: Secondary | ICD-10-CM | POA: Diagnosis present

## 2016-12-05 DIAGNOSIS — Z419 Encounter for procedure for purposes other than remedying health state, unspecified: Secondary | ICD-10-CM

## 2016-12-05 DIAGNOSIS — Z981 Arthrodesis status: Secondary | ICD-10-CM | POA: Diagnosis not present

## 2016-12-05 DIAGNOSIS — M4807 Spinal stenosis, lumbosacral region: Secondary | ICD-10-CM | POA: Diagnosis not present

## 2016-12-05 DIAGNOSIS — M4326 Fusion of spine, lumbar region: Secondary | ICD-10-CM | POA: Diagnosis not present

## 2016-12-05 DIAGNOSIS — Y92234 Operating room of hospital as the place of occurrence of the external cause: Secondary | ICD-10-CM | POA: Diagnosis not present

## 2016-12-05 DIAGNOSIS — M5030 Other cervical disc degeneration, unspecified cervical region: Secondary | ICD-10-CM | POA: Diagnosis not present

## 2016-12-05 DIAGNOSIS — R079 Chest pain, unspecified: Secondary | ICD-10-CM | POA: Diagnosis not present

## 2016-12-05 DIAGNOSIS — M4726 Other spondylosis with radiculopathy, lumbar region: Secondary | ICD-10-CM | POA: Diagnosis not present

## 2016-12-05 HISTORY — DX: Anxiety disorder, unspecified: F41.9

## 2016-12-05 HISTORY — PX: LUMBAR FUSION: SHX111

## 2016-12-05 HISTORY — DX: Spinal stenosis, lumbar region with neurogenic claudication: M48.062

## 2016-12-05 HISTORY — DX: Overactive bladder: N32.81

## 2016-12-05 LAB — COMPREHENSIVE METABOLIC PANEL
ALBUMIN: 3.3 g/dL — AB (ref 3.5–5.0)
ALT: 18 U/L (ref 17–63)
ANION GAP: 7 (ref 5–15)
AST: 23 U/L (ref 15–41)
Alkaline Phosphatase: 43 U/L (ref 38–126)
BUN: 15 mg/dL (ref 6–20)
CHLORIDE: 107 mmol/L (ref 101–111)
CO2: 24 mmol/L (ref 22–32)
Calcium: 8.8 mg/dL — ABNORMAL LOW (ref 8.9–10.3)
Creatinine, Ser: 1.03 mg/dL (ref 0.61–1.24)
GFR calc Af Amer: >60 mL/min (ref >60)
GFR calc non Af Amer: >60 mL/min (ref >60)
GLUCOSE: 91 mg/dL (ref 65–99)
POTASSIUM: 3.8 mmol/L (ref 3.5–5.1)
SODIUM: 138 mmol/L (ref 135–145)
Total Bilirubin: 0.5 mg/dL (ref 0.3–1.2)
Total Protein: 5.9 g/dL — ABNORMAL LOW (ref 6.5–8.1)

## 2016-12-05 LAB — APTT: APTT: 29 s (ref 24–36)

## 2016-12-05 LAB — TYPE AND SCREEN
ABO/RH(D): A NEG
Antibody Screen: NEGATIVE

## 2016-12-05 LAB — CBC
HEMATOCRIT: 37.4 % — AB (ref 39.0–52.0)
Hemoglobin: 12.2 g/dL — ABNORMAL LOW (ref 13.0–17.0)
MCH: 27.2 pg (ref 26.0–34.0)
MCHC: 32.6 g/dL (ref 30.0–36.0)
MCV: 83.3 fL (ref 78.0–100.0)
Platelets: 276 10*3/uL (ref 150–400)
RBC: 4.49 MIL/uL (ref 4.22–5.81)
RDW: 13.3 % (ref 11.5–15.5)
WBC: 4.9 10*3/uL (ref 4.0–10.5)

## 2016-12-05 LAB — ABO/RH: ABO/RH(D): A NEG

## 2016-12-05 LAB — PROTIME-INR
INR: 1.07
Prothrombin Time: 13.8 seconds (ref 11.4–15.2)

## 2016-12-05 SURGERY — POSTERIOR LUMBAR FUSION 1 LEVEL
Anesthesia: General | Site: Spine Lumbar

## 2016-12-05 MED ORDER — MUPIROCIN 2 % EX OINT
1.0000 "application " | TOPICAL_OINTMENT | Freq: Two times a day (BID) | CUTANEOUS | Status: DC
  Administered 2016-12-05 – 2016-12-07 (×4): 1 via NASAL
  Filled 2016-12-05: qty 22

## 2016-12-05 MED ORDER — CHLORHEXIDINE GLUCONATE 4 % EX LIQD: 60.0000 mL | Freq: Once | CUTANEOUS | Status: DC

## 2016-12-05 MED ORDER — BUPIVACAINE HCL 0.5 % IJ SOLN
INTRAMUSCULAR | Status: DC | PRN
  Administered 2016-12-05: 12 mL

## 2016-12-05 MED ORDER — MEPERIDINE HCL 25 MG/ML IJ SOLN: 6.2500 mg | INTRAMUSCULAR | Status: DC | PRN

## 2016-12-05 MED ORDER — MORPHINE SULFATE (PF) 2 MG/ML IV SOLN: 1.0000 mg | INTRAVENOUS | Status: DC | PRN

## 2016-12-05 MED ORDER — SODIUM CHLORIDE 0.9% FLUSH: 3.0000 mL | INTRAVENOUS | Status: DC | PRN

## 2016-12-05 MED ORDER — BUPIVACAINE LIPOSOME 1.3 % IJ SUSP
20.0000 mL | INTRAMUSCULAR | Status: AC
  Administered 2016-12-05: 8 mL
  Filled 2016-12-05: qty 20

## 2016-12-05 MED ORDER — LIDOCAINE 2% (20 MG/ML) 5 ML SYRINGE
INTRAMUSCULAR | Status: AC
Start: 2016-12-05 — End: 2016-12-05
  Filled 2016-12-05: qty 5

## 2016-12-05 MED ORDER — EPHEDRINE 5 MG/ML INJ
INTRAVENOUS | Status: AC
  Filled 2016-12-05: qty 10

## 2016-12-05 MED ORDER — BUPIVACAINE HCL (PF) 0.5 % IJ SOLN
INTRAMUSCULAR | Status: AC
  Filled 2016-12-05: qty 30

## 2016-12-05 MED ORDER — HYDROMORPHONE HCL 1 MG/ML IJ SOLN
INTRAMUSCULAR | Status: AC
  Administered 2016-12-05: 0.5 mg via INTRAVENOUS
  Filled 2016-12-05: qty 1

## 2016-12-05 MED ORDER — ACETAMINOPHEN 650 MG RE SUPP: 650.0000 mg | RECTAL | Status: DC | PRN

## 2016-12-05 MED ORDER — ACETAMINOPHEN 325 MG PO TABS
650.0000 mg | ORAL_TABLET | ORAL | Status: DC | PRN
Start: 2016-12-05 — End: 2016-12-08
  Administered 2016-12-06 – 2016-12-08 (×8): 650 mg via ORAL
  Filled 2016-12-05 (×8): qty 2

## 2016-12-05 MED ORDER — HYDROMORPHONE HCL 1 MG/ML IJ SOLN
0.2500 mg | INTRAMUSCULAR | Status: DC | PRN
  Administered 2016-12-05 (×2): 0.5 mg via INTRAVENOUS

## 2016-12-05 MED ORDER — FLEET ENEMA 7-19 GM/118ML RE ENEM
1.0000 | ENEMA | Freq: Once | RECTAL | Status: AC | PRN
  Administered 2016-12-07: 1 via RECTAL
  Filled 2016-12-05: qty 1

## 2016-12-05 MED ORDER — MENTHOL 3 MG MT LOZG: 1.0000 | LOZENGE | OROMUCOSAL | Status: DC | PRN

## 2016-12-05 MED ORDER — POLYETHYLENE GLYCOL 3350 17 G PO PACK: 17.0000 g | PACK | Freq: Every day | ORAL | Status: DC | PRN

## 2016-12-05 MED ORDER — CEFAZOLIN SODIUM-DEXTROSE 2-4 GM/100ML-% IV SOLN
2.0000 g | INTRAVENOUS | Status: DC
  Filled 2016-12-05: qty 100

## 2016-12-05 MED ORDER — SODIUM CHLORIDE 0.9% FLUSH
3.0000 mL | Freq: Two times a day (BID) | INTRAVENOUS | Status: DC
  Administered 2016-12-05 – 2016-12-08 (×3): 3 mL via INTRAVENOUS

## 2016-12-05 MED ORDER — CEFAZOLIN SODIUM-DEXTROSE 2-4 GM/100ML-% IV SOLN
2.0000 g | Freq: Three times a day (TID) | INTRAVENOUS | Status: AC
  Administered 2016-12-05 – 2016-12-06 (×2): 2 g via INTRAVENOUS
  Filled 2016-12-05 (×3): qty 100

## 2016-12-05 MED ORDER — MIDAZOLAM HCL 2 MG/2ML IJ SOLN
INTRAMUSCULAR | Status: AC
  Filled 2016-12-05: qty 2

## 2016-12-05 MED ORDER — METHOCARBAMOL 500 MG PO TABS
500.0000 mg | ORAL_TABLET | Freq: Four times a day (QID) | ORAL | Status: DC | PRN
  Administered 2016-12-06 – 2016-12-08 (×6): 500 mg via ORAL
  Filled 2016-12-05 (×7): qty 1

## 2016-12-05 MED ORDER — THROMBIN 20000 UNITS EX SOLR
CUTANEOUS | Status: AC
  Filled 2016-12-05: qty 20000

## 2016-12-05 MED ORDER — ALUM & MAG HYDROXIDE-SIMETH 200-200-20 MG/5ML PO SUSP
30.0000 mL | Freq: Four times a day (QID) | ORAL | Status: DC | PRN
  Administered 2016-12-06: 30 mL via ORAL
  Filled 2016-12-05: qty 30

## 2016-12-05 MED ORDER — DOCUSATE SODIUM 100 MG PO CAPS
100.0000 mg | ORAL_CAPSULE | Freq: Two times a day (BID) | ORAL | Status: DC
  Administered 2016-12-05 – 2016-12-06 (×3): 100 mg via ORAL
  Filled 2016-12-05 (×3): qty 1

## 2016-12-05 MED ORDER — OXYCODONE HCL 5 MG PO TABS
5.0000 mg | ORAL_TABLET | ORAL | Status: DC | PRN
  Administered 2016-12-05: 10 mg via ORAL
  Administered 2016-12-06 – 2016-12-08 (×2): 5 mg via ORAL
  Filled 2016-12-05 (×2): qty 1
  Filled 2016-12-05: qty 2

## 2016-12-05 MED ORDER — OXYCODONE HCL ER 10 MG PO T12A
10.0000 mg | EXTENDED_RELEASE_TABLET | Freq: Two times a day (BID) | ORAL | Status: DC
  Administered 2016-12-05 – 2016-12-08 (×6): 10 mg via ORAL
  Filled 2016-12-05 (×6): qty 1

## 2016-12-05 MED ORDER — CLONAZEPAM 0.5 MG PO TABS: 0.5000 mg | ORAL_TABLET | Freq: Two times a day (BID) | ORAL | Status: DC | PRN

## 2016-12-05 MED ORDER — METHOCARBAMOL 1000 MG/10ML IJ SOLN
500.0000 mg | Freq: Four times a day (QID) | INTRAVENOUS | Status: DC | PRN
  Administered 2016-12-05: 500 mg via INTRAVENOUS
  Filled 2016-12-05 (×2): qty 5

## 2016-12-05 MED ORDER — PROPOFOL 10 MG/ML IV BOLUS
INTRAVENOUS | Status: AC
  Filled 2016-12-05: qty 20

## 2016-12-05 MED ORDER — PHENYLEPHRINE 40 MCG/ML (10ML) SYRINGE FOR IV PUSH (FOR BLOOD PRESSURE SUPPORT)
PREFILLED_SYRINGE | INTRAVENOUS | Status: AC
  Filled 2016-12-05: qty 10

## 2016-12-05 MED ORDER — 0.9 % SODIUM CHLORIDE (POUR BTL) OPTIME
TOPICAL | Status: DC | PRN
Start: 2016-12-05 — End: 2016-12-05
  Administered 2016-12-05: 1000 mL

## 2016-12-05 MED ORDER — PHENOL 1.4 % MT LIQD: 1.0000 | OROMUCOSAL | Status: DC | PRN

## 2016-12-05 MED ORDER — FLUTICASONE PROPIONATE 50 MCG/ACT NA SUSP
2.0000 | Freq: Every day | NASAL | Status: DC
  Administered 2016-12-05 – 2016-12-07 (×3): 2 via NASAL
  Filled 2016-12-05: qty 16

## 2016-12-05 MED ORDER — BISACODYL 5 MG PO TBEC: 5.0000 mg | DELAYED_RELEASE_TABLET | Freq: Every day | ORAL | Status: DC | PRN

## 2016-12-05 MED ORDER — FENTANYL CITRATE (PF) 250 MCG/5ML IJ SOLN
INTRAMUSCULAR | Status: AC
  Filled 2016-12-05: qty 5

## 2016-12-05 MED ORDER — SODIUM CHLORIDE 0.9 % IV SOLN
INTRAVENOUS | Status: DC
  Administered 2016-12-06: 03:00:00 via INTRAVENOUS

## 2016-12-05 MED ORDER — LATANOPROST 0.005 % OP SOLN
1.0000 [drp] | Freq: Every day | OPHTHALMIC | Status: DC
  Administered 2016-12-05 – 2016-12-07 (×3): 1 [drp] via OPHTHALMIC
  Filled 2016-12-05: qty 2.5

## 2016-12-05 MED ORDER — CEFAZOLIN SODIUM 1 G IJ SOLR
INTRAMUSCULAR | Status: AC
  Filled 2016-12-05: qty 20

## 2016-12-05 MED ORDER — ONDANSETRON HCL 4 MG PO TABS: 4.0000 mg | ORAL_TABLET | Freq: Four times a day (QID) | ORAL | Status: DC | PRN

## 2016-12-05 MED ORDER — ONDANSETRON HCL 4 MG/2ML IJ SOLN
4.0000 mg | Freq: Four times a day (QID) | INTRAMUSCULAR | Status: DC | PRN
  Administered 2016-12-07: 4 mg via INTRAVENOUS
  Filled 2016-12-05: qty 2

## 2016-12-05 MED ORDER — ONDANSETRON HCL 4 MG/2ML IJ SOLN: 4.0000 mg | Freq: Once | INTRAMUSCULAR | Status: DC | PRN

## 2016-12-05 MED ORDER — SODIUM CHLORIDE 0.9 % IV SOLN: 250.0000 mL | INTRAVENOUS | Status: DC

## 2016-12-05 MED ORDER — THROMBIN 20000 UNITS EX SOLR
CUTANEOUS | Status: DC | PRN
  Administered 2016-12-05: 10 mL via TOPICAL

## 2016-12-05 MED ORDER — PANTOPRAZOLE SODIUM 40 MG IV SOLR
40.0000 mg | Freq: Every day | INTRAVENOUS | Status: DC
  Administered 2016-12-05: 40 mg via INTRAVENOUS
  Filled 2016-12-05: qty 40

## 2016-12-05 MED ORDER — ARTIFICIAL TEARS OPHTHALMIC OINT
TOPICAL_OINTMENT | OPHTHALMIC | Status: AC
  Filled 2016-12-05: qty 3.5

## 2016-12-05 MED ORDER — HEMOSTATIC AGENTS (NO CHARGE) OPTIME
TOPICAL | Status: DC | PRN
  Administered 2016-12-05: 1 via TOPICAL

## 2016-12-05 MED ORDER — MINOXIDIL 2 % EX SOLN: CUTANEOUS | Status: DC

## 2016-12-05 MED ORDER — DARIFENACIN HYDROBROMIDE ER 7.5 MG PO TB24
15.0000 mg | ORAL_TABLET | Freq: Every day | ORAL | Status: DC
  Administered 2016-12-05 – 2016-12-08 (×4): 15 mg via ORAL
  Filled 2016-12-05 (×4): qty 2

## 2016-12-05 MED ORDER — ROCURONIUM BROMIDE 10 MG/ML (PF) SYRINGE
PREFILLED_SYRINGE | INTRAVENOUS | Status: AC
  Filled 2016-12-05: qty 5

## 2016-12-05 MED FILL — Heparin Sodium (Porcine) Inj 1000 Unit/ML: INTRAMUSCULAR | Qty: 30 | Status: AC

## 2016-12-05 MED FILL — Sodium Chloride IV Soln 0.9%: INTRAVENOUS | Qty: 2000 | Status: AC

## 2016-12-05 SURGICAL SUPPLY — 92 items
ADH SKN CLS APL DERMABOND .7 (GAUZE/BANDAGES/DRESSINGS) ×1
AGENT HMST KT MTR STRL THRMB (HEMOSTASIS) ×1
APL SKNCLS STERI-STRIP NONHPOA (GAUZE/BANDAGES/DRESSINGS) ×1
APL SRG 60D 8 XTD TIP BNDBL (TIP) ×1
BENZOIN TINCTURE PRP APPL 2/3 (GAUZE/BANDAGES/DRESSINGS) ×2 IMPLANT
BLADE CLIPPER SURG (BLADE) ×2 IMPLANT
BONE CANC CHIPS 20CC PCAN1/4 (Bone Implant) ×3 IMPLANT
BONE VIVIGEN FORMABLE 5.4CC (Bone Implant) ×3 IMPLANT
BUR MATCHSTICK NEURO 3.0 LAGG (BURR) ×3 IMPLANT
BUR RND FLUTED 2.5 (BURR) IMPLANT
BUR SABER RD CUTTING 3.0 (BURR) IMPLANT
BUR SABER RD CUTTING 3.0MM (BURR)
CAGE CONCORDE LIFT 11X21 (Cage) ×1 IMPLANT
CAGE CONCORDE LIFT 11X21MM (Cage) ×1 IMPLANT
CHIPS CANC BONE 20CC PCAN1/4 (Bone Implant) ×1 IMPLANT
CLOSURE STERI-STRIP 1/2X4 (GAUZE/BANDAGES/DRESSINGS) ×1
CLSR STERI-STRIP ANTIMIC 1/2X4 (GAUZE/BANDAGES/DRESSINGS) ×1 IMPLANT
COVER BACK TABLE 80X110 HD (DRAPES) ×3 IMPLANT
COVER MAYO STAND STRL (DRAPES) ×3 IMPLANT
COVER SURGICAL LIGHT HANDLE (MISCELLANEOUS) ×3 IMPLANT
DECANTER SPIKE VIAL GLASS SM (MISCELLANEOUS) ×3 IMPLANT
DERMABOND ADVANCED (GAUZE/BANDAGES/DRESSINGS) ×2
DERMABOND ADVANCED .7 DNX12 (GAUZE/BANDAGES/DRESSINGS) ×1 IMPLANT
DRAPE C-ARM 42X72 X-RAY (DRAPES) ×3 IMPLANT
DRAPE C-ARMOR (DRAPES) ×3 IMPLANT
DRAPE MICROSCOPE LEICA (MISCELLANEOUS) ×2 IMPLANT
DRAPE POUCH INSTRU U-SHP 10X18 (DRAPES) ×3 IMPLANT
DRAPE SURG 17X23 STRL (DRAPES) ×12 IMPLANT
DRSG MEPILEX BORDER 4X4 (GAUZE/BANDAGES/DRESSINGS) IMPLANT
DRSG MEPILEX BORDER 4X8 (GAUZE/BANDAGES/DRESSINGS) ×2 IMPLANT
DURAPREP 26ML APPLICATOR (WOUND CARE) ×3 IMPLANT
DURASEAL APPLICATOR TIP (TIP) ×2 IMPLANT
DURASEAL SPINE SEALANT 3ML (MISCELLANEOUS) ×2 IMPLANT
ELECT BLADE 6.5 EXT (BLADE) ×2 IMPLANT
ELECT CAUTERY BLADE 6.4 (BLADE) ×3 IMPLANT
ELECT REM PT RETURN 9FT ADLT (ELECTROSURGICAL) ×3
ELECTRODE REM PT RTRN 9FT ADLT (ELECTROSURGICAL) ×1 IMPLANT
EVACUATOR 1/8 PVC DRAIN (DRAIN) IMPLANT
GAUZE SPONGE 4X4 12PLY STRL (GAUZE/BANDAGES/DRESSINGS) ×3 IMPLANT
GLOVE BIO SURGEON STRL SZ7 (GLOVE) ×2 IMPLANT
GLOVE BIOGEL PI IND STRL 7.0 (GLOVE) IMPLANT
GLOVE BIOGEL PI IND STRL 7.5 (GLOVE) IMPLANT
GLOVE BIOGEL PI IND STRL 8 (GLOVE) ×1 IMPLANT
GLOVE BIOGEL PI INDICATOR 7.0 (GLOVE) ×4
GLOVE BIOGEL PI INDICATOR 7.5 (GLOVE) ×2
GLOVE BIOGEL PI INDICATOR 8 (GLOVE) ×4
GLOVE ECLIPSE 9.0 STRL (GLOVE) ×5 IMPLANT
GLOVE ORTHO TXT STRL SZ7.5 (GLOVE) ×7 IMPLANT
GLOVE SURG 8.5 LATEX PF (GLOVE) ×9 IMPLANT
GOWN STRL REUS W/ TWL LRG LVL3 (GOWN DISPOSABLE) ×1 IMPLANT
GOWN STRL REUS W/TWL 2XL LVL3 (GOWN DISPOSABLE) ×10 IMPLANT
GOWN STRL REUS W/TWL LRG LVL3 (GOWN DISPOSABLE) ×9
GRAFT BNE CANC CHIPS 1-8 20CC (Bone Implant) IMPLANT
GRAFT BNE MATRIX VG FRMBL MD 5 (Bone Implant) IMPLANT
KIT BASIN OR (CUSTOM PROCEDURE TRAY) ×3 IMPLANT
KIT POSITION SURG JACKSON T1 (MISCELLANEOUS) ×3 IMPLANT
KIT ROOM TURNOVER OR (KITS) ×3 IMPLANT
NDL ASP BONE MRW 11GX15 (NEEDLE) IMPLANT
NDL SPNL 18GX3.5 QUINCKE PK (NEEDLE) ×1 IMPLANT
NEEDLE 22X1 1/2 (OR ONLY) (NEEDLE) ×3 IMPLANT
NEEDLE ASP BONE MRW 11GX15 (NEEDLE) IMPLANT
NEEDLE BONE MARROW 8GAX6 (NEEDLE) IMPLANT
NEEDLE SPNL 18GX3.5 QUINCKE PK (NEEDLE) ×3 IMPLANT
NS IRRIG 1000ML POUR BTL (IV SOLUTION) ×3 IMPLANT
PACK LAMINECTOMY ORTHO (CUSTOM PROCEDURE TRAY) ×3 IMPLANT
PAD ARMBOARD 7.5X6 YLW CONV (MISCELLANEOUS) ×6 IMPLANT
PATTIES SURGICAL .75X.75 (GAUZE/BANDAGES/DRESSINGS) ×3 IMPLANT
PATTIES SURGICAL 1X1 (DISPOSABLE) ×1 IMPLANT
ROD PRE BENT EXP 40MM (Rod) ×4 IMPLANT
SCREW CORT FIX FEN 5.5X7X40MM (Screw) ×6 IMPLANT
SCREW SET SINGLE INNER (Screw) ×8 IMPLANT
SCREW VIPER 7X45MM (Screw) ×2 IMPLANT
SPONGE SURGIFOAM ABS GEL 100 (HEMOSTASIS) ×2 IMPLANT
SURGIFLO W/THROMBIN 8M KIT (HEMOSTASIS) ×2 IMPLANT
SUT NURALON 4 0 TR CR/8 (SUTURE) ×2 IMPLANT
SUT VIC AB 0 CT1 27 (SUTURE) ×3
SUT VIC AB 0 CT1 27XBRD ANBCTR (SUTURE) ×1 IMPLANT
SUT VIC AB 1 CTX 36 (SUTURE) ×6
SUT VIC AB 1 CTX36XBRD ANBCTR (SUTURE) ×2 IMPLANT
SUT VIC AB 2-0 CT1 27 (SUTURE) ×3
SUT VIC AB 2-0 CT1 TAPERPNT 27 (SUTURE) ×1 IMPLANT
SUT VIC AB 3-0 X1 27 (SUTURE) ×3 IMPLANT
SYR 20CC LL (SYRINGE) ×3 IMPLANT
SYR CONTROL 10ML LL (SYRINGE) ×6 IMPLANT
TAP CANN VIPER2 DL 5.0 (TAP) ×2 IMPLANT
TAP CANN VIPER2 DL 6.0 (TAP) ×2 IMPLANT
TAP CANN VIPER2 DL 7.0 (TAP) ×2 IMPLANT
TOWEL OR 17X24 6PK STRL BLUE (TOWEL DISPOSABLE) ×3 IMPLANT
TOWEL OR 17X26 10 PK STRL BLUE (TOWEL DISPOSABLE) ×3 IMPLANT
TRAY FOLEY W/METER SILVER 16FR (SET/KITS/TRAYS/PACK) ×3 IMPLANT
WATER STERILE IRR 1000ML POUR (IV SOLUTION) ×1 IMPLANT
YANKAUER SUCT BULB TIP NO VENT (SUCTIONS) ×3 IMPLANT

## 2016-12-05 NOTE — Transfer of Care (Signed)
Immediate Anesthesia Transfer of Care Note  Patient: Dwayne Acosta  Procedure(s) Performed: Right L4-5 Transforaminal lumbar interbody fusion with Depuy screws, rods and cages, local and allograft bone graft, Vivigen (N/A Spine Lumbar)  Patient Location: PACU  Anesthesia Type:General  Level of Consciousness: oriented, drowsy and patient cooperative  Airway & Oxygen Therapy: Patient Spontanous Breathing and Patient connected to face mask oxygen  Post-op Assessment: Report given to RN and Post -op Vital signs reviewed and stable  Post vital signs: Reviewed  Last Vitals:  Vitals:   12/05/16 0636 12/05/16 1515  BP: (!) 126/97   Pulse: (!) 50   Resp: 19   Temp: (!) 36.3 C (!) 36.2 C  SpO2: 99%     Last Pain:  Vitals:   12/05/16 1515  TempSrc:   PainSc: (P) Asleep         Complications: No apparent anesthesia complications

## 2016-12-05 NOTE — Interval H&P Note (Signed)
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 After consideration of risks, benefits and other options for treatment, the patient has consented to  Procedure(s): Right L4-5 Transforaminal lumbar interbody fusion with Depuy screws, rods and cages, local and allograft bone graft, Vivigen (N/A) as a surgical intervention .  The patient's history has been reviewed, patient examined, no change in status, stable for surgery.  I have reviewed the patient's chart and labs.  Questions were answered to the patient's satisfaction.     Basil Dess

## 2016-12-05 NOTE — Anesthesia Postprocedure Evaluation (Signed)
Anesthesia Post Note  Patient: Dwayne Acosta  Procedure(s) Performed: Right L4-5 Transforaminal lumbar interbody fusion with Depuy screws, rods and cages, local and allograft bone graft, Vivigen (N/A Spine Lumbar)     Anesthesia Post Evaluation  Last Vitals:  Vitals:   12/05/16 1530 12/05/16 1545  BP: 123/87 127/85  Pulse: 100 88  Resp: 16 15  Temp:    SpO2: 100% 100%    Last Pain:  Vitals:   12/05/16 1545  TempSrc:   PainSc: 4                  Wanetta Funderburke

## 2016-12-05 NOTE — H&P (Signed)
Dwayne Acosta is an 61 y.o. male.   Chief Complaint: back and leg pain HPI: patient with hx of L4-5 stenosis and above complaint presents for surgical intervention.  Failed conservative treatment.   Past Medical History:  Diagnosis Date  . Anxiety   . Complication of anesthesia    pain med caused bladder,stomach issue-unable to void"stomach swoll up"13 yrs ago note of surgery  . OAB (overactive bladder)   . Overactive bladder     Past Surgical History:  Procedure Laterality Date  . APPENDECTOMY    . BACK SURGERY  2005  . FOOT SURGERY Left    bunion  . LAPAROSCOPIC APPENDECTOMY N/A 07/20/2016   Procedure: APPENDECTOMY LAPAROSCOPIC;  Surgeon: Armandina Gemma, MD;  Location: [facility];  Service: General;  Laterality: N/A;  . ROTATOR CUFF REPAIR Bilateral     Family History  Problem Relation Age of Onset  . High blood pressure Mother   . Heart attack Father   . Parkinson's disease Father   . Stroke Paternal Grandmother   . Bone cancer Maternal Grandfather    Social History:  reports that he has never smoked. He has never used smokeless tobacco. He reports that he drinks alcohol. He reports that he does not use drugs.  Allergies: No Known Allergies  Facility-Administered Medications Prior to Admission  Medication Dose Route Frequency Provider Last Rate Last Dose  . polyethylene glycol (MIRALAX / GLYCOLAX) packet 17 g  17 g Oral Daily Jessy Oto, MD       Medications Prior to Admission  Medication Sig Dispense Refill  . clonazePAM (KLONOPIN) 0.5 MG tablet Take 1 tablet (0.5 mg total) by mouth 2 (two) times daily as needed for anxiety. 30 tablet 0  . fluticasone (FLONASE) 50 MCG/ACT nasal spray Place 2 sprays into both nostrils at bedtime.    Marland Kitchen latanoprost (XALATAN) 0.005 % ophthalmic solution Place 1 drop into both eyes at bedtime.     . lidocaine (LIDODERM) 5 % Place 1 patch onto the skin daily. Remove & Discard patch within 12 hours or as directed by MD 30 patch 0  .  methocarbamol (ROBAXIN) 750 MG tablet Take 1 tablet (750 mg total) by mouth every 8 (eight) hours as needed for muscle spasms. 20 tablet 0  . methylPREDNISolone (MEDROL DOSEPAK) 4 MG TBPK tablet Take as directed. 21 tablet 0  . minoxidil (ROGAINE) 2 % external solution Apply topically 1 day or 1 dose.    . mupirocin ointment (BACTROBAN) 2 % Place 1 application into the nose 2 (two) times daily.    Marland Kitchen OVER THE COUNTER MEDICATION Take by mouth 2 (two) times daily. Help hair- pill (pm)and a shake(am)    . traMADol (ULTRAM) 50 MG tablet Take 1 tablet (50 mg total) by mouth every 6 (six) hours as needed for moderate pain. 60 tablet 0  . VESICARE 10 MG tablet Take 10 mg by mouth at bedtime.     . sildenafil (REVATIO) 20 MG tablet TAKE 2-5 TABLETS BY MOUTH ONCE A DAY AS NEEDED FOR SEXUAL ACTIVITY  3    Results for orders placed or performed during the hospital encounter of 12/05/16 (from the past 48 hour(s))  APTT     Status: None   Collection Time: 12/05/16  6:40 AM  Result Value Ref Range   aPTT 29 24 - 36 seconds  CBC     Status: Abnormal   Collection Time: 12/05/16  6:40 AM  Result Value Ref Range   WBC 4.9  4.0 - 10.5 K/uL   RBC 4.49 4.22 - 5.81 MIL/uL   Hemoglobin 12.2 (L) 13.0 - 17.0 g/dL   HCT 37.4 (L) 39.0 - 52.0 %   MCV 83.3 78.0 - 100.0 fL   MCH 27.2 26.0 - 34.0 pg   MCHC 32.6 30.0 - 36.0 g/dL   RDW 13.3 11.5 - 15.5 %   Platelets 276 150 - 400 K/uL  Comprehensive metabolic panel     Status: Abnormal   Collection Time: 12/05/16  6:40 AM  Result Value Ref Range   Sodium 138 135 - 145 mmol/L   Potassium 3.8 3.5 - 5.1 mmol/L   Chloride 107 101 - 111 mmol/L   CO2 24 22 - 32 mmol/L   Glucose, Bld 91 65 - 99 mg/dL   BUN 15 6 - 20 mg/dL   Creatinine, Ser 1.03 0.61 - 1.24 mg/dL   Calcium 8.8 (L) 8.9 - 10.3 mg/dL   Total Protein 5.9 (L) 6.5 - 8.1 g/dL   Albumin 3.3 (L) 3.5 - 5.0 g/dL   AST 23 15 - 41 U/L   ALT 18 17 - 63 U/L   Alkaline Phosphatase 43 38 - 126 U/L   Total Bilirubin  0.5 0.3 - 1.2 mg/dL   GFR calc non Af Amer >60 >60 mL/min   GFR calc Af Amer >60 >60 mL/min    Comment: (NOTE) The eGFR has been calculated using the CKD EPI equation. This calculation has not been validated in all clinical situations. eGFR's persistently <60 mL/min signify possible Chronic Kidney Disease.    Anion gap 7 5 - 15  Protime-INR     Status: None   Collection Time: 12/05/16  6:40 AM  Result Value Ref Range   Prothrombin Time 13.8 11.4 - 15.2 seconds   INR 1.07    No results found.  Review of Systems  Constitutional: Negative.   HENT: Negative.   Eyes: Negative.   Respiratory: Negative.   Cardiovascular: Negative.   Gastrointestinal: Negative.   Genitourinary: Negative.   Musculoskeletal: Negative.   Skin: Negative.   Neurological: Positive for tingling.  Psychiatric/Behavioral: Negative.     Blood pressure (!) 126/97, pulse (!) 50, temperature (!) 97.4 F (36.3 C), temperature source Oral, resp. rate 19, height _0  (1.753 m), weight 180 lb (81.6 kg), SpO2 99 %. Physical Exam  Constitutional: He is oriented to person, place, and time. He appears well-developed. No distress.  HENT:  Head: Normocephalic.  Eyes: Pupils are equal, round, and reactive to light.  Neck: Normal range of motion.  Respiratory: No respiratory distress.  GI: He exhibits no distension.  Musculoskeletal: He exhibits tenderness.  Neurological: He is alert and oriented to person, place, and time.  Skin: Skin is warm and dry.  Psychiatric: He has a normal mood and affect.     Assessment/Plan L4-5 stenosis   We will proceed with Right L4-5 Transforaminal lumbar interbody fusion with Depuy screws, rods and cages, local and allograft bone graft, Vivigen as scheduled.  Risks and complications discussed.  Wishes to proceed.   Benjiman Core, PA-C 12/05/2016, 7:31 AM

## 2016-12-05 NOTE — Op Note (Addendum)
12/05/2016  3:36 PM  PATIENT:  Dwayne Acosta  61 y.o. male  MRN: [id number]  OPERATIVE REPORT  PRE-OPERATIVE DIAGNOSIS:  L4-5 spondylolisthesis with right L4 foraminal stenosis above L5-S1 fusion  POST-OPERATIVE DIAGNOSIS:  L4-5 spondylolisthesis with right L4 foraminal stenosis above L5-S1 fusion  PROCEDURE:  Procedure(s): Right L4-5 Transforaminal lumbar interbody fusion with Depuy screws, rods and cages, local and allograft bone graft, Vivigen    SURGEON:  Jessy Oto, MD     ASSISTANT:James Ricard Dillon, PA-C  (Present throughout the entire procedure and necessary for completion of procedure in a timely manner)     ANESTHESIA:  General,supplemented with local marcaine 0.5% 1.1 exparel 1.3% total 20cc, Dr. Lillia Abed.    COMPLICATIONS:  Dural CSF leak right and left L5-S1 820m on the left closed with 2 4-0 neurolon sutures. Right dural tear was with very Thin friable borders and was repair with interrupted 4-0 neurolon and the the surrounding scar and soft tissue oversewn over the right side reinforcing the repair with a running 4.0 neurolon suture and interrupted 4-0 neurolon. There were two small openings 266mand 20m46mnd an area over the right medial L5 pedicle that was repaired with a layer of oversewn softtisse as there was no definite tear.     COMPONENTS:   Implant Name Type Inv. Item Serial No. Manufacturer Lot No. LRB No. Used  CAGE CONCORDE LIFT 11X21MM - [id number] CAGE CONCORDE LIFT 11X21MM  [employer] [id number] N/A 1  BONE VIVIGEN FORMABLE 5.4CC - [id number] Implant BONE VIVIGEN FORMABLE 5.4CC [id number] [place] [facility]  N/A 1  BONE CANC CHIPS 20CC - [id number] Implant BONE CANC CHIPS 20CC [id number] [place] [facility]  N/A 1  SCREW VIPER 7X45MM - [id number] SCREW VIPER 7X45MM  [employer]  N/A 1  SCREW CORT FIX FEN 5.5X7X40MM - [id number] SCREW CORT FIX FEN 5.5X7X40MM  [employer]  SPINE  N/A 3  SCREW SET SINGLE INNER - [id number] SCREW SET SINGLE INNER  [employer]  N/A 4  ROD PRE BENT EXP 40MM - [id number] ROD PRE BENT EXP 40MM   [employer]   N/A 2    PROCEDURE:The patient was met in the holding area, and the appropriate lumbar level right L4-5 identified and marked with an x and my initials.The patient was then transported to OR. The patient was then placed under general anesthesia without difficulty.The patient received appropriate preoperative antibiotic prophylaxis ancef. Nursing staff inserted a Foley catheter under sterile conditions. He was then turned to a prone position [place] table was used for this case. All pressure points were well padded PAS stocking applied bilateral lower extremity to prevent DVT. Standard prep DuraPrep solution. Draped in the usual manner. Time-out procedure was called and correct .  The old incision scar was ellipsed L3 to S1 and carried inferiorly an additional one level to the S1 spinous process.  Bovie electric cautery was used to control bleeding and carefully dissection was carried down along the lateral aspects of the spinous process of L3 to S2. Cobb then used to carefully elevate the paralumbar muscle and the incision in the midline was carried to to the level of the base of the residual spinous processes. While exposing along the left of the midline a dural open 2mm50m length occurred. The laminotomy area extending from the base of the spinous process of L3  to the superior aspect  of L5 was carefully exposed and its edges debrided the scar tissue using a large curette. The bilateral pedicle screw and rod construct at the L5-S1 level was also exposed.  Cobb elevator used to carefully elevate the paralumbar muscles off of the posterior elements using electrocautery carefully drilled bleeding and perform dissection of the muscle tissues of the preserving the facet capsule at the L3-4.  Incision was  carried in the midline down to the S1 level area bleeders controlled using electrocautery monopolar electrocautery.   C-arm fluoroscopy was then brought into the field and using C-arm fluoroscopy then a hole made into the medial aspect of the pedicle of L4 observed in the pedicle using C arm at the 5 oclock position on the right L4 pedicle nerve probe initial entry was determined on fluoroscopy to be good position alignment so that a 4.66m tap was passed to 40 mm within the right L4 pedicle to a depth of nearly 40 mm observed on C-arm fluoroscopy to be beyond the midpoint of the lumbar vertebra and then position alignment within the right L4 pedicle this was then removed and the pedicle channel probed demonstrating patency no sign of rupture the cortex of the pedicle. Tapping with a 4 mm screw tap then 5 mm tap, then 6.0 mm tap and the a 7.0 mm tap then 7.0 mm x 40 mm screw was placed on the OR table for placement after the TLIF portion of the case for placement on the right side at the L4 level. C-arm fluoroscopy was then brought into the field and using C-arm fluoroscopy then a hole made into the posterior medial aspect of the pedicle of left L4 observed in the pedicle using ball tipped nerve hook and hockey stick nerve probe initial entry was determined on fluoroscopy to be good position alignment so that 4.068mtap was then used to tap the left L4 pedicle to a depth of nearly 45 mm observed on C-arm fluoroscopy to be beyond the midpoint of the lumbar vertebra and then position alignment within the left L4 pedicle this was then removed and the pedicle channel probed demonstrating patency no sign of rupture the cortex of the pedicle. Tapping with a 5 mm screw tap then a 6.0 mm tap and then a 7.0 mm tap, a 7.0 mm x 45 mm screw was placed into the left L4 pedicle. The C-arm then remove from the field and the L5-S1 pedicle screw and rods were exposed. Bone had overgrown much of the L5  Pedicle screw fasteners and  osteotomes and burr used to remove bone exposing the hardware bilaterally. The instrumentation was the Monarch pedicle screws and rod and was incompatible with the expedium mPACT pedicle screw and rods. The screw caps were then removed and the caps twist unlocked on the posterior aspect of the fasteners after careful debridement of soft tissue about the fasteners. The rods the lifted with a Cobb elevator inferiorly. The screw removing handle then used to engage the Monarch pedicle screw heads and the pedicle screws each individually removed. Each screw carefully measured for width and depth. Inferior spinous processes of  L4 was then resected down to the base the lamina at each segment the lower 50% of the spinous process of L3 was resected and Leksell rongeur used to resect inferior aspect of the lamina on the left side at the L4 level and partially on the right side at L4 The left medial 40% of the facet of L4-5 and L5-S1 were resected in  order to decompress the left side of the lumbar thecal sac at  L4-5 and decompress the right L4 and L5 neuroforamen. Osteotomes and 46m and 362mkerrisons were used for this portion of the decompression. Similarly the right side decompression was carried out but complete facetectomies were perform on the right at L4-5  to provide for exposure of the right side L4-5 and the L4 neuroforamen for ease of placement of TLIF (transforaminal lumbar interbody fusion) at each level inferior portions of the lamina and pars were also resected first beginning with the Leksell rongeur and osteotomes and then resecting using 2 and 3 mm Kerrison. Continued laminectomy was carried out resecting the inferior central portions of the lamina of L4 performing foraminotomies on the right side at the L4-L5 level. The inferior articular process L4 was resected on the right side. The L5 nerve root identified on the right side at the medial aspect of the L5 pedicle. Superior articular process of L5 was  then resected from the right side further decompressing the right L5 nerve and providing for exposure of the area just superior to the L5 pedicle for a placement of cage. A large amount of hypertrophic ligmentum flavum was found impressing on the right lateral recesses at L4-5 narrowing the respective L4 neuroforamen. During the resection of the superior articular process of right L5 the medial protion of the process medial to the right L5 pedicle was loose and was place under traction and the thecal sac medially dissected free of the bone fragment with a Penfield #4. OR microscope magnification was used during this portion procedure.  Attention then turned to placement of the transforaminal lumbar interbody fusion cages. Using a Penfield 4 the right lateral aspect of the thecal sac at the L4-5 disc space was carefully freed up The thecal sac could then easily be retracted in the posterior lateral aspect of the L4-5 disc was exposed 15 blade scalpel used to incise the posterolateral disc and an osteotome used to resect a small portion of bone off the superior aspect of the posterior superior vertebral body of L5 in order to ease the entry into the L4-5 disc space. A  18m15merrison rongeur was then able to be introduced in the disc space debrided it was narrow posteriorly. 8 mm dilator shaver was used to dialate the L4-5 disc space on the right side dilation further to 11 mm was successful and using curettes the disc space was debrided of a large amount of degenerative disc present in the endplates debrided to bleeding endplate bone. Shavers were inserted to trial the intervertebral disc space. A 8mm74m 13 mm x 28mm618muy Lift cage was carefully packed with morcellized bone graft that been harvested from previous laminotomies and vivigen 2.The 8mm-169m LIFT cage was then inserted with the insertion handle.  Additional bone graft was then packed into the cage while in the intervertebral disc space through the  cannulated insertion sleeve. Bleeding controlled using bipolar electrocautery thrombin soaked gel cottonoids. Observed on C-arm fluoroscopy to be in good position alignment. The cage at L4-5 was placed anteriorly as best as possible to maintaine the patient's lordosis that was present. With this then the transforaminal lumbar interbody fusion portion of the case was completed bleeders were controlled using bipolar electrocautery thrombin-soaked Gelfoam were appropriate.Decortication of the facet joints carried out left L4-5. These were packed with cancellous local bone graft and vivigen 2. 2 pedicle screws on the left already in place, the 2 mPACT corticofixation screws on  the right were each placed and then each fastener carefully aligned  to allow for placement of rods. The right side first quarter inch titanium rod was a precontoured 40 mm rod. This was then placed into the pedicle screws on the right extending from L4-L5 each of the caps were carefully placed loosely tightened. Attention turned to the left side were similarly and then screws were carefully adjusted to allow for a better pattern screws to allow for placement of fixation of the rod a quarter inch 40 mm precontoured titanium rod was then carefully contoured. This was able to be inserted into the left pedicle screw fasteners, Caps onto the bilateral L4 fasteners were tightened to 80 foot lbs. Across the right side  L4-5 screw fasteners compression was obtained on the right side between L4 and L5 by compressing between the fasteners and tightening the screw caps 85 pounds. Similarly this was done on the left side at L4-5 obtaining compression and tightened 85 pounds. Irrigation was carried out with copious amounts of saline solution this was done throughout the case. Cell Saver was used during the case.Hockey stick neuroprobe was used to probe the neuroforamen bilateral L4 and L5 , these were determined to be well decompressed. Permanent C-arm images  were obtained in AP and lateral plane and oblique planes. Remaining local bone graft was then applied along both lateral posterior lateral region extending from L4 toS1 facet beds.Gelfoam was then removed spinal canal the lumbodorsal musculature carefully exam debrided of any devitalized tissue following removal of Viper retractors were the bleeders were controlled using electrocautery and the area dorsal lumbar muscle were then approximated in the midline with interrupted #1 Vicryl sutures loose the dorsal fascia was reattached to the spinous process of L3 to superiorly and S1 inferiorly this was done with #1 Vicryl sutures. Subcutaneous layers then approximated using interrupted 0 Vicryl sutures and 2-0 Vicryl sutures. Skin was closed with a running subcutaneous stitch of 4-0 Vicryl Dermabond was applied then MedPlex bandage. All instrument and sponge counts were correct. The patient was then returned to a supine position on her bed reactivated extubated and returned to the recovery room in satisfactory condition.   The C-arm was used to verify the position alignment of pedicle screws and rods.  Thrombin-soaked Gelfoam were placed for hemostasis was then carefully removed. Permanent C-arm images were obtained in AP oblique and lateral positions for documentation purposes. They showed the pedicle screws rods to be in good position alignment from L4-L5. Dural CSF leak right and left L5-S1 73m on the left closed with 2 4-0 neurolon sutures. Right dural tear was with very Thin friable borders and was repair with interrupted 4-0 neurolon and the the surrounding scar and soft tissue oversewn over the right side reinforcing the repair with a running 4.0 neurolon suture and interrupted 4-0 neurolon. There were two small openings 229mand 20m57mnd an area over the right medial L5 pedicle that was repaired with a layer of oversewn softtisse as there was no definite tear. After copious amounts of irrigation the the patient  valsalved to 30-40 mm HG demonstrating no CSF leak. A layer of dura seal then place over the right L5 and left central L5   Adequate hemostasis then with Gelfoam thrombin soaked cottonoids these were then removed when hemostasis had been obtained. Copious amount of irrigation was then carried out and devitalized tissue debrided. Cell Saver was used during this case however there was 600 cc blood loss and a total of  200 cc of Cell Saver blood was returned.The deep paralumbar muscles with her approximated with 1 Vicryl sutures, the lumbodorsal fascia approximated with 0 and 1 Vicryl sutures. Subcutaneous layers were then approximated with interrupted 0 and 2-0 Vicryl sutures , skin closed with a running subcuticular 4-0 Vicryl suture. The bone was then applied. Mepilex bandage was then applied. The exiting Hemovac drain site and carefully bandage. All instrument and sponge counts were correct. Patient was then returned to the supine position reactivated extubated and returned to the recovery room in satisfactory condition.  Benjiman Core, PA-C perform the duties of assistant surgeon during this case. He was present from the beginning of the case to the end of the case assisting in transfer the patient from his stretcher to the OR table and back to the stretcher at the end of the case. Assisted in careful retraction and suction of the laminectomy site delicate neural structures operating under the operating room microscope. He performed closure of the incision from the fascia to the skin applying the dressing.      Basil Dess  12/05/2016, 3:36 PM

## 2016-12-05 NOTE — Anesthesia Postprocedure Evaluation (Signed)
Anesthesia Post Note  Patient: Dwayne Acosta  Procedure(s) Performed: Right L4-5 Transforaminal lumbar interbody fusion with Depuy screws, rods and cages, local and allograft bone graft, Vivigen (N/A Spine Lumbar)     Patient location during evaluation: PACU Anesthesia Type: General Level of consciousness: awake and alert Pain management: pain level controlled Vital Signs Assessment: post-procedure vital signs reviewed and stable Respiratory status: spontaneous breathing, nonlabored ventilation, respiratory function stable and patient connected to nasal cannula oxygen Cardiovascular status: blood pressure returned to baseline and stable Postop Assessment: no apparent nausea or vomiting Anesthetic complications: no    Last Vitals:  Vitals:   12/05/16 1530 12/05/16 1545  BP: 123/87 127/85  Pulse: 100 88  Resp: 16 15  Temp:    SpO2: 100% 100%    Last Pain:  Vitals:   12/05/16 1545  TempSrc:   PainSc: 4                  [name]

## 2016-12-05 NOTE — Anesthesia Preprocedure Evaluation (Addendum)
Anesthesia Evaluation  Patient identified by MRN, date of birth, ID band Patient awake    Reviewed: Allergy & Precautions, NPO status , Patient's Chart, lab work & pertinent test results  Airway Mallampati: I  TM Distance: >3 FB Neck ROM: Full    Dental  (+) Teeth Intact, Dental Advisory Given   Pulmonary    Pulmonary exam normal breath sounds clear to auscultation       Cardiovascular Normal cardiovascular exam Rhythm:Regular Rate:Normal     Neuro/Psych PSYCHIATRIC DISORDERS Anxiety    GI/Hepatic   Endo/Other    Renal/GU      Musculoskeletal   Abdominal   Peds  Hematology   Anesthesia Other Findings   Reproductive/Obstetrics                            Anesthesia Physical Anesthesia Plan  ASA: II  Anesthesia Plan: General   Post-op Pain Management:    Induction: Intravenous  PONV Risk Score and Plan: 2 and Ondansetron and Dexamethasone  Airway Management Planned: Oral ETT  Additional Equipment:   Intra-op Plan:   Post-operative Plan: Extubation in OR  Informed Consent: I have reviewed the patients History and Physical, chart, labs and discussed the procedure including the risks, benefits and alternatives for the proposed anesthesia with the patient or authorized representative who has indicated his/her understanding and acceptance.     Plan Discussed with: CRNA and Surgeon  Anesthesia Plan Comments:         Anesthesia Quick Evaluation

## 2016-12-05 NOTE — Addendum Note (Signed)
Addendum  created 12/05/16 1626 by Janeece Riggers, MD   Pend clinical note

## 2016-12-05 NOTE — H&P (Signed)
PREOPERATIVE H&P  Chief Complaint: L4-5 spondylolisthesis with right L4 foraminal stenosis above L5-S1 fusion  HPI: Dwayne Acosta is a 61 y.o. male who presents for preoperative history and physical with a diagnosis of L4-5 spondylolisthesis with right L4 foraminal stenosis above L5-S1 fusion. Symptoms are rated as moderate to severe, and have been worsening.  This is significantly impairing activities of daily living.  He has elected for surgical management.   Past Medical History:  Diagnosis Date  . Anxiety   . Complication of anesthesia    pain med caused bladder,stomach issue-unable to void"stomach swoll up"13 yrs ago note of surgery  . OAB (overactive bladder)   . Overactive bladder    Past Surgical History:  Procedure Laterality Date  . APPENDECTOMY    . BACK SURGERY  2005  . FOOT SURGERY Left    bunion  . LAPAROSCOPIC APPENDECTOMY N/A 07/20/2016   Procedure: APPENDECTOMY LAPAROSCOPIC;  Surgeon: Armandina Gemma, MD;  Location: [facility];  Service: General;  Laterality: N/A;  . ROTATOR CUFF REPAIR Bilateral    Social History   Social History  . Marital status: Married    Spouse name: Santiago Glad  . Number of children: 1  . Years of education: College   Occupational History  .  [employer]   Social History Main Topics  . Smoking status: Never Smoker  . Smokeless tobacco: Never Used  . Alcohol use 0.0 oz/week     Comment: occ-gin or vodka 1-2 week  . Drug use: No  . Sexual activity: Not Asked   Other Topics Concern  . None   Social History Narrative   Patient lives at home with his family.   Caffeine Use: very little   Family History  Problem Relation Age of Onset  . High blood pressure Mother   . Heart attack Father   . Parkinson's disease Father   . Stroke Paternal Grandmother   . Bone cancer Maternal Grandfather    No Known Allergies Prior to Admission medications   Medication Sig Start Date End Date Taking? Authorizing Provider  clonazePAM (KLONOPIN) 0.5 MG  tablet Take 1 tablet (0.5 mg total) by mouth 2 (two) times daily as needed for anxiety. 11/21/16  Yes Jessy Oto, MD  fluticasone (FLONASE) 50 MCG/ACT nasal spray Place 2 sprays into both nostrils at bedtime.   Yes [provider]  latanoprost (XALATAN) 0.005 % ophthalmic solution Place 1 drop into both eyes at bedtime.  10/13/16  Yes [provider]  lidocaine (LIDODERM) 5 % Place 1 patch onto the skin daily. Remove & Discard patch within 12 hours or as directed by MD 11/19/16  Yes Ward, Ozella Almond, PA-C  methocarbamol (ROBAXIN) 750 MG tablet Take 1 tablet (750 mg total) by mouth every 8 (eight) hours as needed for muscle spasms. 11/19/16  Yes Ward, Ozella Almond, PA-C  methylPREDNISolone (MEDROL DOSEPAK) 4 MG TBPK tablet Take as directed. 11/21/16  Yes Jessy Oto, MD  minoxidil (ROGAINE) 2 % external solution Apply topically 1 day or 1 dose.   Yes [provider]  mupirocin ointment (BACTROBAN) 2 % Place 1 application into the nose 2 (two) times daily.   Yes [provider]  OVER THE COUNTER MEDICATION Take by mouth 2 (two) times daily. Help hair- pill (pm)and a shake(am)   Yes [provider]  traMADol (ULTRAM) 50 MG tablet Take 1 tablet (50 mg total) by mouth every 6 (six) hours as needed for moderate pain. 09/27/16  Yes Basil Dess  E, MD  VESICARE 10 MG tablet Take 10 mg by mouth at bedtime.  10/13/16  Yes [provider]  sildenafil (REVATIO) 20 MG tablet TAKE 2-5 TABLETS BY MOUTH ONCE A DAY AS NEEDED FOR SEXUAL ACTIVITY 09/14/16   [provider]     Positive ROS: All other systems have been reviewed and were otherwise negative with the exception of those mentioned in the HPI and as above.  Physical Exam: General: Alert, no acute distress, atraumatic. Cardiovascular: No pedal edema Respiratory: No cyanosis, no use of accessory musculature GI: No organomegaly, abdomen is soft and non-tender Skin: No lesions in the area of  chief complaint Neurologic: Sensation intact distally, weak right quadriceps and right foot DF 4/5 Psychiatric: Patient is competent for consent with normal mood and affect Lymphatic: No axillary or cervical lymphadenopathy  MUSCULOSKELETAL: AP in is predominately right anterior thigh and anterior shin.weakness right foot DF 4/5 and right knee extension 4/5 SLR negative. Reverse SLR is positive with replication of pain  Assessment: L4-5 spondylolisthesis with right L4 foraminal stenosis above L5-S1 fusion  Plan: Plan for Procedure(s): Right L4-5 Transforaminal lumbar interbody fusion with Depuy screws, rods and cages, local and allograft bone graft, Vivigen  The risks benefits and alternatives were discussed with the patient including but not limited to the risks of nonoperative treatment, versus surgical intervention including infection, bleeding, nerve injury,  blood clots, cardiopulmonary complications, morbidity, mortality, among others, and they were willing to proceed.   Basil Dess, MD Cell [phone] Office [phone] 12/05/2016 7:24 AM

## 2016-12-05 NOTE — Brief Op Note (Signed)
12/05/2016  2:36 PM  PATIENT:  Dwayne Acosta  61 y.o. male  PRE-OPERATIVE DIAGNOSIS:  L4-5 spondylolisthesis with right L4 foraminal stenosis above L5-S1 fusion  POST-OPERATIVE DIAGNOSIS:  L4-5 spondylolisthesis with right L4 foraminal stenosis above L5-S1 fusion  PROCEDURE:  Procedure(s): Right L4-5 Transforaminal lumbar interbody fusion with Depuy screws, rods and cages, local and allograft bone graft, Vivigen (N/A)  SURGEON:  Surgeon(s) and Role:    * Jessy Oto, MD - Primary  PHYSICIAN ASSISTANT:Sajan Cheatwood Ricard Dillon, PA-C   ANESTHESIA:   local and general, Dr. Conrad Merriam Woods.  EBL:  No intake/output data recorded.220 CC  COMPLICATION: Dural tear Midline left and right L5-S1.No neural element involvement.  BLOOD ADMINISTERED:200CC CC CELLSAVER  DRAINS: Urinary Catheter (Foley)   LOCAL MEDICATIONS USED:  MARCAINE 0.5% 1:1 EXPAREL 1.3% Amount: 20 ml  SPECIMEN:  No Specimen  DISPOSITION OF SPECIMEN:  N/A  COUNTS:  YES  TOURNIQUET:  * No tourniquets in log *  DICTATION: .Dragon Dictation  PLAN OF CARE: Admit to inpatient   PATIENT DISPOSITION:  PACU - hemodynamically stable.   Delay start of Pharmacological VTE agent (>24hrs) due to surgical blood loss or risk of bleeding: yes

## 2016-12-05 NOTE — Anesthesia Procedure Notes (Signed)
Procedure Name: Intubation Date/Time: 12/05/2016 7:52 AM Performed by: Jenne Campus Pre-anesthesia Checklist: Patient identified, Emergency Drugs available, Suction available and Patient being monitored Patient Re-evaluated:Patient Re-evaluated prior to induction Oxygen Delivery Method: Circle System Utilized Preoxygenation: Pre-oxygenation with 100% oxygen Induction Type: IV induction Ventilation: Mask ventilation without difficulty Laryngoscope Size: Miller and 2 Grade View: Grade I Tube type: Oral Tube size: 7.5 mm Number of attempts: 1 Airway Equipment and Method: Stylet and Oral airway Placement Confirmation: ETT inserted through vocal cords under direct vision,  positive ETCO2 and breath sounds checked- equal and bilateral Secured at: 21 cm Tube secured with: Tape Dental Injury: Teeth and Oropharynx as per pre-operative assessment

## 2016-12-06 ENCOUNTER — Encounter (HOSPITAL_COMMUNITY): Payer: Self-pay | Admitting: General Practice

## 2016-12-06 LAB — CBC
HEMATOCRIT: 34.3 % — AB (ref 39.0–52.0)
HEMOGLOBIN: 11.6 g/dL — AB (ref 13.0–17.0)
MCH: 28.2 pg (ref 26.0–34.0)
MCHC: 33.8 g/dL (ref 30.0–36.0)
MCV: 83.5 fL (ref 78.0–100.0)
PLATELETS: 247 10*3/uL (ref 150–400)
RBC: 4.11 MIL/uL — ABNORMAL LOW (ref 4.22–5.81)
RDW: 13.6 % (ref 11.5–15.5)
WBC: 13 10*3/uL — ABNORMAL HIGH (ref 4.0–10.5)

## 2016-12-06 LAB — BASIC METABOLIC PANEL
ANION GAP: 10 (ref 5–15)
BUN: 10 mg/dL (ref 6–20)
CALCIUM: 8.5 mg/dL — AB (ref 8.9–10.3)
CO2: 25 mmol/L (ref 22–32)
Chloride: 98 mmol/L — ABNORMAL LOW (ref 101–111)
Creatinine, Ser: 1.15 mg/dL (ref 0.61–1.24)
GFR calc Af Amer: >60 mL/min (ref >60)
GLUCOSE: 123 mg/dL — AB (ref 65–99)
Potassium: 4.2 mmol/L (ref 3.5–5.1)
SODIUM: 133 mmol/L — AB (ref 135–145)

## 2016-12-06 MED ORDER — SODIUM CHLORIDE 0.9 % IV SOLN
INTRAVENOUS | Status: DC
  Administered 2016-12-06 – 2016-12-07 (×2): via INTRAVENOUS

## 2016-12-06 MED ORDER — SIMETHICONE 40 MG/0.6ML PO SUSP
40.0000 mg | Freq: Four times a day (QID) | ORAL | Status: DC | PRN
  Filled 2016-12-06: qty 0.6

## 2016-12-06 MED ORDER — PANTOPRAZOLE SODIUM 40 MG PO TBEC
40.0000 mg | DELAYED_RELEASE_TABLET | Freq: Every day | ORAL | Status: DC
  Administered 2016-12-06 – 2016-12-07 (×2): 40 mg via ORAL
  Filled 2016-12-06 (×2): qty 1

## 2016-12-06 NOTE — Progress Notes (Signed)
     Subjective: 1 Day Post-Op Procedure(s) (LRB): Right L4-5 Transforaminal lumbar interbody fusion with Depuy screws, rods and cages, local and allograft bone graft, Vivigen (N/A) Awake,alert and oriented x 4. Foley catheter discontinued and able to void into the condom cath. Abdomen is distended and he is uncomfortable. Abdomen is tympanic. Had a NGT tube last fusion years ago. No HAs tonight some this AM. Does drink coffee. Patient reports pain as moderate.    Objective:   VITALS:  Temp:  [98.3 F (36.8 C)-100.4 F (38 C)] 98.3 F (36.8 C) (10/03 1717) Pulse Rate:  [73-92] 92 (10/03 1717) Resp:  [16-20] 20 (10/03 1717) BP: (114-124)/(71-82) 124/74 (10/03 1717) SpO2:  [97 %-100 %] 98 % (10/03 1717)  Neurologically intact ABD soft Neurovascular intact Sensation intact distally Intact pulses distally Dorsiflexion/Plantar flexion intact Incision: scant drainage   LABS  Recent Labs  12/05/16 0640 12/06/16 0305  HGB 12.2* 11.6*  WBC 4.9 13.0*  PLT 276 247    Recent Labs  12/05/16 0640 12/06/16 0305  NA 138 133*  K 3.8 4.2  CL 107 98*  CO2 24 25  BUN 15 10  CREATININE 1.03 1.15  GLUCOSE 91 123*    Recent Labs  12/05/16 0640  INR 1.07     Assessment/Plan: 1 Day Post-Op Procedure(s) (LRB): Right L4-5 Transforaminal lumbar interbody fusion with Depuy screws, rods and cages, local and allograft bone graft, Vivigen (N/A)  Change to IVF as he likely has an ileus, give simethicone. Up with therapy in the AM if no nuchal signs.   Basil Dess 12/06/2016, 7:02 PM Patient ID: Dwayne Acosta, male   DOB: 1955/07/23, 61 y.o.   MRN: [id number]

## 2016-12-06 NOTE — Progress Notes (Signed)
Patient temperature 100.4, WBC this AM was 13.0 Dr Louanne Skye (MD) notified who stated that patient should continue the use of incentive spirometer and give prn tylenol when needed.

## 2016-12-06 NOTE — Progress Notes (Signed)
Orthopedic Tech Progress Note Patient Details:  Channing Yeager 1956-02-24 [id number]  Patient ID: Marry Guan, male   DOB: Aug 02, 1955, 61 y.o.   MRN: [id number]   Hildred Priest 12/06/2016, 9:39 AM Called in bio-tech brace order; spoke with [facility]

## 2016-12-06 NOTE — Care Management Note (Signed)
Case Management Note  Patient Details  Name: Dwayne Acosta MRN: [id number] Date of Birth: 08-29-1955  Subjective/Objective:     Pt s/p lumbar fusion. She is from home with her spouse.                Action/Plan: Pt currently on bedrest. MD has placed orders for [facility] and DME. CM will continue to follow for patient d/c needs.   Expected Discharge Date:                  Expected Discharge Plan:  [place]  In-House Referral:     Discharge planning Services  CM Consult  Post Acute Care Choice:  Home Health, Durable Medical Equipment Choice offered to:     DME Arranged:    DME Agency:     HH Arranged:    HH Agency:     Status of Service:  In process, will continue to follow  If discussed at Long Length of Stay Meetings, dates discussed:    Additional Comments:  Pollie Friar, RN 12/06/2016, 11:06 AM

## 2016-12-07 LAB — CBC WITH DIFFERENTIAL/PLATELET
BASOS PCT: 0 %
Basophils Absolute: 0 10*3/uL (ref 0.0–0.1)
Eosinophils Absolute: 0.1 10*3/uL (ref 0.0–0.7)
Eosinophils Relative: 1 %
HEMATOCRIT: 32.7 % — AB (ref 39.0–52.0)
HEMOGLOBIN: 10.6 g/dL — AB (ref 13.0–17.0)
LYMPHS ABS: 1.5 10*3/uL (ref 0.7–4.0)
Lymphocytes Relative: 10 %
MCH: 27.2 pg (ref 26.0–34.0)
MCHC: 32.4 g/dL (ref 30.0–36.0)
MCV: 83.8 fL (ref 78.0–100.0)
MONO ABS: 1.7 10*3/uL — AB (ref 0.1–1.0)
MONOS PCT: 12 %
NEUTROS ABS: 11.4 10*3/uL — AB (ref 1.7–7.7)
Neutrophils Relative %: 77 %
Platelets: 192 10*3/uL (ref 150–400)
RBC: 3.9 MIL/uL — ABNORMAL LOW (ref 4.22–5.81)
RDW: 13.4 % (ref 11.5–15.5)
WBC: 14.7 10*3/uL — ABNORMAL HIGH (ref 4.0–10.5)

## 2016-12-07 LAB — BASIC METABOLIC PANEL
Anion gap: 8 (ref 5–15)
BUN: 10 mg/dL (ref 6–20)
CALCIUM: 8.1 mg/dL — AB (ref 8.9–10.3)
CHLORIDE: 101 mmol/L (ref 101–111)
CO2: 26 mmol/L (ref 22–32)
CREATININE: 1.12 mg/dL (ref 0.61–1.24)
GFR calc non Af Amer: >60 mL/min (ref >60)
GLUCOSE: 119 mg/dL — AB (ref 65–99)
Potassium: 3.8 mmol/L (ref 3.5–5.1)
Sodium: 135 mmol/L (ref 135–145)

## 2016-12-07 MED ORDER — NALOXEGOL OXALATE 25 MG PO TABS
25.0000 mg | ORAL_TABLET | Freq: Every day | ORAL | Status: DC
  Administered 2016-12-07: 25 mg via ORAL
  Filled 2016-12-07 (×2): qty 1

## 2016-12-07 NOTE — Care Management Note (Signed)
Case Management Note  Patient Details  Name: Dwayne Acosta MRN: [id number] Date of Birth: [birth year]  Subjective/Objective:                    Action/Plan: Plan is for patient to d/c home with [facility] services when medically ready. CM met with the patient and his wife and provided a list of [place] agencies. He selected [facility]. Jermaine with [facility] notified and accepted the referral. Pt with orders for walker and 3 in 1. Jermaine with [facility] DME notified and will deliver to the room.  Wife to provide transportation home.  Expected Discharge Date:                  Expected Discharge Plan:  [place]  In-House Referral:     Discharge planning Services  CM Consult  Post Acute Care Choice:  Home Health, Durable Medical Equipment Choice offered to:  Patient, Spouse  DME Arranged:  3-N-1, Walker rolling DME Agency:  [place]:  PT, OT [place] Agency:  [place]  Status of Service:  Completed, signed off  If discussed at [place] of Stay Meetings, dates discussed:    Additional Comments:  Pollie Friar, RN 12/07/2016, 2:57 PM

## 2016-12-07 NOTE — Evaluation (Signed)
Physical Therapy Evaluation Patient Details Name: Dwayne Acosta MRN: [id number] DOB: 1956/03/02 Today's Date: 12/07/2016   History of Present Illness  Pt is a 61 y.o. male s/p L4-5 TLIF. PMHx: Anxiety, Overactive bladder, Back sx 2005, Bil rotator cuff repair.  Clinical Impression  Pt presented OOB sitting on toilet, awake and willing to participate in therapy session. Pt's spouse present throughout session. Prior to admission, pt reported that he was independent with all functional mobility and ADLs. Pt lives with wife who will be available 24/7 upon d/c to assist as needed. Pt ambulated in hallway with use of RW and min guard for safety. Pt with slow, cautious, guarded gait pattern but no overt LOB or need for physical assistance. Pt would continue to benefit from skilled physical therapy services at this time while admitted and after d/c to address the below listed limitations in order to improve overall safety and independence with functional mobility.     Follow Up Recommendations Home health PT;Supervision/Assistance - 24 hour    Equipment Recommendations  Rolling walker with 5" wheels;3in1 (PT)    Recommendations for Other Services       Precautions / Restrictions Precautions Precautions: Back;Fall Precaution Booklet Issued: No Precaution Comments: Pt able to recall 3/3 back precautions Required Braces or Orthoses: Spinal Brace Spinal Brace: Thoracolumbosacral orthotic;Applied in sitting position Restrictions Weight Bearing Restrictions: No      Mobility  Bed Mobility Overal bed mobility: Needs Assistance Bed Mobility: Rolling;Sidelying to Sit Rolling: Min guard Sidelying to sit: Min assist       General bed mobility comments: pt sitting on toilet upon arrival  Transfers Overall transfer level: Needs assistance Equipment used: Rolling walker (2 wheeled) Transfers: Sit to/from Stand Sit to Stand: Min assist         General transfer comment: assist for  stability with transition into standing from toilet as pt was a bit unsteady and guarded  Ambulation/Gait Ambulation/Gait assistance: Min guard Ambulation Distance (Feet): 75 Feet Assistive device: Rolling walker (2 wheeled) Gait Pattern/deviations: Step-through pattern;Decreased step length - right;Decreased step length - left;Decreased stride length Gait velocity: decreased Gait velocity interpretation: Below normal speed for age/gender General Gait Details: very guarded, slow and cautious gait with use of RW but no overt LOB or need for physical assistance, min gaurd for safety  Stairs            Wheelchair Mobility    Modified Rankin (Stroke Patients Only)       Balance Overall balance assessment: Needs assistance Sitting-balance support: Feet supported Sitting balance-Leahy Scale: Fair     Standing balance support: Bilateral upper extremity supported Standing balance-Leahy Scale: Poor Standing balance comment: RW for support                             Pertinent Vitals/Pain Pain Assessment: Faces Faces Pain Scale: Hurts even more Pain Location: back Pain Descriptors / Indicators: Aching;Sore Pain Intervention(s): Monitored during session;Repositioned    Home Living Family/patient expects to be discharged to:: Private residence Living Arrangements: Spouse/significant other Available Help at Discharge: Family;Available 24 hours/day (for first 2 weeks) Type of Home: House Home Access: Stairs to enter   [employer] of Steps: 2 Home Layout: Two level;Able to live on main level with bedroom/bathroom Home Equipment: Shower seat - built in;Cane - single point      Prior Function Level of Independence: Independent  Hand Dominance        Extremity/Trunk Assessment   Upper Extremity Assessment Upper Extremity Assessment: Defer to OT evaluation    Lower Extremity Assessment Lower Extremity Assessment: Generalized  weakness    Cervical / Trunk Assessment Cervical / Trunk Assessment: Other exceptions Cervical / Trunk Exceptions: s/p spinal sx  Communication   Communication: No difficulties  Cognition Arousal/Alertness: Awake/alert Behavior During Therapy: WFL for tasks assessed/performed Overall Cognitive Status: Within Functional Limits for tasks assessed                                        General Comments      Exercises     Assessment/Plan    PT Assessment Patient needs continued PT services  PT Problem List Decreased activity tolerance;Decreased balance;Decreased mobility;Decreased coordination;Decreased knowledge of use of DME;Decreased safety awareness;Decreased knowledge of precautions;Pain       PT Treatment Interventions DME instruction;Gait training;Stair training;Therapeutic exercise;Functional mobility training;Therapeutic activities;Balance training;Neuromuscular re-education;Patient/family education    PT Goals (Current goals can be found in the Care Plan section)  Acute Rehab PT Goals Patient Stated Goal: return to PLOF PT Goal Formulation: With patient/family Time For Goal Achievement: 12/21/16 Potential to Achieve Goals: Good    Frequency Min 5X/week   Barriers to discharge        Co-evaluation               AM-PAC PT "6 Clicks" Daily Activity  Outcome Measure Difficulty turning over in bed (including adjusting bedclothes, sheets and blankets)?: A Lot Difficulty moving from lying on back to sitting on the side of the bed? : A Lot Difficulty sitting down on and standing up from a chair with arms (e.g., wheelchair, bedside commode, etc,.)?: Unable Help needed moving to and from a bed to chair (including a wheelchair)?: A Little Help needed walking in hospital room?: A Little Help needed climbing 3-5 steps with a railing? : A Lot 6 Click Score: 13    End of Session Equipment Utilized During Treatment: Gait belt;Back brace Activity  Tolerance: Patient tolerated treatment well Patient left: with call bell/phone within reach;with family/visitor present;Other (comment) (sitting on toilet, instructed to use call bell when done) Nurse Communication: Mobility status;Other (comment) (pt on toilet, instructed to use call bell when done) PT Visit Diagnosis: Other abnormalities of gait and mobility (R26.89);Pain Pain - part of body:  (back)    Time: 8127-5170 PT Time Calculation (min) (ACUTE ONLY): 19 min   Charges:   PT Evaluation $PT Eval Moderate Complexity: 1 Mod     PT G Codes:        [place], PT, DPT [place] 12/07/2016, 11:24 AM

## 2016-12-07 NOTE — Progress Notes (Signed)
     Subjective: 2 Days Post-Op Procedure(s) (LRB): Right L4-5 Transforaminal lumbar interbody fusion with Depuy screws, rods and cages, local and allograft bone graft, Vivigen (N/A) Awake and alert, oriented x4. No head ache.BM last night, will start movanik as patient desires. Patient reports pain as moderate.    Objective:   VITALS:  Temp:  [98.3 F (36.8 C)-99.6 F (37.6 C)] 99.6 F (37.6 C) (10/04 0430) Pulse Rate:  [73-92] 84 (10/04 0430) Resp:  [16-20] 18 (10/04 0430) BP: (107-124)/(67-79) 113/67 (10/04 0430) SpO2:  [96 %-100 %] 96 % (10/04 0430)  Neurologically intact ABD soft Neurovascular intact Sensation intact distally Intact pulses distally Dorsiflexion/Plantar flexion intact Incision: scant drainage   LABS  Recent Labs  12/05/16 0640 12/06/16 0305  HGB 12.2* 11.6*  WBC 4.9 13.0*  PLT 276 247    Recent Labs  12/05/16 0640 12/06/16 0305  NA 138 133*  K 3.8 4.2  CL 107 98*  CO2 24 25  BUN 15 10  CREATININE 1.03 1.15  GLUCOSE 91 123*    Recent Labs  12/05/16 0640  INR 1.07     Assessment/Plan: 2 Days Post-Op Procedure(s) (LRB): Right L4-5 Transforaminal lumbar interbody fusion with Depuy screws, rods and cages, local and allograft bone graft, Vivigen (N/A)  Advance diet Up with therapy D/C IV fluids  Basil Dess 12/07/2016, 9:06 AM Patient ID: Dwayne Acosta, male   DOB: 12-20-1955, 61 y.o.   MRN: [id number]

## 2016-12-07 NOTE — Evaluation (Signed)
Occupational Therapy Evaluation Patient Details Name: Dwayne Acosta MRN: [id number] DOB: 1955-12-30 Today's Date: 12/07/2016    History of Present Illness Pt is a 61 y.o. male s/p L4-5 TLIF. PMHx: Anxiety, Overactive bladder, Back sx 2005, Bil rotator cuff repair.   Clinical Impression   Pt reports he was independent with ADL PTA. Currently pt requires min assist for functional mobility and max assist overall for ADL. Began back, safety, and ADL education with pt and wife. Pt planning to d/c home with 24/7 supervision from his wife for the first 2 weeks. Recommending HHOT for follow up to maximize independence and safety with ADL and functional mobility upon return home. Pt would benefit from continued skilled OT to address established goals.    Follow Up Recommendations  Home health OT;Supervision/Assistance - 24 hour    Equipment Recommendations  3 in 1 bedside commode    Recommendations for Other Services PT consult     Precautions / Restrictions Precautions Precautions: Back;Fall Precaution Booklet Issued: No Precaution Comments: Educated pt and wife on back precautions Required Braces or Orthoses: Spinal Brace Spinal Brace: Thoracolumbosacral orthotic;Applied in sitting position Restrictions Weight Bearing Restrictions: No      Mobility Bed Mobility Overal bed mobility: Needs Assistance Bed Mobility: Rolling;Sidelying to Sit Rolling: Min guard Sidelying to sit: Min assist       General bed mobility comments: Assist for trunk elevation to upright sitting. Cues for log roll technique and hand placement. HOB flat with use of bed rail.  Transfers Overall transfer level: Needs assistance Equipment used: Rolling walker (2 wheeled) Transfers: Sit to/from Stand Sit to Stand: Min assist         General transfer comment: Light min assist to boost up from EOB and steady once in standing. Cues for hand placement and technique    Balance Overall balance assessment:  Needs assistance Sitting-balance support: Feet supported;Bilateral upper extremity supported Sitting balance-Leahy Scale: Fair     Standing balance support: Bilateral upper extremity supported Standing balance-Leahy Scale: Poor Standing balance comment: RW for support                           ADL either performed or assessed with clinical judgement   ADL Overall ADL's : Needs assistance/impaired Eating/Feeding: Set up;Sitting   Grooming: Set up;Supervision/safety;Sitting   Upper Body Bathing: Minimal assistance;Sitting   Lower Body Bathing: Maximal assistance;Sit to/from stand   Upper Body Dressing : Maximal assistance;Sitting Upper Body Dressing Details (indicate cue type and reason): to don brace Lower Body Dressing: Maximal assistance;Sit to/from stand   Toilet Transfer: Minimal assistance;Ambulation;BSC;RW Toilet Transfer Details (indicate cue type and reason): Simulated by sit to stand from EOB with functional mobility in room         Functional mobility during ADLs: Minimal assistance;Rolling walker General ADL Comments: Educated on not sitting up in chair for >45 min-1hr at a time. Recommend frequent mobility     Vision         Perception     Praxis      Pertinent Vitals/Pain Pain Assessment: Faces Faces Pain Scale: Hurts even more Pain Location: back Pain Descriptors / Indicators: Aching;Sore Pain Intervention(s): Monitored during session;Repositioned;RN gave pain meds during session     Hand Dominance     Extremity/Trunk Assessment Upper Extremity Assessment Upper Extremity Assessment: Overall WFL for tasks assessed   Lower Extremity Assessment Lower Extremity Assessment: Defer to PT evaluation   Cervical / Trunk Assessment Cervical /  Trunk Assessment: Other exceptions Cervical / Trunk Exceptions: s/p spinal sx   Communication Communication Communication: No difficulties   Cognition Arousal/Alertness: Awake/alert Behavior During  Therapy: WFL for tasks assessed/performed Overall Cognitive Status: Within Functional Limits for tasks assessed                                     General Comments       Exercises     Shoulder Instructions      Home Living Family/patient expects to be discharged to:: Private residence Living Arrangements: Spouse/significant other Available Help at Discharge: Family;Available 24 hours/day (for first 2 weeks) Type of Home: House Home Access: Stairs to enter [employer] of Steps: 2   Home Layout: Two level;Able to live on main level with bedroom/bathroom     Bathroom Shower/Tub: Occupational psychologist: Handicapped height     Home Equipment: [place] in          Prior Functioning/Environment Level of Independence: Independent                 OT Problem List: Decreased strength;Decreased activity tolerance;Impaired balance (sitting and/or standing);Decreased knowledge of use of DME or AE;Decreased knowledge of precautions;Pain      OT Treatment/Interventions: Self-care/ADL training;Energy conservation;DME and/or AE instruction;Therapeutic activities;Patient/family education;Balance training    OT Goals(Current goals can be found in the care plan section) Acute Rehab OT Goals Patient Stated Goal: get back to PLOF OT Goal Formulation: With patient/family Time For Goal Achievement: 12/21/16 Potential to Achieve Goals: Good ADL Goals Pt Will Perform Lower Body Bathing: with supervision;sit to/from stand (with or without AE) Pt Will Perform Lower Body Dressing: with supervision;sit to/from stand (with or without AE) Pt Will Transfer to Toilet: with supervision;ambulating;bedside commode Pt Will Perform Toileting - Clothing Manipulation and hygiene: with supervision;sit to/from stand Pt Will Perform Tub/Shower Transfer: Shower transfer;with supervision;ambulating;3 in 1;rolling walker Additional ADL Goal #1: Pt will  independently verbally recall 3/3 back precautions and maintain throughout ADL. Additional ADL Goal #2: Pt will don/doff back brace with set up as precursor to ADL.  OT Frequency: Min 2X/week   Barriers to D/C:            Co-evaluation              AM-PAC PT "6 Clicks" Daily Activity     Outcome Measure Help from another person eating meals?: None Help from another person taking care of personal grooming?: A Little Help from another person toileting, which includes using toliet, bedpan, or urinal?: A Little Help from another person bathing (including washing, rinsing, drying)?: A Lot Help from another person to put on and taking off regular upper body clothing?: A Lot Help from another person to put on and taking off regular lower body clothing?: A Lot 6 Click Score: 16   End of Session Equipment Utilized During Treatment: Gait belt;Rolling walker;Back brace Nurse Communication: Mobility status (RN present in room initially for mobility)  Activity Tolerance: Patient tolerated treatment well Patient left: in chair;with call bell/phone within reach;with family/visitor present  OT Visit Diagnosis: Unsteadiness on feet (R26.81);Other abnormalities of gait and mobility (R26.89);Pain Pain - part of body:  (back)                Time: 6644-0347 OT Time Calculation (min): 23 min Charges:  OT General Charges $OT Visit: 1 Visit OT Evaluation $OT Eval  Moderate Complexity: 1 Mod OT Treatments $Self Care/Home Management : 8-22 mins G-Codes:     Jaeceon Michelin A. Ulice Brilliant, M.S., OTR/L Pager: [place] 12/07/2016, 10:28 AM

## 2016-12-08 ENCOUNTER — Inpatient Hospital Stay (HOSPITAL_COMMUNITY): Payer: 59

## 2016-12-08 LAB — URINALYSIS, ROUTINE W REFLEX MICROSCOPIC
BACTERIA UA: NONE SEEN
BILIRUBIN URINE: NEGATIVE
Glucose, UA: NEGATIVE mg/dL
Ketones, ur: NEGATIVE mg/dL
Leukocytes, UA: NEGATIVE
NITRITE: NEGATIVE
PROTEIN: 30 mg/dL — AB
SPECIFIC GRAVITY, URINE: 1.015 (ref 1.005–1.030)
Squamous Epithelial / LPF: NONE SEEN
pH: 7 (ref 5.0–8.0)

## 2016-12-08 MED ORDER — HYDROCODONE-ACETAMINOPHEN 5-325 MG PO TABS: 1.0000 | ORAL_TABLET | Freq: Four times a day (QID) | ORAL | 0 refills | Status: DC | PRN

## 2016-12-08 MED ORDER — METHOCARBAMOL 750 MG PO TABS: 750.0000 mg | ORAL_TABLET | Freq: Three times a day (TID) | ORAL | 0 refills | Status: DC | PRN

## 2016-12-08 MED ORDER — NALOXEGOL OXALATE 25 MG PO TABS: 25.0000 mg | ORAL_TABLET | Freq: Every day | ORAL | 0 refills | Status: DC

## 2016-12-08 MED ORDER — TRAMADOL HCL 50 MG PO TABS: ORAL_TABLET | ORAL | 0 refills | Status: DC

## 2016-12-08 MED FILL — Ephedrine Sulfate Inj 50 MG/ML: INTRAMUSCULAR | Qty: 1 | Status: AC

## 2016-12-08 MED FILL — Phenylephrine-NaCl IV Solution 10 MG/250ML-0.9%: INTRAVENOUS | Qty: 250 | Status: AC

## 2016-12-08 MED FILL — Ondansetron HCl Inj 4 MG/2ML (2 MG/ML): INTRAMUSCULAR | Qty: 2 | Status: AC

## 2016-12-08 MED FILL — Propofol IV Emul 200 MG/20ML (10 MG/ML): INTRAVENOUS | Qty: 20 | Status: AC

## 2016-12-08 MED FILL — Rocuronium Bromide IV Soln 100 MG/10ML (10 MG/ML): INTRAVENOUS | Qty: 20 | Status: AC

## 2016-12-08 MED FILL — Midazolam HCl Inj 2 MG/2ML (Base Equivalent): INTRAMUSCULAR | Qty: 2 | Status: AC

## 2016-12-08 MED FILL — Phenylephrine HCl Inj 10 MG/ML: INTRAMUSCULAR | Qty: 1 | Status: AC

## 2016-12-08 MED FILL — Lidocaine HCl IV Inj 20 MG/ML: INTRAVENOUS | Qty: 5 | Status: AC

## 2016-12-08 MED FILL — Sugammadex Sodium IV 200 MG/2ML (Base Equivalent): INTRAVENOUS | Qty: 4 | Status: AC

## 2016-12-08 MED FILL — Cefazolin Sodium For IV Soln 1 GM: INTRAVENOUS | Qty: 2 | Status: AC

## 2016-12-08 MED FILL — Fentanyl Citrate Preservative Free (PF) Inj 100 MCG/2ML: INTRAMUSCULAR | Qty: 4 | Status: AC

## 2016-12-08 MED FILL — Heparin Sodium (Porcine) Inj 1000 Unit/ML: INTRAMUSCULAR | Qty: 30 | Status: AC

## 2016-12-08 MED FILL — Lactated Ringer's Solution: INTRAVENOUS | Qty: 1000 | Status: AC

## 2016-12-08 NOTE — Therapy (Signed)
Occupational Therapy Treatment Patient Details Name: Dwayne Acosta MRN: [id number] DOB: May 07, 1955 Today's Date: 12/08/2016    History of present illness Pt is a 61 y.o. male s/p L4-5 TLIF. PMHx: Anxiety, Overactive bladder, Back sx 2005, Bil rotator cuff repair.   OT comments  Pt is making great progress towards therapy goals. Focus of today's session on AE for LB ADLs and shower transfer. Pt was able to return demonstration of AE for LB ADLs while maintaining back precautions. Pt educated on safe walk in shower transfer technique and pt was able to complete transfer with min assist for stability. OT will continue to follow to address established goals.      Follow Up Recommendations  Home health OT;Supervision/Assistance - 24 hour    Equipment Recommendations  3 in 1 bedside commode    Recommendations for Other Services      Precautions / Restrictions Precautions Precautions: Back;Fall Precaution Booklet Issued: Yes (comment) Precaution Comments: Pt able to recall 3/3 back precautions. Reviewed back precautions handout.  Required Braces or Orthoses: Spinal Brace Spinal Brace: Thoracolumbosacral orthotic;Applied in sitting position Restrictions Weight Bearing Restrictions: No       Mobility Bed Mobility               General bed mobility comments: Pt sitting in chair upon arrival.   Transfers Overall transfer level: Needs assistance Equipment used: Rolling walker (2 wheeled) Transfers: Sit to/from Stand Sit to Stand: Min assist         General transfer comment: Min assist for balance and stability. Pt with increased time and effort to stand but no physical assist.     Balance Overall balance assessment: Needs assistance Sitting-balance support: Feet supported Sitting balance-Leahy Scale: Fair Sitting balance - Comments: Pt able to don sock using sock aide while sitting on chair with no LOB    Standing balance support: Bilateral upper extremity  supported;During functional activity Standing balance-Leahy Scale: Fair Standing balance comment: Relies on RW for support. Able to complete walk in shower transfer with no LOB.                           ADL either performed or assessed with clinical judgement   ADL Overall ADL's : Needs assistance/impaired       Grooming Details (indicate cue type and reason): Pt educated on compensatory technique for oral care with back precautions.      Lower Body Bathing: Minimal assistance;With adaptive equipment;Sitting/lateral leans Lower Body Bathing Details (indicate cue type and reason): Pt educated on use of long handle sponge to maintain back precautions while bathing.      Lower Body Dressing: Minimal assistance;With adaptive equipment;Sit to/from stand Lower Body Dressing Details (indicate cue type and reason): Pt educated on reacher and sock aide to maintain back precautions while donning LB clothing.  Toilet Transfer: Minimal assistance;Ambulation;RW       Tub/ Shower Transfer: Minimal assistance;Ambulation;Shower Technical sales engineer Details (indicate cue type and reason): Pt able to complete walk in shower transfer using compensatory strategies.  Functional mobility during ADLs: Minimal assistance;Rolling walker General ADL Comments: Pt educated on AE to increase independence with LB ADLs.      Vision       Perception     Praxis      Cognition Arousal/Alertness: Awake/alert Behavior During Therapy: WFL for tasks assessed/performed Overall Cognitive Status: Within Functional Limits for tasks assessed  Exercises     Shoulder Instructions       General Comments Pt's wife present during session.     Pertinent Vitals/ Pain       Pain Assessment: 0-10 Pain Score: 4  Pain Location: back Pain Descriptors / Indicators: Aching;Sore Pain Intervention(s): Monitored during  session  Home Living                                          Prior Functioning/Environment              Frequency  Min 2X/week        Progress Toward Goals  OT Goals(current goals can now be found in the care plan section)  Progress towards OT goals: Progressing toward goals  Acute Rehab OT Goals Patient Stated Goal: return to PLOF OT Goal Formulation: With patient/family Time For Goal Achievement: 12/21/16 Potential to Achieve Goals: Good ADL Goals Pt Will Perform Lower Body Bathing: with supervision;sit to/from stand Pt Will Perform Lower Body Dressing: with supervision;sit to/from stand Pt Will Transfer to Toilet: with supervision;ambulating;bedside commode Pt Will Perform Toileting - Clothing Manipulation and hygiene: with supervision;sit to/from stand Pt Will Perform Tub/Shower Transfer: Shower transfer;with supervision;ambulating;3 in 1;rolling walker Additional ADL Goal #1: Pt will independently verbally recall 3/3 back precautions and maintain throughout ADL. Additional ADL Goal #2: Pt will don/doff back brace with set up as precursor to ADL.  Plan Discharge plan remains appropriate    Co-evaluation                 AM-PAC PT "6 Clicks" Daily Activity     Outcome Measure   Help from another person eating meals?: None Help from another person taking care of personal grooming?: None Help from another person toileting, which includes using toliet, bedpan, or urinal?: A Little Help from another person bathing (including washing, rinsing, drying)?: A Little Help from another person to put on and taking off regular upper body clothing?: A Little Help from another person to put on and taking off regular lower body clothing?: A Little 6 Click Score: 20    End of Session Equipment Utilized During Treatment: Gait belt;Rolling walker (AE kit)  OT Visit Diagnosis: Unsteadiness on feet (R26.81);Other abnormalities of gait and mobility  (R26.89);Pain Pain - part of body:  (Back)   Activity Tolerance Patient tolerated treatment well   Patient Left in chair;with call bell/phone within reach;with family/visitor present   Nurse Communication Mobility status        Time: 3300-7622 OT Time Calculation (min): 29 min  Charges: OT General Charges $OT Visit: 1 Visit OT Treatments $Self Care/Home Management : 23-37 mins  Boykin Peek, [place] #[phone]    Boykin Peek 12/08/2016, 3:04 PM

## 2016-12-08 NOTE — Discharge Instructions (Signed)
ORTHOPEDIC DISCHARGE INSTRUCTION   -no driving, lifting, bending, twisting  -Must wear  brace at all times when out of bed and up and ambulating.  -okay to shower 5 days postop. Do not apply ointments to wound. Daily dressing changes with 4 x 4 gauze and tape.  -call our office immediately if there are any questions or concerns. If you begin to experience any headaches, lightheadedness, dizziness we should be notified immediately and you should go to the emergency room for evaluation due to having a dural repair in surgery.

## 2016-12-08 NOTE — Progress Notes (Signed)
Subjective: Patient doing well. Good pain control. Has done hall ambulation. States that he wants to go home to.  states that the oxycodone that he is currently on is causing increased issues with his overactive bladder. Constipation issues better with Movantik.   denies headaches, lightheadedness, dizziness, nausea. Denies melena or hematochezia   Objective: Vital signs in last 24 hours: Temp:  [97.8 F (36.6 C)-100.7 F (38.2 C)] 97.8 F (36.6 C) (10/05 0423) Pulse Rate:  [83-92] 83 (10/05 0423) Resp:  [18] 18 (10/05 0423) BP: (108-117)/(61-75) 108/61 (10/05 0423) SpO2:  [97 %-100 %] 97 % (10/05 0423)  Intake/Output from previous day: 10/04 0701 - 10/05 0700 In: -  Out: 800 [Urine:800] Intake/Output this shift: No intake/output data recorded.   Recent Labs  12/06/16 0305 12/07/16 0923  HGB 11.6* 10.6*    Recent Labs  12/06/16 0305 12/07/16 0923  WBC 13.0* 14.7*  RBC 4.11* 3.90*  HCT 34.3* 32.7*  PLT 247 192    Recent Labs  12/06/16 0305 12/07/16 0923  NA 133* 135  K 4.2 3.8  CL 98* 101  CO2 25 26  BUN 10 10  CREATININE 1.15 1.12  GLUCOSE 123* 119*  CALCIUM 8.5* 8.1*   No results for input(s): LABPT, INR in the last 72 hours.  Exam; Patient alert and oriented. Wound looks good. New Steri-Strips applied.No drainage or signs of infection. Bilateral calves nontender. Neurovascularly intact. No focal motor deficits.    Assessment/Plan: Discharge patient home today. Must wear lumbar brace at all times when he is out of bed and up and ambulating. he has concerns with taking strong narcotic pain medication due to issues with constipation and aggravating his overactive bladder. He would like to continue using Ultram which he was taking before surgery. patient is status post laparascopic  appendectomy May 2018 and with his current issues regarding his bowels I recommend that he follow-up with his primary care physician to see if GI consult is indicated. Patient does  report having an episode of black stool several weeks ago. Scripts on chart for Ultram, Norco,Robaxin and movantik (constipation).  Follow-up in our office as scheduled. If there are any issues he should contact us immediately.  All questions answered.  Benjiman Core 12/08/2016, 10:49 AM

## 2016-12-08 NOTE — Progress Notes (Signed)
Patient discharged home. discharged instructions were reviewed with patient and wife. Patient and wife verbalized understanding.

## 2016-12-08 NOTE — Progress Notes (Signed)
Patient ID: Dwayne Acosta, male   DOB: 1955-05-23, 61 y.o.   MRN: [id number]  Spoke with RN on the floor earlier and there is concern about patient's elevated white count from yesterday increased temperature earlier. I ordered chest x-ray and UA. Chest x-ray report read no edema or consolidation. Lungs are clear. UA showed small hemoglobin and 30 protein. Urine culture pending.  RN rechecked temperature a few minutes ago and this was 100.2. I spoke with Dr. Louanne Skye and he feels that the patient is okay to discharge home today as planned. Patient should continue using incentive spirometry. RN to advise patient that his temperature should be checked at home to 3 times per day. If this is 100.5 or above he should contact our office over the weekend to speak to the on-call physician.

## 2016-12-08 NOTE — Progress Notes (Signed)
Discharging home. Has walker and 3 in 1 in room. Set up with [facility] for [facility] services. No further CM needs.

## 2016-12-09 DIAGNOSIS — M48062 Spinal stenosis, lumbar region with neurogenic claudication: Secondary | ICD-10-CM | POA: Diagnosis not present

## 2016-12-09 DIAGNOSIS — N3281 Overactive bladder: Secondary | ICD-10-CM | POA: Diagnosis not present

## 2016-12-09 DIAGNOSIS — Z4789 Encounter for other orthopedic aftercare: Secondary | ICD-10-CM | POA: Diagnosis not present

## 2016-12-09 LAB — URINE CULTURE: Culture: NO GROWTH

## 2016-12-11 ENCOUNTER — Telehealth (INDEPENDENT_AMBULATORY_CARE_PROVIDER_SITE_OTHER): Payer: Self-pay

## 2016-12-11 ENCOUNTER — Telehealth (INDEPENDENT_AMBULATORY_CARE_PROVIDER_SITE_OTHER): Payer: Self-pay | Admitting: Radiology

## 2016-12-11 NOTE — Telephone Encounter (Signed)
Ben called and wants to get orders for HHPT 1x week x1, 2x week x 2 weeks, pls call to advise if ok.

## 2016-12-11 NOTE — Telephone Encounter (Signed)
Patient needs a postop appointment. Stated that discharge papers said to return in 1 week.  Patient had surgery on Tuesday 12/05/16.  Cb# is [phone].  Please advise.

## 2016-12-12 ENCOUNTER — Ambulatory Visit (INDEPENDENT_AMBULATORY_CARE_PROVIDER_SITE_OTHER): Payer: 59 | Admitting: Specialist

## 2016-12-12 ENCOUNTER — Encounter (INDEPENDENT_AMBULATORY_CARE_PROVIDER_SITE_OTHER): Payer: Self-pay | Admitting: Specialist

## 2016-12-12 ENCOUNTER — Ambulatory Visit (INDEPENDENT_AMBULATORY_CARE_PROVIDER_SITE_OTHER): Payer: 59

## 2016-12-12 ENCOUNTER — Telehealth (INDEPENDENT_AMBULATORY_CARE_PROVIDER_SITE_OTHER): Payer: Self-pay | Admitting: Specialist

## 2016-12-12 VITALS — BP 112/80 | HR 64 | Ht 69.0 in | Wt 180.0 lb

## 2016-12-12 DIAGNOSIS — Z981 Arthrodesis status: Secondary | ICD-10-CM

## 2016-12-12 NOTE — Telephone Encounter (Signed)
Coming in on 12/12/16 @215 

## 2016-12-12 NOTE — Discharge Summary (Signed)
Patient ID: Dwayne Acosta MRN: [id number] DOB/AGE: Aug 03, 1955 61 y.o.  Admit date: 12/05/2016 Discharge date: 12/12/2016  Admission Diagnoses:  Principal Problem:   Spinal stenosis of lumbar region Active Problems:   Spinal stenosis of lumbar region with neurogenic claudication   Discharge Diagnoses:  Principal Problem:   Spinal stenosis of lumbar region Active Problems:   Spinal stenosis of lumbar region with neurogenic claudication  status post Procedure(s): Right L4-5 Transforaminal lumbar interbody fusion with Depuy screws, rods and cages, local and allograft bone graft, Vivigen  Past Medical History:  Diagnosis Date  . Anxiety   . Complication of anesthesia    pain med caused bladder,stomach issue-unable to void"stomach swoll up"13 yrs ago note of surgery  . OAB (overactive bladder)   . Overactive bladder     Surgeries: Procedure(s): Right L4-5 Transforaminal lumbar interbody fusion with Depuy screws, rods and cages, local and allograft bone graft, Vivigen on 12/05/2016   Consultants:   Discharged Condition: Improved  Hospital Course: Dwayne Acosta is an 61 y.o. male who was admitted 12/05/2016 for operative treatment of Spinal stenosis of lumbar region. Patient failed conservative treatments (please see the history and physical for the specifics) and had severe unremitting pain that affects sleep, daily activities and work/hobbies. After pre-op clearance, the patient was taken to the operating room on 12/05/2016 and underwent  Procedure(s): Right L4-5 Transforaminal lumbar interbody fusion with Depuy screws, rods and cages, local and allograft bone graft, Vivigen.    Patient was given perioperative antibiotics:  Anti-infectives    Start     Dose/Rate Route Frequency Ordered Stop   12/05/16 1900  ceFAZolin (ANCEF) IVPB 2g/100 mL premix     2 g 200 mL/hr over 30 Minutes Intravenous Every 8 hours 12/05/16 1741 12/06/16 0400   12/05/16 0630  ceFAZolin (ANCEF) IVPB  2g/100 mL premix  Status:  Discontinued     2 g 200 mL/hr over 30 Minutes Intravenous On call to O.R. 12/05/16 0622 12/05/16 1714       Patient was given sequential compression devices and early ambulation to prevent DVT.   Patient benefited maximally from hospital stay and there were no complications. At the time of discharge, the patient was urinating/moving their bowels without difficulty, tolerating a regular diet, pain is controlled with oral pain medications and they have been cleared by PT/OT.   Recent vital signs: No data found.    Recent laboratory studies: No results for input(s): WBC, HGB, HCT, PLT, NA, K, CL, CO2, BUN, CREATININE, GLUCOSE, INR, CALCIUM in the last 72 hours.  Invalid input(s): PT, 2   Discharge Medications:   Allergies as of 12/08/2016   No Known Allergies     Medication List    STOP taking these medications   lidocaine 5 % Commonly known as:  LIDODERM   methylPREDNISolone 4 MG Tbpk tablet Commonly known as:  MEDROL DOSEPAK   mupirocin ointment 2 % Commonly known as:  BACTROBAN   OVER THE COUNTER MEDICATION     TAKE these medications   clonazePAM 0.5 MG tablet Commonly known as:  KLONOPIN Take 1 tablet (0.5 mg total) by mouth 2 (two) times daily as needed for anxiety.   fluticasone 50 MCG/ACT nasal spray Commonly known as:  FLONASE Place 2 sprays into both nostrils at bedtime.   HYDROcodone-acetaminophen 5-325 MG tablet Commonly known as:  NORCO Take 1 tablet by mouth every 6 (six) hours as needed for moderate pain.   latanoprost 0.005 % ophthalmic solution Commonly known  as:  XALATAN Place 1 drop into both eyes at bedtime.   methocarbamol 750 MG tablet Commonly known as:  ROBAXIN Take 1 tablet (750 mg total) by mouth every 8 (eight) hours as needed for muscle spasms.   minoxidil 2 % external solution Commonly known as:  ROGAINE Apply topically 1 day or 1 dose.   naloxegol oxalate 25 MG Tabs tablet Commonly known as:   MOVANTIK Take 1 tablet (25 mg total) by mouth daily.   sildenafil 20 MG tablet Commonly known as:  REVATIO TAKE 2-5 TABLETS BY MOUTH ONCE A DAY AS NEEDED FOR SEXUAL ACTIVITY   traMADol 50 MG tablet Commonly known as:  ULTRAM Take 1-2 tablets every 6 hours PRN pain What changed:  how much to take  how to take this  when to take this  reasons to take this  additional instructions   VESICARE 10 MG tablet Generic drug:  solifenacin Take 10 mg by mouth at bedtime.       Diagnostic Studies: Dg Cervical Spine Complete  Result Date: 11/21/2016 CLINICAL DATA:  Neck pain and stiffness EXAM: CERVICAL SPINE - COMPLETE 4+ VIEW COMPARISON:  None. FINDINGS: Frontal, lateral, open-mouth odontoid, and bilateral oblique views were obtained. There is no fracture. There is just over 2 mm of anterolisthesis of C7 on T1. No other spondylolisthesis. Prevertebral soft tissues and predental space regions are normal. There is moderately severe disc space narrowing at C4-5, C5-6, and C6-7. There is calcification in the anterior longitudinal ligament at C5-6 and C6-7. There is exit foraminal narrowing due to bony hypertrophy at C4-5, C5-6, and C6-7 bilaterally. Lung apices are clear. IMPRESSION: No fracture. Slight spondylolisthesis at C7-T1 is felt to be due to underlying spondylosis. There is multilevel osteoarthritic change. Electronically Signed   By: Lowella Grip III M.D.   On: 11/21/2016 08:26   Dg Lumbar Spine Complete  Result Date: 12/05/2016 CLINICAL DATA:  L4-5 fusion EXAM: DG C-ARM 61-120 MIN; LUMBAR SPINE - COMPLETE 4+ VIEW COMPARISON:  11/21/2016 FLUOROSCOPY TIME:  Fluoroscopy Time:  55 seconds Radiation Exposure Index (if provided by the fluoroscopic device): Not available Number of Acquired Spot Images: 4 FINDINGS: Interbody fusion and pedicle screws are noted at L4-5 with posterior fixation. The previously seen fusion at L5-S1 is again seen although the pedicle hardware has been removed.  No acute abnormality is noted. IMPRESSION: Interval fusion at L4-5. Electronically Signed   By: Inez Catalina M.D.   On: 12/05/2016 14:11   Dg Abd 1 View  Result Date: 11/21/2016 CLINICAL DATA:  Abdominal pain and constipation EXAM: ABDOMEN - 1 VIEW COMPARISON:  CT abdomen and pelvis November 19, 2016 FINDINGS: There is moderate stool throughout the colon. There is no bowel dilatation or air-fluid level to suggest bowel obstruction. No free air. There are phleboliths in the pelvis. There is postoperative change at L5 and S1. IMPRESSION: Moderate stool in colon.  No bowel obstruction or free air. Electronically Signed   By: Lowella Grip III M.D.   On: 11/21/2016 08:26   Dg Chest Port 1 View  Result Date: 12/08/2016 CLINICAL DATA:  Fever and chest pain EXAM: PORTABLE CHEST 1 VIEW COMPARISON:  January 01, 2012 FINDINGS: Lungs are clear. Heart size and pulmonary vascularity are normal. No adenopathy. No pneumothorax. No bone lesions. IMPRESSION: No edema or consolidation. Electronically Signed   By: Lowella Grip III M.D.   On: 12/08/2016 12:06   Dg C-arm 61-120 Min  Result Date: 12/05/2016 CLINICAL DATA:  L4-5 fusion  EXAM: DG C-ARM 61-120 MIN; LUMBAR SPINE - COMPLETE 4+ VIEW COMPARISON:  11/21/2016 FLUOROSCOPY TIME:  Fluoroscopy Time:  55 seconds Radiation Exposure Index (if provided by the fluoroscopic device): Not available Number of Acquired Spot Images: 4 FINDINGS: Interbody fusion and pedicle screws are noted at L4-5 with posterior fixation. The previously seen fusion at L5-S1 is again seen although the pedicle hardware has been removed. No acute abnormality is noted. IMPRESSION: Interval fusion at L4-5. Electronically Signed   By: Inez Catalina M.D.   On: 12/05/2016 14:11   Ct Renal Stone Study  Result Date: 11/19/2016 CLINICAL DATA:  Left-sided abdominal pain starting yesterday. EXAM: CT ABDOMEN AND PELVIS WITHOUT CONTRAST TECHNIQUE: Multidetector CT imaging of the abdomen and pelvis was  performed following the standard protocol without IV contrast. COMPARISON:  07/20/2016 FINDINGS: Lower chest: Right greater than left base subsegmental atelectasis. Normal heart size without pericardial or pleural effusion. Hepatobiliary: Right hepatic lobe cyst. Vague left hepatic lobe hypoattenuating lesion has been present back to 2013, consistent with a benign etiology. Normal gallbladder, without biliary ductal dilatation. Pancreas: Normal, without mass or ductal dilatation. Spleen: Normal in size, without focal abnormality. Adrenals/Urinary Tract: Normal left adrenal gland. Right adrenal 9 mm low-density nodule, most consistent with an adenoma. No renal calculi or hydronephrosis. No hydroureter or ureteric calculi. No bladder calculi. Stomach/Bowel: Normal stomach, without wall thickening. Colonic stool burden suggests constipation. Normal terminal ileum. appendectomy. Normal small bowel. Vascular/Lymphatic: Normal caliber of the aorta and branch vessels. No abdominopelvic adenopathy. Reproductive: Normal prostate. Other: No significant free fluid. Musculoskeletal: Lumbosacral spine fixation. IMPRESSION: 1.  No urinary tract calculi or hydronephrosis. 2.  Possible constipation. Electronically Signed   By: Abigail Miyamoto M.D.   On: 11/19/2016 10:57      Follow-up Information    Jessy Oto, MD Follow up in 1 week(s).   Specialty:  Orthopedic Surgery Why:  For wound re-check Contact information: [place] [address] [phone]           Discharge Plan:  discharge to home  Disposition:     Signed: Benjiman Core 12/12/2016, 1:23 PM

## 2016-12-12 NOTE — Progress Notes (Signed)
   Post-Op Visit Note   Patient: Dwayne Acosta           Date of Birth: 17-Sep-1955           MRN: [id number] Visit Date: 12/12/2016 PCP: Sharilyn Sites, MD   Assessment & Plan: One week post L4-5 fusion with removal of hardware L5-S1, dural tear with CSF leak repaired and no sign of recurrence.   Chief Complaint:  Chief Complaint  Patient presents with  . Lower Back - Routine Post Op   Visit Diagnoses:  1. S/P lumbar spinal fusion     Plan: Avoid frequent bending and stooping  No lifting greater than 10 lbs. May use ice or moist heat for pain. Weight loss is of benefit. Handicap license is approved.    Follow-Up Instructions: No Follow-up on file.   Orders:  Orders Placed This Encounter  Procedures  . XR Lumbar Spine 2-3 Views   No orders of the defined types were placed in this encounter.   Imaging: Xr Lumbar Spine 2-3 Views  Result Date: 12/12/2016 AP and lateral lumbar spine with pedicle screws and rods L4 to L5, with an interbody cage in good position and alignment. No abnormalitys.    PMFS History: Patient Active Problem List   Diagnosis Date Noted  . Spinal stenosis of lumbar region 12/05/2016    Priority: High    Class: Chronic  . Spinal stenosis of lumbar region with neurogenic claudication 12/05/2016  . Degenerative disc disease, cervical 11/21/2016  . Spondylosis without myelopathy or radiculopathy, cervical region 11/21/2016  . Acute appendicitis 07/20/2016   Past Medical History:  Diagnosis Date  . Anxiety   . Complication of anesthesia    pain med caused bladder,stomach issue-unable to void"stomach swoll up"13 yrs ago note of surgery  . OAB (overactive bladder)   . Overactive bladder     Family History  Problem Relation Age of Onset  . High blood pressure Mother   . Heart attack Father   . Parkinson's disease Father   . Stroke Paternal Grandmother   . Bone cancer Maternal Grandfather     Past Surgical History:  Procedure Laterality  Date  . APPENDECTOMY    . BACK SURGERY  2005  . FOOT SURGERY Left    bunion  . LAPAROSCOPIC APPENDECTOMY N/A 07/20/2016   Procedure: APPENDECTOMY LAPAROSCOPIC;  Surgeon: Armandina Gemma, MD;  Location: [facility];  Service: General;  Laterality: N/A;  . LUMBAR FUSION  12/05/2016  . ROTATOR CUFF REPAIR Bilateral    Social History   Occupational History  .  [employer]   Social History Main Topics  . Smoking status: Never Smoker  . Smokeless tobacco: Never Used  . Alcohol use 0.0 oz/week     Comment: occ-gin or vodka 1-2 week  . Drug use: No  . Sexual activity: Not on file

## 2016-12-12 NOTE — Telephone Encounter (Signed)
Patient advised needing handicap placard and want to know when he can drive. The number to contact patient is [phone]

## 2016-12-12 NOTE — Telephone Encounter (Signed)
I called and lmom giving verbal ok

## 2016-12-12 NOTE — Patient Instructions (Signed)
Plan: Avoid frequent bending and stooping  No lifting greater than 10 lbs. May use ice or moist heat for pain. Weight loss is of benefit. Handicap license is approved.

## 2016-12-13 NOTE — Telephone Encounter (Signed)
Form mailed to patient and he is aware that this has been done

## 2016-12-25 ENCOUNTER — Telehealth (INDEPENDENT_AMBULATORY_CARE_PROVIDER_SITE_OTHER): Payer: Self-pay | Admitting: Specialist

## 2016-12-25 NOTE — Telephone Encounter (Signed)
Patient called asked if he can be referred for outpatient therapy. Patient advised he want to go to [facility]. The therapist name is Adalberto Ill.

## 2016-12-25 NOTE — Telephone Encounter (Signed)
Patient called asked if he can be referred for outpatient therapy. Patient advised he want to go to [facility]. The therapist name is Adalberto Ill. The number to contact patient is [phone]

## 2016-12-26 ENCOUNTER — Other Ambulatory Visit (INDEPENDENT_AMBULATORY_CARE_PROVIDER_SITE_OTHER): Payer: Self-pay | Admitting: Specialist

## 2016-12-28 ENCOUNTER — Encounter (INDEPENDENT_AMBULATORY_CARE_PROVIDER_SITE_OTHER): Payer: Self-pay | Admitting: Radiology

## 2016-12-29 NOTE — Telephone Encounter (Signed)
Walking is the best exercise for now and after he gets out of a brace formal therapy. Probably not necessary until he is  Able to be out of the brace.

## 2017-01-01 NOTE — Telephone Encounter (Signed)
I called and advised that right now he needs to be walking and we would discuss the therapy after he comes out of his brace.

## 2017-01-01 NOTE — Telephone Encounter (Signed)
I called and advised that he needs to walk for now and wait till he comes out of brace and we will discuss therapy at that time.

## 2017-01-04 ENCOUNTER — Ambulatory Visit (INDEPENDENT_AMBULATORY_CARE_PROVIDER_SITE_OTHER): Payer: 59

## 2017-01-04 ENCOUNTER — Encounter (INDEPENDENT_AMBULATORY_CARE_PROVIDER_SITE_OTHER): Payer: Self-pay | Admitting: Specialist

## 2017-01-04 ENCOUNTER — Ambulatory Visit (INDEPENDENT_AMBULATORY_CARE_PROVIDER_SITE_OTHER): Payer: 59 | Admitting: Specialist

## 2017-01-04 ENCOUNTER — Telehealth (INDEPENDENT_AMBULATORY_CARE_PROVIDER_SITE_OTHER): Payer: Self-pay | Admitting: Specialist

## 2017-01-04 VITALS — BP 118/77 | HR 59 | Ht 69.0 in | Wt 180.0 lb

## 2017-01-04 DIAGNOSIS — Z981 Arthrodesis status: Secondary | ICD-10-CM

## 2017-01-04 DIAGNOSIS — M47812 Spondylosis without myelopathy or radiculopathy, cervical region: Secondary | ICD-10-CM | POA: Diagnosis not present

## 2017-01-04 MED ORDER — GABAPENTIN 100 MG PO CAPS: 100.0000 mg | ORAL_CAPSULE | Freq: Three times a day (TID) | ORAL | 3 refills | Status: DC

## 2017-01-04 MED ORDER — METHYLPREDNISOLONE 4 MG PO TBPK: ORAL_TABLET | ORAL | 0 refills | Status: DC

## 2017-01-04 NOTE — Patient Instructions (Addendum)
Avoid overhead lifting and overhead use of the arms. Do not lift greater than 5 lbs. Adjust head rest in vehicle to prevent hyperextension if rear ended. Take extra precautions to avoid falling Avoid frequent bending and stooping  No lifting greater than 10 lbs. May use ice or moist heat for pain. Weight loss is of benefit. Handicap license is approved. Try gabapentin  100 mg at night for nerve pain  Start PT for your neck  Medrol dose pak to decrease joint pain in the neck and inflamation  of the joints and nerves.

## 2017-01-04 NOTE — Progress Notes (Signed)
Office Visit Note   Patient: Dwayne Acosta           Date of Birth: 1955-04-18           MRN: [id number] Visit Date: 01/04/2017              Requested by: [place] [address] [place], [place] [address] PCP: Sharilyn Sites, MD   Assessment & Plan: Visit Diagnoses:  1. S/P lumbar spinal fusion   2. Spondylosis without myelopathy or radiculopathy, cervical region     Plan: Avoid overhead lifting and overhead use of the arms. Do not lift greater than 5 lbs. Adjust head rest in vehicle to prevent hyperextension if rear ended. Take extra precautions to avoid falling Avoid frequent bending and stooping  No lifting greater than 10 lbs. May use ice or moist heat for pain. Weight loss is of benefit. Handicap license is approved. Try gabapentin  100 mg at night for nerve pain  Start PT for your neck  Medrol dose pak to decrease joint pain in the neck and inflamation  of the joints and nerves. Follow-Up Instructions: Return in about 4 weeks (around 02/01/2017).   Orders:  Orders Placed This Encounter  Procedures  . XR Lumbar Spine 2-3 Views   No orders of the defined types were placed in this encounter.     Procedures: No procedures performed   Clinical Data: No additional findings.   Subjective: Chief Complaint  Patient presents with  . Lower Back - Routine Post Op    61 year old male with 1 month history of extension of lumbar fusion to L4-5 from L5-S1. He is experiencing neck pain and pain with ROM and posterior occipital pain. He is not taking any narcotics at this point. No arm pain numbness or weakness. Does have pain associated with the left L-S junciton and left upper buttock and occasional numbness and discomfort over the right anterior shin. No bowel or bladder difficulty.     Review of Systems  Constitutional: Negative.   HENT: Negative.   Eyes: Negative.   Respiratory: Negative.   Cardiovascular: Negative.     Gastrointestinal: Negative.   Endocrine: Negative.   Genitourinary: Negative.   Musculoskeletal: Negative.   Skin: Negative.   Allergic/Immunologic: Negative.   Neurological: Negative.   Hematological: Negative.   Psychiatric/Behavioral: Negative.      Objective: Vital Signs: BP 118/77 (BP Location: Left Arm, Patient Position: Sitting)   Pulse (!) 59   Ht 5\' 9"  (1.753 m)   Wt 180 lb (81.6 kg)   BMI 26.58 kg/m   Physical Exam  Constitutional: He is oriented to person, place, and time. He appears well-developed and well-nourished.  HENT:  Head: Normocephalic and atraumatic.  Eyes: Pupils are equal, round, and reactive to light. EOM are normal.  Neck: Normal range of motion. Neck supple.  Pulmonary/Chest: Effort normal and breath sounds normal.  Abdominal: Soft. Bowel sounds are normal.  Neurological: He is alert and oriented to person, place, and time.  Skin: Skin is warm and dry.  Psychiatric: He has a normal mood and affect. His behavior is normal. Judgment and thought content normal.    Back Exam   Tenderness  The patient is experiencing tenderness in the lumbar.  Range of Motion  Extension: abnormal  Flexion: abnormal   Muscle Strength  Right Quadriceps:  5/5  Left Quadriceps:  5/5  Right Hamstrings:  5/5  Left Hamstrings:  5/5   Reflexes  Patellar: Hyporeflexic Achilles: 2/4 Babinski's sign: normal   Other  Toe Walk: normal Heel Walk: normal Sensation: decreased Scars: present  Comments:  Motor is normal, the right knee is 1+ compared wit left 2+. Neck with decreased ROM and tenderness.       Specialty Comments:  No specialty comments available.  Imaging: Xr Lumbar Spine 2-3 Views  Result Date: 01/04/2017 AP and lateral radiographs of the lumbar spine shows the cage and pedicle screws and rods in good position and alignment. No abnormality, solid fusion L5-S1 mild degenerative changes in the lower lumbar levels with mild assymetric disc  narrowing L2-3 and minimal at L3-4 anterior spurs at L3-4 greater than L2-3.    PMFS History: Patient Active Problem List   Diagnosis Date Noted  . Spinal stenosis of lumbar region 12/05/2016    Priority: High    Class: Chronic  . Spinal stenosis of lumbar region with neurogenic claudication 12/05/2016  . Degenerative disc disease, cervical 11/21/2016  . Spondylosis without myelopathy or radiculopathy, cervical region 11/21/2016  . Acute appendicitis 07/20/2016   Past Medical History:  Diagnosis Date  . Anxiety   . Complication of anesthesia    pain med caused bladder,stomach issue-unable to void"stomach swoll up"13 yrs ago note of surgery  . OAB (overactive bladder)   . Overactive bladder     Family History  Problem Relation Age of Onset  . High blood pressure Mother   . Heart attack Father   . Parkinson's disease Father   . Stroke Paternal Grandmother   . Bone cancer Maternal Grandfather     Past Surgical History:  Procedure Laterality Date  . APPENDECTOMY    . BACK SURGERY  2005  . FOOT SURGERY Left    bunion  . LAPAROSCOPIC APPENDECTOMY N/A 07/20/2016   Procedure: APPENDECTOMY LAPAROSCOPIC;  Surgeon: Armandina Gemma, MD;  Location: [facility];  Service: General;  Laterality: N/A;  . LUMBAR FUSION  12/05/2016  . ROTATOR CUFF REPAIR Bilateral    Social History   Occupational History  .  [employer]   Social History Main Topics  . Smoking status: Never Smoker  . Smokeless tobacco: Never Used  . Alcohol use 0.0 oz/week     Comment: occ-gin or vodka 1-2 week  . Drug use: No  . Sexual activity: Not on file

## 2017-01-12 DIAGNOSIS — J209 Acute bronchitis, unspecified: Secondary | ICD-10-CM | POA: Diagnosis not present

## 2017-01-15 ENCOUNTER — Encounter (INDEPENDENT_AMBULATORY_CARE_PROVIDER_SITE_OTHER): Payer: Self-pay | Admitting: Specialist

## 2017-01-15 ENCOUNTER — Ambulatory Visit (INDEPENDENT_AMBULATORY_CARE_PROVIDER_SITE_OTHER): Payer: 59 | Admitting: Specialist

## 2017-01-15 VITALS — BP 116/79 | HR 72 | Ht 69.0 in | Wt 180.0 lb

## 2017-01-15 DIAGNOSIS — N320 Bladder-neck obstruction: Secondary | ICD-10-CM | POA: Diagnosis not present

## 2017-01-15 DIAGNOSIS — Z981 Arthrodesis status: Secondary | ICD-10-CM

## 2017-01-15 DIAGNOSIS — E291 Testicular hypofunction: Secondary | ICD-10-CM | POA: Diagnosis not present

## 2017-01-15 DIAGNOSIS — R350 Frequency of micturition: Secondary | ICD-10-CM | POA: Diagnosis not present

## 2017-01-15 NOTE — Progress Notes (Signed)
   Post-Op Visit Note   Patient: Dwayne Acosta           Date of Birth: 01-01-1956           MRN: [id number] Visit Date: 01/15/2017 PCP: Sharilyn Sites, MD   Assessment & Plan:5  Weeks post extension of fusion to the L4-5 level with decompression, Cervical spondylosis.   Chief Complaint:  Chief Complaint  Patient presents with  . Lower Back - Follow-up, Routine Post Op   Visit Diagnoses: No diagnosis found. SLR is negative bilateral. Right thigh pain and leg pain is intermittant and into the right lateral and posterior calf.   Plan:Avoid frequent bending and stooping  No lifting greater than 10 lbs. May use ice or moist heat for pain. Use brace when out of bed.  Weight loss is of benefit. Handicap license is approved.    Follow-Up Instructions: No Follow-up on file.   Orders:  No orders of the defined types were placed in this encounter.  No orders of the defined types were placed in this encounter.   Imaging: No results found.  PMFS History: Patient Active Problem List   Diagnosis Date Noted  . Spinal stenosis of lumbar region 12/05/2016    Priority: High    Class: Chronic  . Spinal stenosis of lumbar region with neurogenic claudication 12/05/2016  . Degenerative disc disease, cervical 11/21/2016  . Spondylosis without myelopathy or radiculopathy, cervical region 11/21/2016  . Acute appendicitis 07/20/2016   Past Medical History:  Diagnosis Date  . Anxiety   . Complication of anesthesia    pain med caused bladder,stomach issue-unable to void"stomach swoll up"13 yrs ago note of surgery  . OAB (overactive bladder)   . Overactive bladder     Family History  Problem Relation Age of Onset  . High blood pressure Mother   . Heart attack Father   . Parkinson's disease Father   . Stroke Paternal Grandmother   . Bone cancer Maternal Grandfather     Past Surgical History:  Procedure Laterality Date  . APPENDECTOMY    . BACK SURGERY  2005  . FOOT SURGERY Left     bunion  . LUMBAR FUSION  12/05/2016  . ROTATOR CUFF REPAIR Bilateral    Social History   Occupational History    Employer: [employer]  Tobacco Use  . Smoking status: Never Smoker  . Smokeless tobacco: Never Used  Substance and Sexual Activity  . Alcohol use: Yes    Alcohol/week: 0.0 oz    Comment: occ-gin or vodka 1-2 week  . Drug use: No  . Sexual activity: Not on file

## 2017-01-15 NOTE — Patient Instructions (Signed)
Plan:Avoid frequent bending and stooping  No lifting greater than 10 lbs. May use ice or moist heat for pain. Weight loss is of benefit. Handicap license is approved.

## 2017-01-19 ENCOUNTER — Other Ambulatory Visit (INDEPENDENT_AMBULATORY_CARE_PROVIDER_SITE_OTHER): Payer: Self-pay | Admitting: Surgery

## 2017-01-22 NOTE — Telephone Encounter (Signed)
[facility] refill request

## 2017-01-22 NOTE — Telephone Encounter (Signed)
DR. Louanne Skye patient

## 2017-01-29 ENCOUNTER — Telehealth (INDEPENDENT_AMBULATORY_CARE_PROVIDER_SITE_OTHER): Payer: Self-pay | Admitting: Specialist

## 2017-01-29 NOTE — Telephone Encounter (Signed)
Records refaxed to [facility] as were initially faxed 01/19/2107

## 2017-01-30 ENCOUNTER — Telehealth (INDEPENDENT_AMBULATORY_CARE_PROVIDER_SITE_OTHER): Payer: Self-pay | Admitting: Specialist

## 2017-01-30 NOTE — Telephone Encounter (Signed)
Patient called asking about his return to work papers, also wanted to be on the wait list for Dr. Louanne Skye if theres any opening for this week. He states he's needing the note back by the end of this week if possible, since he will be returning to work next Tuesday. CB # [id number]

## 2017-01-31 ENCOUNTER — Encounter (INDEPENDENT_AMBULATORY_CARE_PROVIDER_SITE_OTHER): Payer: Self-pay | Admitting: Radiology

## 2017-01-31 NOTE — Telephone Encounter (Signed)
Pt is aware that the paper work has been completed and I will put it at the front desk for him to pick up tomorrow when he comes in for his appt with Jeneen Rinks

## 2017-02-01 ENCOUNTER — Ambulatory Visit (INDEPENDENT_AMBULATORY_CARE_PROVIDER_SITE_OTHER): Payer: 59 | Admitting: Surgery

## 2017-02-01 ENCOUNTER — Ambulatory Visit (INDEPENDENT_AMBULATORY_CARE_PROVIDER_SITE_OTHER): Payer: 59

## 2017-02-01 ENCOUNTER — Encounter (INDEPENDENT_AMBULATORY_CARE_PROVIDER_SITE_OTHER): Payer: Self-pay | Admitting: Surgery

## 2017-02-01 DIAGNOSIS — G8929 Other chronic pain: Secondary | ICD-10-CM

## 2017-02-01 DIAGNOSIS — M545 Low back pain: Secondary | ICD-10-CM

## 2017-02-01 NOTE — Progress Notes (Signed)
Office Visit Note   Patient: Dwayne Acosta           Date of Birth: 07-01-1955           MRN: [id number] Visit Date: 02/01/2017              Requested by: Sharilyn Sites, [address] [place], [place] [address] PCP: Sharilyn Sites, MD   Assessment & Plan: Visit Diagnoses:  1. Chronic bilateral low back pain without sciatica     Plan: Patient will follow up in 4 weeks for recheck. Advised him that he needs to back off on some of the activity that he is doing. He was planning on going back to work next week and I advised him that he should probably wait until at least 12 weeks postop. He is insisting on trying to return. If he has issues with this he will let me know. Must continue wearing his brace.  Follow-Up Instructions: Return in about 4 weeks (around 03/01/2017) for dr Louanne Skye .   Orders:  Orders Placed This Encounter  Procedures  . XR Lumbar Spine 2-3 Views   No orders of the defined types were placed in this encounter.     Procedures: No procedures performed   Clinical Data: No additional findings.   Subjective: No chief complaint on file.   HPI Patient comes in today stating that he has concerns that his hardware is loose. He admits to continuing to do increased activity [place]. He was recently given a prednisone taper due to his increased activity and had a flareup of back pain. Has had a couple of episodes of pain radiating down his right leg. Patient has been complaining of increased urinary frequency and urgency. He was seen by his urologist 01/15/2017. it was documented that this has been ongoing for years.   Review of Systems  no current cardiac pulmonary issues  Objective: Vital Signs: There were no vitals taken for this visit.  Physical Exam  Constitutional: He is oriented to person, place, and time. No distress.  HENT:  Head: Normocephalic and atraumatic.  Eyes: EOM are normal. Pupils are equal, round, and reactive to light.    Pulmonary/Chest: No respiratory distress.  Musculoskeletal:  Gait is normal.  Neurological: He is alert and oriented to person, place, and time.  Skin: Skin is warm and dry.    Ortho Exam  Specialty Comments:  No specialty comments available.  Imaging: Xr Lumbar Spine 2-3 Views  Result Date: 02/01/2017 X-rays lumbar spine AP lateral views show that his hardware is intact. Unchanged from previous films. No complicating features.    PMFS History: Patient Active Problem List   Diagnosis Date Noted  . Spinal stenosis of lumbar region 12/05/2016    Class: Chronic  . Spinal stenosis of lumbar region with neurogenic claudication 12/05/2016  . Degenerative disc disease, cervical 11/21/2016  . Spondylosis without myelopathy or radiculopathy, cervical region 11/21/2016  . Acute appendicitis 07/20/2016   Past Medical History:  Diagnosis Date  . Anxiety   . Complication of anesthesia    pain med caused bladder,stomach issue-unable to void"stomach swoll up"13 yrs ago note of surgery  . OAB (overactive bladder)   . Overactive bladder     Family History  Problem Relation Age of Onset  . High blood pressure Mother   . Heart attack Father   . Parkinson's disease Father   . Stroke Paternal Grandmother   . Bone cancer Maternal Grandfather     Past  Surgical History:  Procedure Laterality Date  . APPENDECTOMY    . BACK SURGERY  2005  . FOOT SURGERY Left    bunion  . LAPAROSCOPIC APPENDECTOMY N/A 07/20/2016   Procedure: APPENDECTOMY LAPAROSCOPIC;  Surgeon: Armandina Gemma, MD;  Location: [facility];  Service: General;  Laterality: N/A;  . LUMBAR FUSION  12/05/2016  . ROTATOR CUFF REPAIR Bilateral    Social History   Occupational History    Employer: [employer]  Tobacco Use  . Smoking status: Never Smoker  . Smokeless tobacco: Never Used  Substance and Sexual Activity  . Alcohol use: Yes    Alcohol/week: 0.0 oz    Comment: occ-gin or vodka 1-2 week  . Drug use: No  . Sexual  activity: Not on file

## 2017-02-02 ENCOUNTER — Ambulatory Visit (HOSPITAL_COMMUNITY): Payer: 59 | Attending: Specialist

## 2017-02-02 ENCOUNTER — Encounter (HOSPITAL_COMMUNITY): Payer: Self-pay

## 2017-02-02 DIAGNOSIS — M6281 Muscle weakness (generalized): Secondary | ICD-10-CM | POA: Diagnosis not present

## 2017-02-02 DIAGNOSIS — M542 Cervicalgia: Secondary | ICD-10-CM | POA: Insufficient documentation

## 2017-02-02 NOTE — Therapy (Signed)
[place], [place], [address] Phone: [phone]   Fax:  [phone]  Physical Therapy Evaluation  Patient Details  Name: Dwayne Acosta MRN: [id number] Date of Birth: 1955-10-22 Referring Provider: Basil Dess    Encounter Date: 02/02/2017  PT End of Session - 02/02/17 1702    Visit Number  1    Number of Visits  12    Date for PT Re-Evaluation  02/23/17    Authorization Type  [employer]     Authorization Time Period  02/02/17-03/16/17    Authorization - Visit Number  1    Authorization - Number of Visits  40    PT Start Time  5885    PT Stop Time  0277    PT Time Calculation (min)  46 min    Activity Tolerance  Patient tolerated treatment well    Behavior During Therapy  [facility] for tasks assessed/performed       Past Medical History:  Diagnosis Date  . Anxiety   . Complication of anesthesia    pain med caused bladder,stomach issue-unable to void"stomach swoll up"13 yrs ago note of surgery  . OAB (overactive bladder)   . Overactive bladder     Past Surgical History:  Procedure Laterality Date  . APPENDECTOMY    . BACK SURGERY  2005  . FOOT SURGERY Left    bunion  . LAPAROSCOPIC APPENDECTOMY N/A 07/20/2016   Procedure: APPENDECTOMY LAPAROSCOPIC;  Surgeon: Armandina Gemma, MD;  Location: [facility];  Service: General;  Laterality: N/A;  . LUMBAR FUSION  12/05/2016  . ROTATOR CUFF REPAIR Bilateral     There were no vitals filed for this visit.   Subjective Assessment - 02/02/17 1524    Subjective  Dwayne Acosta is a 61yo black male who reports 1 year history of insidious neck stiffness and difficulty with head turns while driving. Pt reports difficulty with neck pain at night while sleeping.     Pertinent History  October 1st, 2018 Lumbar spine fushion/dura repain, hardware removal.     Currently in Pain?  No/denies    Pain Score  10-Worst pain ever         [facility] PT Assessment - 02/02/17 0001      Assessment    Medical Diagnosis  Cervical spondylosis     Referring Provider  Basil Dess     Onset Date/Surgical Date  -- About 1 year    Hand Dominance  Right    Next MD Visit  Pt unsure    Prior Therapy  none      Precautions   Precautions  Back    Precaution Comments  no more than 10-15 lbs    Required Braces or Orthoses  Spinal Brace 3-4 months post op;       Balance Screen   Has the patient fallen in the past 6 months  No    Has the patient had a decrease in activity level because of a fear of falling?   No    Is the patient reluctant to leave their home because of a fear of falling?   No      Prior Function   Level of Independence  Independent    Vocation  Part time employment soem work from home, returns to work in December    Vocation Requirements  computer work, data analysis seated      Observation/Other Assessments   Focus on Therapeutic Outcomes ([place])   60 (40%  impaired)       ROM / Strength   AROM / PROM / Strength  AROM;Strength      AROM   AROM Assessment Site  Cervical    Cervical Flexion  53 posterio neck pain at 40 degrees    Cervical Extension  50 pain free    Cervical - Right Side Bend  28    Cervical - Left Side Bend  34    Cervical - Right Rotation  48    Cervical - Left Rotation  55      Strength   Overall Strength Comments  Resisted cervical isometrics  weak extension/retraction             Objective measurements completed on examination: See above findings.      [facility] Adult PT Treatment/Exercise - 02/02/17 0001      Exercises   Exercises  Neck      Neck Exercises: Supine   Neck Retraction  15 reps;5 secs 2 pillows    Cervical Rotation  Both;5 reps overpressure stretch with hand      Manual Therapy   Manual Therapy  Myofascial release    Myofascial Release  suboccipitals, cervical extensors x 56minutes       Trigger Point Dry Needling - 02/02/17 1658    Consent Given?  Yes    Education Handout Provided  Yes    Muscles Treated Upper  Body  Oblique capitus;Suboccipitals muscle group    Oblique Capitus Response  Twitch response elicited;Palpable increased muscle length    SubOccipitals Response  Twitch response elicited;Palpable increased muscle length           PT Education - 02/02/17 1701    Education provided  Yes    Education Details  role of dry needling in therapy goals    Person(s) Educated  Patient    Methods  Explanation;Demonstration    Comprehension  Verbalized understanding;Returned demonstration;Need further instruction       PT Short Term Goals - 02/02/17 1712      PT SHORT TERM GOAL #1   Title  After 3 weeks patient will demonstrat improved neck rotation ROM >15 degrees bilat.     Status  New      PT SHORT TERM GOAL #2   Title  After 3 weeks patient will demonstrate independence in HEP for stretching and postural strengthening.     Status  New        PT Long Term Goals - 02/02/17 1714      PT LONG TERM GOAL #1   Title  After 6 weeks patient will demonstrate 75 degrees neck rotation bilat, 30 degrees sidebending bilat.     Status  New      PT LONG TERM GOAL #2   Title  After 6 week spatient will report no limitations with headturns while driving.     Status  New      PT LONG TERM GOAL #3   Title  After 6 weeks patient will demonstrate independence in advanced HEP to address chronic postural limitations and weaknesses to prevent recurrence of symptonms.     Status  New             Plan - 02/02/17 1704    Clinical Impression Statement  Pt presenting with moderate postural deviation in the cervical spine with excessive lodotic curvature as well as increased forward head posture. His ROM is limited by more than 35% in cervical rotation and lateral flexion. Individual testing  of PA glides demonstrate hypomobility and soreness at C3-C5. Pt denies any changes to sensation, or referral outside of the neck. Pain is minimal to none, only a factor during end-range movements or sleep at  night. Performed trigger point dry needling to suboccipital group this session with noted significant tissue tightness and release, followed by myofascial relase to this area and all cervical extensor group. Pt responded with significant subjectiv eimprovements in general mobility in neck and improved rotation ROM.     History and Personal Factors relevant to plan of care:  Recent Lumbar fusion, still under restrictions.     Clinical Presentation  Stable    Clinical Presentation due to:  Consistency of limitations without significant irritability to activity.     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 Review HEP. FU on response to dry needling.     PT Home Exercise Plan  Cervical rotation stretch, retraction+tuck into pillow    Consulted and Agree with Plan of Care  Patient       Patient will benefit from skilled therapeutic intervention in order to improve the following deficits and impairments:  Hypomobility  Visit Diagnosis: Cervicalgia - Plan: PT plan of care cert/re-cert  Muscle weakness (generalized) - Plan: PT plan of care cert/re-cert     Problem List Patient Active Problem List   Diagnosis Date Noted  . Spinal stenosis of lumbar region 12/05/2016    Class: Chronic  . Spinal stenosis of lumbar region with neurogenic claudication 12/05/2016  . Degenerative disc disease, cervical 11/21/2016  . Spondylosis without myelopathy or radiculopathy, cervical region 11/21/2016  . Acute appendicitis 07/20/2016   5:20 PM, 02/02/17 Etta Grandchild, PT, DPT Physical Therapist at [place] [phone] (office)        Etta Grandchild 02/02/2017, 5:20 PM  [facility] [address] [place], [place], [address] Phone: [phone]   Fax:  [phone]  Name: Joedy Eickhoff MRN: [id number] Date of Birth: 1956-02-12

## 2017-02-02 NOTE — Patient Instructions (Signed)

## 2017-02-05 ENCOUNTER — Ambulatory Visit (HOSPITAL_COMMUNITY): Payer: 59 | Attending: Specialist | Admitting: Physical Therapy

## 2017-02-05 DIAGNOSIS — M6281 Muscle weakness (generalized): Secondary | ICD-10-CM | POA: Diagnosis not present

## 2017-02-05 DIAGNOSIS — M542 Cervicalgia: Secondary | ICD-10-CM | POA: Diagnosis not present

## 2017-02-05 NOTE — Therapy (Signed)
[place], [place], [address] Phone: [phone]   Fax:  [phone]  Physical Therapy Treatment  Patient Details  Name: Dwayne Acosta MRN: [id number] Date of Birth: [birth year] Referring Provider: Basil Dess    Encounter Date: 02/05/2017  PT End of Session - 02/05/17 1602    Visit Number  2    Number of Visits  12    Date for PT Re-Evaluation  02/23/17    Authorization Type  U[employer]     Authorization Time Period  02/02/17-03/16/17    Authorization - Visit Number  2    Authorization - Number of Visits  40    PT Start Time  1513    PT Stop Time  1555    PT Time Calculation (min)  42 min    Activity Tolerance  Patient tolerated treatment well    Behavior During Therapy  [facility] for tasks assessed/performed       Past Medical History:  Diagnosis Date  . Anxiety   . Complication of anesthesia    pain med caused bladder,stomach issue-unable to void"stomach swoll up"13 yrs ago note of surgery  . OAB (overactive bladder)   . Overactive bladder     Past Surgical History:  Procedure Laterality Date  . APPENDECTOMY    . BACK SURGERY  2005  . FOOT SURGERY Left    bunion  . LAPAROSCOPIC APPENDECTOMY N/A 07/20/2016   Procedure: APPENDECTOMY LAPAROSCOPIC;  Surgeon: Armandina Gemma, MD;  Location: [facility];  Service: General;  Laterality: N/A;  . LUMBAR FUSION  12/05/2016  . ROTATOR CUFF REPAIR Bilateral     There were no vitals filed for this visit.  Subjective Assessment - 02/05/17 1508    Subjective  Dwayne Acosta states that he has no pain he just has limited mobility. He noted increased motion with the dry needling.     Pertinent History  October 1st, 2018 Lumbar spine fushion/dura repain, hardware removal.     Currently in Pain?  No/denies                      [facility] Adult PT Treatment/Exercise - 02/05/17 0001      Exercises   Exercises  Neck      Neck Exercises: Seated   Cervical Isometrics   Extension;Right lateral flexion;Left lateral flexion;3 secs;5 reps    Neck Retraction  10 reps    W Back  10 reps    Other Seated Exercise  scapular retraction x 10       Neck Exercises: Supine   Cervical Isometrics  Extension;Right lateral flexion;Left lateral flexion;3 secs;10 reps    Shoulder Flexion  10 reps      Manual Therapy   Manual Therapy  Joint mobilization;Soft tissue mobilization;Manual Traction    Joint Mobilization  Grade II,III                PT Short Term Goals - 02/02/17 1712      PT SHORT TERM GOAL #1   Title  After 3 weeks patient will demonstrat improved neck rotation ROM >15 degrees bilat.     Status  New      PT SHORT TERM GOAL #2   Title  After 3 weeks patient will demonstrate independence in HEP for stretching and postural strengthening.     Status  New        PT Long Term Goals - 02/02/17 1714      PT  LONG TERM GOAL #1   Title  After 6 weeks patient will demonstrate 75 degrees neck rotation bilat, 30 degrees sidebending bilat.     Status  New      PT LONG TERM GOAL #2   Title  After 6 week spatient will report no limitations with headturns while driving.     Status  New      PT LONG TERM GOAL #3   Title  After 6 weeks patient will demonstrate independence in advanced HEP to address chronic postural limitations and weaknesses to prevent recurrence of symptonms.     Status  New            Plan - 02/05/17 1603    Clinical Impression Statement  Reviewed evaluation and goals with patient.  Pt ROM has already improved.  Pt contiues to demonstrate poor posture.  Therapist emphasised the importance of good posture for both cervical and lumbar overall health.  Added manual traction and jt mobilization to program.      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     PT Home Exercise Plan  Cervical rotation stretch, retraction+tuck into pillow    Consulted and Agree with Plan of Care  Patient       Patient will benefit from skilled therapeutic intervention in order to improve the following deficits and impairments:  Hypomobility  Visit Diagnosis: Cervicalgia  Muscle weakness (generalized)     Problem List Patient Active Problem List   Diagnosis Date Noted  . Spinal stenosis of lumbar region 12/05/2016    Class: Chronic  . Spinal stenosis of lumbar region with neurogenic claudication 12/05/2016  . Degenerative disc disease, cervical 11/21/2016  . Spondylosis without myelopathy or radiculopathy, cervical region 11/21/2016  . Acute appendicitis 07/20/2016  Rayetta Humphrey, PT CLT [id number] 02/05/2017, 4:09 PM  [facility] [address] [place], [place], [address] Phone: [phone]   Fax:  [phone]  Name: Dwayne Acosta MRN: [id number] Date of Birth: [birth year]

## 2017-02-09 ENCOUNTER — Ambulatory Visit (HOSPITAL_COMMUNITY): Payer: 59

## 2017-02-09 DIAGNOSIS — M542 Cervicalgia: Secondary | ICD-10-CM | POA: Diagnosis not present

## 2017-02-09 DIAGNOSIS — M6281 Muscle weakness (generalized): Secondary | ICD-10-CM

## 2017-02-09 NOTE — Therapy (Addendum)
[place], [place], [address] Phone: [phone]   Fax:  [phone]  Physical Therapy Treatment  Patient Details  Name: Dwayne Acosta MRN: [id number] Date of Birth: 1956-02-11 Referring Provider: Basil Dess    Encounter Date: 02/09/2017  PT End of Session - 02/09/17 1608    Visit Number  3    Number of Visits  12    Date for PT Re-Evaluation  02/23/17    Authorization Type  UHC     Authorization Time Period  02/02/17-03/16/17    Authorization - Visit Number  3    Authorization - Number of Visits  40    PT Start Time  1525    PT Stop Time  1610    PT Time Calculation (min)  45 min    Activity Tolerance  Patient tolerated treatment well;No increased pain    Behavior During Therapy  WFL for tasks assessed/performed       Past Medical History:  Diagnosis Date  . Anxiety   . Complication of anesthesia    pain med caused bladder,stomach issue-unable to void"stomach swoll up"13 yrs ago note of surgery  . OAB (overactive bladder)   . Overactive bladder     Past Surgical History:  Procedure Laterality Date  . APPENDECTOMY    . BACK SURGERY  2005  . FOOT SURGERY Left    bunion  . LAPAROSCOPIC APPENDECTOMY N/A 07/20/2016   Procedure: APPENDECTOMY LAPAROSCOPIC;  Surgeon: Armandina Gemma, MD;  Location: [facility];  Service: General;  Laterality: N/A;  . LUMBAR FUSION  12/05/2016  . ROTATOR CUFF REPAIR Bilateral     There were no vitals filed for this visit.  Subjective Assessment - 02/09/17 1528    Subjective  Pt reports he continues to pursue frequent AROM in the neck and remains with improved mobility. He has been working on his ROM.     Pertinent History  October 1st, 2018 Lumbar spine fushion/dura repain, hardware removal.     Currently in Pain?  No/denies    Pain Score  -- some soreness in lower back after going to the gym         [facility] Adult PT Treatment/Exercise - 02/09/17               Manual  Therapy    Manual Therapy  Myofascial release;Neural Stretch     Myofascial Release  bilat UT, pronator teres, bilat sub occipital group x41 minnutes     Neural Stretch  Median nn glieds seated: 1x20:  added to HEP            Trigger Point Dry Needling - 02/09/17      Consent Given?  Yes     Education Handout Provided  Yes     Muscles Treated Upper Body  Upper trapezius Prontaor Teres     Upper Trapezius Response  Twitch reponse elicited;Palpable increased muscle length        Exercises     Exercises  Neck          Neck Exercises: Supine    Neck Retraction  15 reps;5 secs 2 pillows     Cervical Rotation  Both;5 reps overpressure stretch with hand          Manual Therapy    Manual Therapy  Myofascial release     Myofascial Release  suboccipitals, cervical extensors x 64minutes        Interventions This Session:   Therapeutic Exercises  1. Capital flexion mobilization: 2x25 bilat 2. AA rotation stretch: 2x30sec 3. Cervical retraction2x 15x3secH, into 2 pillows 4. Standing Shoulder extension: 1x10x3secH, hands clasped with chin tucked (upper thoracic mobilization)   Manual Therapy  1. Trigger point dry needling: rectus capitus posterior major bilat, rectus capitus posterior minor (less than 3 minutes total)  2. Myofascial release: : rectus capitus posterior major bilat, rectus capitus posterior minor (16 minutes)   HEP additions 1. Captital rotation stretch:      PT Short Term Goals - 02/02/17 1712      PT SHORT TERM GOAL #1   Title  After 3 weeks patient will demonstrat improved neck rotation ROM >15 degrees bilat.     Status  New      PT SHORT TERM GOAL #2   Title  After 3 weeks patient will demonstrate independence in HEP for stretching and postural strengthening.     Status  New        PT Long Term Goals - 02/02/17 1714      PT LONG TERM GOAL #1   Title  After 6 weeks patient will demonstrate 75 degrees neck rotation bilat, 30 degrees sidebending  bilat.     Status  New      PT LONG TERM GOAL #2   Title  After 6 week spatient will report no limitations with headturns while driving.     Status  New      PT LONG TERM GOAL #3   Title  After 6 weeks patient will demonstrate independence in advanced HEP to address chronic postural limitations and weaknesses to prevent recurrence of symptonms.     Status  New            Plan - 02/09/17 1609    Clinical Impression Statement  Pt continues to make good progress overall, improved ROM in cervical rotation, but still limited on left side. Manual therapy and mobilization this session to addreess suboccipital tightness and capital hypomobility. Pt reports good respone to treatment with improved ease of mobility and decreased stiffness.     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     PT Home Exercise Plan  Cervical rotation stretch, retraction+tuck into pillow    Consulted and Agree with Plan of Care  Patient       Patient will benefit from skilled therapeutic intervention in order to improve the following deficits and impairments:  Hypomobility  Visit Diagnosis: Cervicalgia  Muscle weakness (generalized)     Problem List Patient Active Problem List   Diagnosis Date Noted  . Spinal stenosis of lumbar region 12/05/2016    Class: Chronic  . Spinal stenosis of lumbar region with neurogenic claudication 12/05/2016  . Degenerative disc disease, cervical 11/21/2016  . Spondylosis without myelopathy or radiculopathy, cervical region 11/21/2016  . Acute appendicitis 07/20/2016   4:15 PM, 02/09/17 Dwayne Acosta, PT, DPT Physical  Therapist at [facility] [phone] (office)      Dwayne Acosta 02/09/2017, [place], [place], [address] Phone: [phone]   Fax:  [phone]  Name: Dwayne Acosta MRN: [id number] Date of Birth: 30-Aug-1955   *addended on 02/16/17 to remove typographical errors unrelated to this patient's care.  11:26 AM, 02/16/17 Dwayne Acosta, PT, DPT Physical Therapist - [facility] [phone] (Office)

## 2017-02-12 ENCOUNTER — Ambulatory Visit (HOSPITAL_COMMUNITY): Payer: 59

## 2017-02-13 ENCOUNTER — Telehealth (INDEPENDENT_AMBULATORY_CARE_PROVIDER_SITE_OTHER): Payer: Self-pay | Admitting: Specialist

## 2017-02-13 NOTE — Telephone Encounter (Signed)
MALLORY FROM [employer] needs to speak to someone in regard to the return to work form filled out by Dr Louanne Skye [phone].  Thanks

## 2017-02-14 ENCOUNTER — Ambulatory Visit (INDEPENDENT_AMBULATORY_CARE_PROVIDER_SITE_OTHER): Payer: 59 | Admitting: Specialist

## 2017-02-14 ENCOUNTER — Encounter (INDEPENDENT_AMBULATORY_CARE_PROVIDER_SITE_OTHER): Payer: Self-pay | Admitting: Specialist

## 2017-02-14 ENCOUNTER — Ambulatory Visit (INDEPENDENT_AMBULATORY_CARE_PROVIDER_SITE_OTHER): Payer: 59

## 2017-02-14 VITALS — BP 133/97 | HR 58 | Ht 69.0 in | Wt 180.0 lb

## 2017-02-14 DIAGNOSIS — M545 Low back pain: Secondary | ICD-10-CM

## 2017-02-14 DIAGNOSIS — G8929 Other chronic pain: Secondary | ICD-10-CM | POA: Diagnosis not present

## 2017-02-14 DIAGNOSIS — Z981 Arthrodesis status: Secondary | ICD-10-CM | POA: Diagnosis not present

## 2017-02-14 MED ORDER — NALOXEGOL OXALATE 25 MG PO TABS: 25.0000 mg | ORAL_TABLET | Freq: Every day | ORAL | 0 refills | Status: DC

## 2017-02-14 NOTE — Progress Notes (Signed)
   Post-Op Visit Note   Patient: Dwayne Acosta           Date of Birth: 10-Dec-1955           MRN: [id number] Visit Date: 02/14/2017 PCP: Sharilyn Sites, MD   Assessment & Plan:2 months and nearly 2 weeks post op extension of fusion to L4-5  Chief Complaint:  Chief Complaint  Patient presents with  . Lower Back - Follow-up   Visit Diagnoses:  1. Chronic bilateral low back pain without sciatica   2. S/P lumbar spinal fusion   SLR is positive for neurotension right leg.  Motor in both legs is normal  He has intermittant right anteriolateral calf pain. In an L4 or L5  Plan: Avoid frequent bending and stooping  No lifting greater than 10 lbs. May use ice or moist heat for pain. Weight loss is of benefit. Handicap license is approved. Patient indicates that his job place will not allow return to work with any restrictions. From my stand point this patient then is permanently unable to return to his previous job type. I recommend apply SSDI and long term disability. I do not think he will be able to return to gainful  employment on a full time basis.    Follow-Up Instructions: No Follow-up on file.   Orders:  Orders Placed This Encounter  Procedures  . XR Lumbar Spine 2-3 Views   No orders of the defined types were placed in this encounter.   Imaging: No results found.  PMFS History: Patient Active Problem List   Diagnosis Date Noted  . Spinal stenosis of lumbar region 12/05/2016    Priority: High    Class: Chronic  . Spinal stenosis of lumbar region with neurogenic claudication 12/05/2016  . Degenerative disc disease, cervical 11/21/2016  . Spondylosis without myelopathy or radiculopathy, cervical region 11/21/2016  . Acute appendicitis 07/20/2016   Past Medical History:  Diagnosis Date  . Anxiety   . Complication of anesthesia    pain med caused bladder,stomach issue-unable to void"stomach swoll up"13 yrs ago note of surgery  . OAB (overactive bladder)   .  Overactive bladder     Family History  Problem Relation Age of Onset  . High blood pressure Mother   . Heart attack Father   . Parkinson's disease Father   . Stroke Paternal Grandmother   . Bone cancer Maternal Grandfather     Past Surgical History:  Procedure Laterality Date  . APPENDECTOMY    . BACK SURGERY  2005  . FOOT SURGERY Left    bunion  . LAPAROSCOPIC APPENDECTOMY N/A 07/20/2016   Procedure: APPENDECTOMY LAPAROSCOPIC;  Surgeon: Armandina Gemma, MD;  Location: [facility];  Service: General;  Laterality: N/A;  . LUMBAR FUSION  12/05/2016  . ROTATOR CUFF REPAIR Bilateral    Social History   Occupational History    Employer: [employer]  Tobacco Use  . Smoking status: Never Smoker  . Smokeless tobacco: Never Used  Substance and Sexual Activity  . Alcohol use: Yes    Alcohol/week: 0.0 oz    Comment: occ-gin or vodka 1-2 week  . Drug use: No  . Sexual activity: Not on file

## 2017-02-14 NOTE — Telephone Encounter (Signed)
Mallory from [employer] called back today to again request to speak to someone in regard to the return to work form that was filled out by Dr. Louanne Skye.  Please call her at [phone].  Thank you.

## 2017-02-14 NOTE — Patient Instructions (Addendum)
Plan: Avoid frequent bending and stooping  No lifting greater than 10 lbs. May use ice or moist heat for pain. Weight loss is of benefit. Handicap license is approved. Patient indicates that his job place will not allow return to work with any restrictions. From my stand point this patient then is permanently unable to return to his previous job type. I recommend apply SSDI and long term disability. I do not think he will be able to return to gainful  employment on a full time basis.

## 2017-02-16 ENCOUNTER — Ambulatory Visit (HOSPITAL_COMMUNITY): Payer: 59

## 2017-02-16 ENCOUNTER — Telehealth (HOSPITAL_COMMUNITY): Payer: Self-pay | Admitting: Family Medicine

## 2017-02-16 DIAGNOSIS — H6121 Impacted cerumen, right ear: Secondary | ICD-10-CM | POA: Diagnosis not present

## 2017-02-16 NOTE — Telephone Encounter (Signed)
02/16/17  per patient -- he wanted to come in earlier but when I offered 2:30 then he wanted to see [place]al [place] but he had no availability then pt said he would just be here [place]

## 2017-02-16 NOTE — Telephone Encounter (Signed)
I called and lmom for Dwayne Acosta that they need to use the form that was completed on 01/31/2017 with correct restrictions.  And Disregard the one on 02/07/2017. And if she has any questions to please call me back.

## 2017-02-19 ENCOUNTER — Ambulatory Visit (HOSPITAL_COMMUNITY): Payer: 59

## 2017-02-19 ENCOUNTER — Encounter (HOSPITAL_COMMUNITY): Payer: Self-pay

## 2017-02-19 ENCOUNTER — Telehealth (INDEPENDENT_AMBULATORY_CARE_PROVIDER_SITE_OTHER): Payer: Self-pay | Admitting: Radiology

## 2017-02-19 DIAGNOSIS — M542 Cervicalgia: Secondary | ICD-10-CM

## 2017-02-19 DIAGNOSIS — M6281 Muscle weakness (generalized): Secondary | ICD-10-CM

## 2017-02-19 NOTE — Therapy (Signed)
[place], [place], [address] Phone: [phone]   Fax:  [phone]  Physical Therapy Treatment/Re-Assessment  Patient Details  Name: Dwayne Acosta MRN: [id number] Date of Birth: [birth year] Referring Provider: Basil Dess    Encounter Date: 02/19/2017  PT End of Session - 02/19/17 1648    Visit Number  4    Number of Visits  12    Date for PT Re-Evaluation  03/14/17    Authorization Type  UHC     Authorization Time Period  02/02/17-03/16/17    Authorization - Visit Number  4    Authorization - Number of Visits  40    PT Start Time  6073    PT Stop Time  1600    PT Time Calculation (min)  45 min    Activity Tolerance  Patient tolerated treatment well;No increased pain    Behavior During Therapy  WFL for tasks assessed/performed       Past Medical History:  Diagnosis Date  . Anxiety   . Complication of anesthesia    pain med caused bladder,stomach issue-unable to void"stomach swoll up"13 yrs ago note of surgery  . OAB (overactive bladder)   . Overactive bladder     Past Surgical History:  Procedure Laterality Date  . APPENDECTOMY    . BACK SURGERY  2005  . FOOT SURGERY Left    bunion  . LAPAROSCOPIC APPENDECTOMY N/A 07/20/2016   Procedure: APPENDECTOMY LAPAROSCOPIC;  Surgeon: Armandina Gemma, MD;  Location: [facility];  Service: General;  Laterality: N/A;  . LUMBAR FUSION  12/05/2016  . ROTATOR CUFF REPAIR Bilateral     There were no vitals filed for this visit.  Subjective Assessment - 02/19/17 1518    Subjective  Pt reports that he is still having difficulty rotating his head to the left but the right has improved. He notes that he is improving but notices the rotation left the most when merging onto the highway.     Currently in Pain?  No/denies         [facility] PT Assessment - 02/19/17 0001      Assessment   Medical Diagnosis  Cervical spondylosis     Next MD Visit  March      Observation/Other  Assessments   Focus on Therapeutic Outcomes (FOTO)   47% impaired       AROM   Cervical Flexion  53    Cervical Extension  50    Cervical - Right Side Bend  45    Cervical - Left Side Bend  40    Cervical - Right Rotation  60 increased effort at end range    Cervical - Left Rotation  55 Required effort       Strength   Overall Strength Comments  Restisted isometric cervical all directions able to maintain position with slight decreased endurance flexion                   [facility] Adult PT Treatment/Exercise - 02/19/17 0001      Neck Exercises: Seated   Neck Retraction  15 reps    W Back  10 reps    Other Seated Exercise  scapular retraction x 10       Neck Exercises: Supine   Neck Retraction  15 reps;5 secs    Capital Flexion  10 reps with retraction, 3s hold    Cervical Rotation  Both;5 reps      Manual Therapy  Manual Therapy  Joint mobilization;Soft tissue mobilization;Manual Traction    Manual therapy comments  Completed separate from the rest of treatment     Joint Mobilization  Grade II, III; light overpressure flexion and side bend bilaterally OA/AA    Soft tissue mobilization  upglide stretch and downglide stretch x 2 each segment bilaterally    Myofascial Release  suboccipital release             PT Education - 02/19/17 1646    Education provided  Yes    Education Details  Educated patient on significance of current lumbar precautions and gave alternatives to bendings: modification to shoe placement on high surface to avoid reaching to floor, if drops something keep one leg straight and back straight with brace on modifed RDL to continue protection of back. Eduated patient on chin tuck technique     Person(s) Educated  Patient    Methods  Explanation    Comprehension  Verbalized understanding       PT Short Term Goals - 02/02/17 1712      PT SHORT TERM GOAL #1   Title  After 3 weeks patient will demonstrat improved neck rotation ROM >15 degrees  bilat.     Status  New      PT SHORT TERM GOAL #2   Title  After 3 weeks patient will demonstrate independence in HEP for stretching and postural strengthening.     Status  New        PT Long Term Goals - 02/02/17 1714      PT LONG TERM GOAL #1   Title  After 6 weeks patient will demonstrate 75 degrees neck rotation bilat, 30 degrees sidebending bilat.     Status  New      PT LONG TERM GOAL #2   Title  After 6 week spatient will report no limitations with headturns while driving.     Status  New      PT LONG TERM GOAL #3   Title  After 6 weeks patient will demonstrate independence in advanced HEP to address chronic postural limitations and weaknesses to prevent recurrence of symptonms.     Status  New            Plan - 02/19/17 1721    Clinical Impression Statement  Reassessment completed this visit with good progress towards STG and LTGs at this time. Overall, patient has made good progress in mobility (improvement lateral flexion and rotation bilaterally by at least 10 degrees) and strength (isometric testing improved ability to maintain position without pain). He continues to present with most difficulty with rotation to the left, increasing his reports of inability to see out of his window driving to merge onto the highway. Patient also presented with decreased muscle endurance of the cervical flexors with manual isometric testing and visually with supine deep neck flexor strength exercises. Patient had slight improvement in mobility supine compared to seated position indicating some muscle restriction and potentially decreased mobility due to lumbar brace. Overall, patient has made good progress, however, would benefit from further strengthening and soft tissue work to improve cervical mobility. Begin postural t-band exercises next session, therapist ran out of time this session.    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 Review deep neck flexor strengthening     PT Home Exercise Plan  Cervical rotation stretch, retraction+tuck into pillow    Consulted and Agree with Plan of Care  Patient       Patient will benefit from skilled therapeutic intervention in order to improve the following deficits and impairments:  Hypomobility  Visit Diagnosis: Cervicalgia  Muscle weakness (generalized)     Problem List Patient Active Problem List   Diagnosis Date Noted  . Spinal stenosis of lumbar region 12/05/2016    Class: Chronic  . Spinal stenosis of lumbar region with neurogenic claudication 12/05/2016  . Degenerative disc disease, cervical 11/21/2016  . Spondylosis without myelopathy or radiculopathy, cervical region 11/21/2016  . Acute appendicitis 07/20/2016   Starr [place] PT, DPT 5:47 PM, 02/19/17 [place], [place], [address] Phone: [phone]   Fax:  [phone]  Name: Dameon Soltis MRN: [id number] Date of Birth: 03/07/1955

## 2017-02-19 NOTE — Telephone Encounter (Signed)
Mallory called, from [place], and needs clarification on work note.  Please call her to discuss.  Thanks.

## 2017-02-21 ENCOUNTER — Telehealth (INDEPENDENT_AMBULATORY_CARE_PROVIDER_SITE_OTHER): Payer: Self-pay | Admitting: Radiology

## 2017-02-21 DIAGNOSIS — Z0001 Encounter for general adult medical examination with abnormal findings: Secondary | ICD-10-CM | POA: Diagnosis not present

## 2017-02-21 DIAGNOSIS — Z1389 Encounter for screening for other disorder: Secondary | ICD-10-CM | POA: Diagnosis not present

## 2017-02-21 DIAGNOSIS — E291 Testicular hypofunction: Secondary | ICD-10-CM | POA: Diagnosis not present

## 2017-02-21 DIAGNOSIS — E782 Mixed hyperlipidemia: Secondary | ICD-10-CM | POA: Diagnosis not present

## 2017-02-21 NOTE — Telephone Encounter (Signed)
Mallory from [place] called states that patient was given permanent restrictions of -no lifting greater than 15lbs, -no pushing greater than 25 lbs, and - no driving greater that 1hour.  And per his empolyer they can not accommodate these restrictions.  She wants to know if these are in fact life long permanent restrictions or if they will be lifted at some point.  --Please advise So I can call her back.

## 2017-02-21 NOTE — Telephone Encounter (Signed)
I called and lmom that patient was set to return to on 02/06/2017 and work half days for one week then regular duty.  However we saw him back he said when he went back to work he said they couldn't let him work due to new job duties that he has and the restrictions that he has would not allow him to do his job.  --- I advised her to call me back if she has any further questions

## 2017-02-22 ENCOUNTER — Telehealth (INDEPENDENT_AMBULATORY_CARE_PROVIDER_SITE_OTHER): Payer: Self-pay | Admitting: Specialist

## 2017-02-22 NOTE — Telephone Encounter (Signed)
Jarrett Soho -nurse with [employer] called needing clarification on work status for the patient. The number to contact Jarrett Soho is [phone]

## 2017-02-22 NOTE — Telephone Encounter (Signed)
I called and spoke with Jarrett Soho and advised her that they need to follow the restrictions that Dr. Louanne Skye indicated on the form dated 01/31/2017.

## 2017-02-23 ENCOUNTER — Ambulatory Visit (HOSPITAL_COMMUNITY): Payer: 59

## 2017-02-23 DIAGNOSIS — H2513 Age-related nuclear cataract, bilateral: Secondary | ICD-10-CM | POA: Diagnosis not present

## 2017-02-23 DIAGNOSIS — M542 Cervicalgia: Secondary | ICD-10-CM

## 2017-02-23 DIAGNOSIS — H401131 Primary open-angle glaucoma, bilateral, mild stage: Secondary | ICD-10-CM | POA: Diagnosis not present

## 2017-02-23 DIAGNOSIS — M6281 Muscle weakness (generalized): Secondary | ICD-10-CM

## 2017-02-23 NOTE — Therapy (Signed)
[place], [place], [address] Phone: [phone]   Fax:  [phone]  Physical Therapy Treatment  Patient Details  Name: Dwayne Acosta MRN: [id number] Date of Birth: [birth year] Referring Provider: Basil Dess    Encounter Date: 02/23/2017  PT End of Session - 02/23/17 1508    Visit Number  5    Number of Visits  12    Date for PT Re-Evaluation  03/14/17    Authorization Type  UHC     Authorization Time Period  02/02/17-03/16/17    Authorization - Visit Number  5    Authorization - Number of Visits  40    PT Start Time  1401    PT Stop Time  1459    PT Time Calculation (min)  58 min    Activity Tolerance  Patient tolerated treatment well;No increased pain    Behavior During Therapy  WFL for tasks assessed/performed       Past Medical History:  Diagnosis Date  . Anxiety   . Complication of anesthesia    pain med caused bladder,stomach issue-unable to void"stomach swoll up"13 yrs ago note of surgery  . OAB (overactive bladder)   . Overactive bladder     Past Surgical History:  Procedure Laterality Date  . APPENDECTOMY    . BACK SURGERY  2005  . FOOT SURGERY Left    bunion  . LAPAROSCOPIC APPENDECTOMY N/A 07/20/2016   Procedure: APPENDECTOMY LAPAROSCOPIC;  Surgeon: Armandina Gemma, MD;  Location: [facility];  Service: General;  Laterality: N/A;  . LUMBAR FUSION  12/05/2016  . ROTATOR CUFF REPAIR Bilateral     There were no vitals filed for this visit.  Subjective Assessment - 02/23/17 1405    Subjective  Pt comes in early at request of PT d/t cancellations. He arrives directly after an emergent dental procedure to fix a broken crown. His neck is a little sore, mostly d/t sleeping on his stomach last night. He has been working on [employer] consistently.     Pertinent History  October 1st, 2018 Lumbar spine fushion/dura repain, hardware removal.     Currently in Pain?  No/denies    Pain Score  -- some  soreness          [facility] PT Assessment - 02/23/17 0001      ROM / Strength   AROM / PROM / Strength  PROM      AROM   Cervical - Right Rotation  75    Cervical - Left Rotation  72      PROM   PROM Assessment Site  Cervical    Cervical - Right Rotation  80    Cervical - Left Rotation  82        [facility] Adult PT Treatment/Exercise - 02/23/17 0001      Neck Exercises: Supine   Other Supine Exercise  Deep neck flexor isometric holds: 10x5sec, restingbetween shaky, slightly leftlaterally flexed      Manual Therapy   Manual Therapy  Passive ROM    Joint Mobilization  Capital Flexion mobilization: 1x60sec AP c traction    Myofascial Release  upper traps 8 minutes each    Passive ROM  P/ROM Stretching: UT (ear to axilla) c scap depression: 3x30sec Cervical rotation in slight flexion: 3x30sec bilat       Trigger Point Dry Needling - 02/23/17 1502    Consent Given?  Yes    Education Handout Provided  Yes  Muscles Treated Upper Body  Upper trapezius    Upper Trapezius Response  Palpable increased muscle length;Twitch reponse elicited 12 minutes total             PT Short Term Goals - 02/02/17 1712      PT SHORT TERM GOAL #1   Title  After 3 weeks patient will demonstrat improved neck rotation ROM >15 degrees bilat.     Status  New      PT SHORT TERM GOAL #2   Title  After 3 weeks patient will demonstrate independence in HEP for stretching and postural strengthening.     Status  New        PT Long Term Goals - 02/02/17 1714      PT LONG TERM GOAL #1   Title  After 6 weeks patient will demonstrate 75 degrees neck rotation bilat, 30 degrees sidebending bilat.     Status  New      PT LONG TERM GOAL #2   Title  After 6 week spatient will report no limitations with headturns while driving.     Status  New      PT LONG TERM GOAL #3   Title  After 6 weeks patient will demonstrate independence in advanced HEP to address chronic postural limitations and weaknesses to  prevent recurrence of symptonms.     Status  New            Plan - 02/23/17 1509    Clinical Impression Statement  Discussed reassessment and progress thus far. Explained deficits in deep neck flexors, as well as increased burden of neck rotation in driving d/t limited trunk rotation after lumbar spinal surgery. Heavy manual therapy at address upper traps bilat, more knots in the right side. Strong improvements in P/ROM adn A/ROM at end of session. Started deep neck flexor endurance training this session aswell, with good reponse, but will nto add in to HEP until pt is able to demonstrate independent performance in clinic.     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 Teach DNF endurance for home program.     Consulted and Agree with Plan of Care  Patient       Patient will benefit from skilled therapeutic intervention in order to improve the following deficits and impairments:  Hypomobility  Visit Diagnosis: Cervicalgia  Muscle weakness (generalized)     Problem List Patient Active Problem List   Diagnosis Date Noted  . Spinal stenosis of lumbar region 12/05/2016    Class: Chronic  . Spinal stenosis of lumbar region with neurogenic claudication 12/05/2016  . Degenerative disc disease, cervical 11/21/2016  . Spondylosis without myelopathy or radiculopathy, cervical region 11/21/2016  . Acute appendicitis 07/20/2016   3:14 PM, 02/23/17 Etta Grandchild, PT, DPT Physical Therapist at [place] [phone] (office)     Etta Grandchild 02/23/2017, 3:13 PM  [facility] [address] [place], [place], [address] Phone: [phone]   Fax:  [phone]  Name: Soren Lazarz MRN: [id number] Date of Birth: 1955-03-16

## 2017-02-28 ENCOUNTER — Ambulatory Visit (HOSPITAL_COMMUNITY): Payer: 59

## 2017-02-28 ENCOUNTER — Encounter (HOSPITAL_COMMUNITY): Payer: Self-pay

## 2017-02-28 DIAGNOSIS — M6281 Muscle weakness (generalized): Secondary | ICD-10-CM

## 2017-02-28 DIAGNOSIS — M542 Cervicalgia: Secondary | ICD-10-CM

## 2017-02-28 NOTE — Therapy (Signed)
[place], [place], [address] Phone: [phone]   Fax:  [phone]  Physical Therapy Treatment  Patient Details  Name: Dwayne Acosta MRN: [id number] Date of Birth: March 25, 1955 Referring Provider: Basil Dess    Encounter Date: 02/28/2017  PT End of Session - 02/28/17 1604    Visit Number  6    Number of Visits  12    Date for PT Re-Evaluation  03/14/17    Authorization Type  UHC     Authorization Time Period  02/02/17-03/16/17    Authorization - Visit Number  6    Authorization - Number of Visits  40    PT Start Time  8338 pt late for apt    PT Stop Time  1603    PT Time Calculation (min)  25 min    Activity Tolerance  Patient tolerated treatment well;No increased pain    Behavior During Therapy  WFL for tasks assessed/performed       Past Medical History:  Diagnosis Date  . Anxiety   . Complication of anesthesia    pain med caused bladder,stomach issue-unable to void"stomach swoll up"13 yrs ago note of surgery  . OAB (overactive bladder)   . Overactive bladder     Past Surgical History:  Procedure Laterality Date  . APPENDECTOMY    . BACK SURGERY  2005  . FOOT SURGERY Left    bunion  . LAPAROSCOPIC APPENDECTOMY N/A 07/20/2016   Procedure: APPENDECTOMY LAPAROSCOPIC;  Surgeon: Armandina Gemma, MD;  Location: [facility];  Service: General;  Laterality: N/A;  . LUMBAR FUSION  12/05/2016  . ROTATOR CUFF REPAIR Bilateral     There were no vitals filed for this visit.  Subjective Assessment - 02/28/17 1602    Subjective  Pt arrived with reoprts of significant improvements following last session with passive stretches and the dry needling.  No reports of pain today.  Does reports increase ease with driving since last session.      Pertinent History  October 1st, 2018 Lumbar spine fushion/dura repain, hardware removal.     Currently in Pain?  No/denies          [facility] Adult PT Treatment/Exercise - 02/28/17  0001      Manual Therapy   Manual Therapy  Soft tissue mobilization;Passive ROM;Other (comment)    Manual therapy comments  Completed separate from the rest of treatment     Soft tissue mobilization  Upper traps, scalenes and levator scapula    Passive ROM  PROM rotation    Other Manual Therapy  Upper trap position release techniques               PT Short Term Goals - 02/02/17 1712      PT SHORT TERM GOAL #1   Title  After 3 weeks patient will demonstrat improved neck rotation ROM >15 degrees bilat.     Status  New      PT SHORT TERM GOAL #2   Title  After 3 weeks patient will demonstrate independence in HEP for stretching and postural strengthening.     Status  New        PT Long Term Goals - 02/02/17 1714      PT LONG TERM GOAL #1   Title  After 6 weeks patient will demonstrate 75 degrees neck rotation bilat, 30 degrees sidebending bilat.     Status  New      PT LONG TERM GOAL #2  Title  After 6 week spatient will report no limitations with headturns while driving.     Status  New      PT LONG TERM GOAL #3   Title  After 6 weeks patient will demonstrate independence in advanced HEP to address chronic postural limitations and weaknesses to prevent recurrence of symptonms.     Status  New            Plan - 02/28/17 1605    Clinical Impression Statement  Pt late for apt today, unable to complete full POC.  Session focus on cervical mobility with manual technqiues to improve A/PROM.  Significant improvements noted with cervical rotation following soft tissue mobilizaiton and position release technqiues to Lt upper traps.  Reviewed HEP and encouraged pt to stay hydrated to reduce risk of headache following session.  No reoprts of pain.      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 Teach DNF endurance for home program.     PT Home Exercise Plan  Cervical rotation stretch, retraction+tuck into pillow       Patient will benefit from skilled therapeutic intervention in order to improve the following deficits and impairments:  Hypomobility  Visit Diagnosis: Cervicalgia  Muscle weakness (generalized)     Problem List Patient Active Problem List   Diagnosis Date Noted  . Spinal stenosis of lumbar region 12/05/2016    Class: Chronic  . Spinal stenosis of lumbar region with neurogenic claudication 12/05/2016  . Degenerative disc disease, cervical 11/21/2016  . Spondylosis without myelopathy or radiculopathy, cervical region 11/21/2016  . Acute appendicitis 07/20/2016   Ihor Austin, [place]; [place]  Aldona Lento 02/28/2017, 4:11 PM  [place], [place], [address] Phone: [phone]   Fax:  [phone]  Name: Jakorian Marengo MRN: [id number] Date of Birth: 30-Oct-1955

## 2017-03-01 ENCOUNTER — Telehealth (HOSPITAL_COMMUNITY): Payer: Self-pay

## 2017-03-01 NOTE — Telephone Encounter (Signed)
Pt has funeral to attend out of town and can not come in today

## 2017-03-02 ENCOUNTER — Telehealth (INDEPENDENT_AMBULATORY_CARE_PROVIDER_SITE_OTHER): Payer: Self-pay | Admitting: Specialist

## 2017-03-02 ENCOUNTER — Ambulatory Visit (HOSPITAL_COMMUNITY): Payer: 59

## 2017-03-02 NOTE — Telephone Encounter (Signed)
Patient called, lmvm asking if records been sent for STD claim. I called him back [phone] an lmvm advising records were faxed 12/27. And asked that he let me know if I need to refax

## 2017-03-07 ENCOUNTER — Ambulatory Visit (HOSPITAL_COMMUNITY): Payer: 59 | Attending: Specialist

## 2017-03-07 ENCOUNTER — Encounter (HOSPITAL_COMMUNITY): Payer: Self-pay

## 2017-03-07 DIAGNOSIS — M6281 Muscle weakness (generalized): Secondary | ICD-10-CM | POA: Diagnosis not present

## 2017-03-07 DIAGNOSIS — M542 Cervicalgia: Secondary | ICD-10-CM | POA: Insufficient documentation

## 2017-03-07 NOTE — Therapy (Signed)
[place], [place], [address] Phone: [phone]   Fax:  [phone]  Physical Therapy Treatment  Patient Details  Name: Dwayne Acosta MRN: [id number] Date of Birth: 1956-02-20 Referring Provider: Basil Dess    Encounter Date: 03/07/2017  PT End of Session - 03/07/17 1527    Visit Number  7    Number of Visits  12    Date for PT Re-Evaluation  03/14/17    Authorization Type  UHC     Authorization Time Period  02/02/17-03/16/17    Authorization - Visit Number  7    Authorization - Number of Visits  40    PT Start Time  2025 pt arrived late    PT Stop Time  1558    PT Time Calculation (min)  31 min    Activity Tolerance  Patient tolerated treatment well;No increased pain    Behavior During Therapy  WFL for tasks assessed/performed       Past Medical History:  Diagnosis Date  . Anxiety   . Complication of anesthesia    pain med caused bladder,stomach issue-unable to void"stomach swoll up"13 yrs ago note of surgery  . OAB (overactive bladder)   . Overactive bladder     Past Surgical History:  Procedure Laterality Date  . APPENDECTOMY    . BACK SURGERY  2005  . FOOT SURGERY Left    bunion  . LAPAROSCOPIC APPENDECTOMY N/A 07/20/2016   Procedure: APPENDECTOMY LAPAROSCOPIC;  Surgeon: Armandina Gemma, MD;  Location: [facility];  Service: General;  Laterality: N/A;  . LUMBAR FUSION  12/05/2016  . ROTATOR CUFF REPAIR Bilateral     There were no vitals filed for this visit.  Subjective Assessment - 03/07/17 1528    Subjective  Pt states that he is having about 3-4/10 pain on the R side of his neck this date.     Pertinent History  October 1st, 2018 Lumbar spine fushion/dura repain, hardware removal.     Currently in Pain?  Yes    Pain Score  4     Pain Location  Neck    Pain Orientation  Right    Pain Descriptors / Indicators  Aching             [facility] Adult PT Treatment/Exercise - 03/07/17 0001       Neck Exercises: Machines for Strengthening   UBE (Upper Arm Bike)  x3 mins retro for postural strengthening       Neck Exercises: Supine   Other Supine Exercise  Deep neck flexor isometric holds: 10x5sec, resting between; stage II of DNF progression (cervical retraction + A/AROM cervical flexion with BUE and controlled lower) x10 reps      Manual Therapy   Manual therapy comments  Completed separate from the rest of treatment     Soft tissue mobilization  manual upper trap stretch bil 3x30" each; suboccipital release x8 mins total with sligth traction    Passive ROM  OA rotation 5x15-20" each direction           PT Education - 03/07/17 1530    Education provided  Yes    Education Details  exercise technique    Person(s) Educated  Patient    Methods  Explanation    Comprehension  Verbalized understanding       PT Short Term Goals - 02/02/17 1712      PT SHORT TERM GOAL #1   Title  After 3 weeks patient  will demonstrat improved neck rotation ROM >15 degrees bilat.     Status  New      PT SHORT TERM GOAL #2   Title  After 3 weeks patient will demonstrate independence in HEP for stretching and postural strengthening.     Status  New        PT Long Term Goals - 02/02/17 1714      PT LONG TERM GOAL #1   Title  After 6 weeks patient will demonstrate 75 degrees neck rotation bilat, 30 degrees sidebending bilat.     Status  New      PT LONG TERM GOAL #2   Title  After 6 week spatient will report no limitations with headturns while driving.     Status  New      PT LONG TERM GOAL #3   Title  After 6 weeks patient will demonstrate independence in advanced HEP to address chronic postural limitations and weaknesses to prevent recurrence of symptonms.     Status  New            Plan - 03/07/17 1600    Clinical Impression Statement  Session limited as pt arrived late for appointment. Most of session focused on manual techniques to address soft tissue restrictions in order  to improve cervical AROM. Pt noted wtih L UT tighter than R and also noted to have increased tightness in suboccipitals. Performed passive OA cervical rotation this date and also progressed pt in the DNF endurance program. He required cues for proper technique and reported some burning pain at his posterior neck but stated it was not that bad. Continue POC as planned.    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 Teach DNF endurance for home program. continue OA rotation PROM, consider OA rotation MET next visit    PT Home Exercise Plan  Cervical rotation stretch, retraction+tuck into pillow    Consulted and Agree with Plan of Care  Patient       Patient will benefit from skilled therapeutic intervention in order to improve the following deficits and impairments:  Hypomobility  Visit Diagnosis: Cervicalgia  Muscle weakness (generalized)     Problem List Patient Active Problem List   Diagnosis Date Noted  . Spinal stenosis of lumbar region 12/05/2016    Class: Chronic  . Spinal stenosis of lumbar region with neurogenic claudication 12/05/2016  . Degenerative disc disease, cervical 11/21/2016  . Spondylosis without myelopathy or radiculopathy, cervical region 11/21/2016  . Acute appendicitis 07/20/2016      Geraldine Solar PT, DPT  [facility] [address] [place], [place], [address] Phone: [phone]   Fax:  [phone]  Name: Dwayne Acosta MRN: [id number] Date of Birth: April 01, 1955

## 2017-03-07 NOTE — Telephone Encounter (Signed)
Unfortunately, most insurance companies will not pay for physical therapy that will determine what his true physical capacity is to perform work. My recommendations are as stated. I think that at age 62 with scoliosis, and degenerative  changes occurring due to age, he is at high risk of developing disease above his fusions if he performs medium or heavy work. I can not make his employer give him work at a light duty but his back situation does make  for a cause that he may not be able to go back to his previous job duties.

## 2017-03-09 ENCOUNTER — Ambulatory Visit (HOSPITAL_COMMUNITY): Payer: 59

## 2017-03-09 DIAGNOSIS — M542 Cervicalgia: Secondary | ICD-10-CM | POA: Diagnosis not present

## 2017-03-09 DIAGNOSIS — M6281 Muscle weakness (generalized): Secondary | ICD-10-CM

## 2017-03-09 NOTE — Telephone Encounter (Signed)
I called and lmom and read Dr. Otho Ket response to this message to [facility].  Advised to call me back if she has any further questions

## 2017-03-09 NOTE — Therapy (Signed)
[place], [place], [address] Phone: [phone]   Fax:  [phone]  Physical Therapy Treatment  Patient Details  Name: Dwayne Acosta MRN: [id number] Date of Birth: 26-Mar-1955 Referring Provider: Basil Dess    Encounter Date: 03/09/2017  PT End of Session - 03/09/17 1626    Visit Number  8    Number of Visits  12    Date for PT Re-Evaluation  03/14/17    Authorization Type  UHC     Authorization Time Period  02/02/17-03/16/17    Authorization - Visit Number  8    Authorization - Number of Visits  40    PT Start Time  1532    PT Stop Time  1602    PT Time Calculation (min)  30 min    Activity Tolerance  Patient tolerated treatment well;No increased pain    Behavior During Therapy  WFL for tasks assessed/performed       Past Medical History:  Diagnosis Date  . Anxiety   . Complication of anesthesia    pain med caused bladder,stomach issue-unable to void"stomach swoll up"13 yrs ago note of surgery  . OAB (overactive bladder)   . Overactive bladder     Past Surgical History:  Procedure Laterality Date  . APPENDECTOMY    . BACK SURGERY  2005  . FOOT SURGERY Left    bunion  . LAPAROSCOPIC APPENDECTOMY N/A 07/20/2016   Procedure: APPENDECTOMY LAPAROSCOPIC;  Surgeon: Armandina Gemma, MD;  Location: [facility];  Service: General;  Laterality: N/A;  . LUMBAR FUSION  12/05/2016  . ROTATOR CUFF REPAIR Bilateral     There were no vitals filed for this visit.  Subjective Assessment - 03/09/17 1536    Subjective  Pt reports his neck pain has improved somewhat but he has still has stiffness. Reports good HEP compliance and has included some of the deep neck flexor endurance activity at home too.     Currently in Pain?  No/denies        [facility] Adult PT Treatment/Exercise - 03/09/17 0001      Neck Exercises: Supine   Other Supine Exercise  Supine rotational stretch, slight flexion: 2x30sec (HEP ed)  Barrel Hug middle  trap stretch: 2x30sec (HEP education)       Manual Therapy   Myofascial Release  upper traps release 6 minutes bilat cervical extensors/suboccipitals x6 minutes    Passive ROM  P/ROM: OA rotation 2x30sec bilat; UT stretch 2x30sec bilat       Trigger Point Dry Needling - 03/09/17 1638    Consent Given?  Yes    Education Handout Provided  Yes    Muscles Treated Upper Body  Upper trapezius    Upper Trapezius Response  Palpable increased muscle length;Twitch reponse elicited        PT Short Term Goals - 03/09/17 1631      PT SHORT TERM GOAL #1   Title  After 3 weeks patient will demonstrat improved neck rotation ROM >15 degrees bilat.     Status  Achieved      PT SHORT TERM GOAL #2   Title  After 3 weeks patient will demonstrate independence in HEP for stretching and postural strengthening.     Status  On-going        PT Long Term Goals - 03/09/17 1631      PT LONG TERM GOAL #1   Title  After 6 weeks patient will demonstrate 75 degrees neck  rotation bilat, 30 degrees sidebending bilat.     Status  Partially Met    Target Date  03/16/17      PT LONG TERM GOAL #2   Title  After 6 week spatient will report no limitations with headturns while driving.     Status  Partially Met    Target Date  03/16/17      PT LONG TERM GOAL #3   Title  After 6 weeks patient will demonstrate independence in advanced HEP to address chronic postural limitations and weaknesses to prevent recurrence of symptonms.     Status  On-going            Plan - 03/09/17 1627    Clinical Impression Statement  Pt arrived late, henc esession limited. PTtook time to discuss crren tHEP regimen adn compliance, encouraging patient to be more consistent with daily stretching for improved overall outcomes. Tightness in Upper traps persists bilat, but subjectively moreso on left, and manual therapy used to address this. Additional HEP education for stretches to better target transitional musculature between  upper trap and middle trap. Pt continues to mainting improvements in cervical ROM subjectively and objectively. Making good progress toward goals overall.     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     PT Home Exercise Plan  Cervical rotation stretch, retraction+tuck into pillow; need to add DNF endurance training, barrel hug, and supine cervical rotation stretch.     Consulted and Agree with Plan of Care  Patient       Patient will benefit from skilled therapeutic intervention in order to improve the following deficits and impairments:  Hypomobility  Visit Diagnosis: Cervicalgia  Muscle weakness (generalized)     Problem List Patient Active Problem List   Diagnosis Date Noted  . Spinal stenosis of lumbar region 12/05/2016    Class: Chronic  . Spinal stenosis of lumbar region with neurogenic claudication 12/05/2016  . Degenerative disc disease, cervical 11/21/2016  . Spondylosis without myelopathy or radiculopathy, cervical region 11/21/2016  . Acute appendicitis 07/20/2016   4:43 PM, 03/09/17 Etta Grandchild, PT, DPT Physical Therapist at [place] [phone] (office)     Etta Grandchild 03/09/2017, 4:41 PM  [facility] [address] [place], [place], [address] Phone: [phone]   Fax:  [phone]  Name: Dwayne Acosta MRN: [id number] Date of Birth: 1955/11/03

## 2017-03-12 ENCOUNTER — Ambulatory Visit (HOSPITAL_COMMUNITY): Payer: 59

## 2017-03-12 DIAGNOSIS — M6281 Muscle weakness (generalized): Secondary | ICD-10-CM

## 2017-03-12 DIAGNOSIS — M542 Cervicalgia: Secondary | ICD-10-CM

## 2017-03-12 NOTE — Therapy (Signed)
[place], [place], [address] Phone: [phone]   Fax:  [phone]  Physical Therapy Treatment  Patient Details  Name: Dwayne Acosta MRN: [id number] Date of Birth: [birth year] Referring Provider: Basil Dess   Encounter Date: 03/12/2017  PT End of Session - 03/12/17 1618    Visit Number  9    Number of Visits  12    Date for PT Re-Evaluation  03/14/17    Authorization Type  [employer]     Authorization Time Period  02/02/17-03/16/17    Authorization - Visit Number  9    Authorization - Number of Visits  40    PT Start Time  0321    PT Stop Time  2248    PT Time Calculation (min)  52 min    Activity Tolerance  Patient tolerated treatment well;No increased pain    Behavior During Therapy  WFL for tasks assessed/performed       Past Medical History:  Diagnosis Date  . Anxiety   . Complication of anesthesia    pain med caused bladder,stomach issue-unable to void"stomach swoll up"13 yrs ago note of surgery  . OAB (overactive bladder)   . Overactive bladder     Past Surgical History:  Procedure Laterality Date  . APPENDECTOMY    . BACK SURGERY  2005  . FOOT SURGERY Left    bunion  . LAPAROSCOPIC APPENDECTOMY N/A 07/20/2016   Procedure: APPENDECTOMY LAPAROSCOPIC;  Surgeon: Armandina Gemma, MD;  Location: [facility];  Service: General;  Laterality: N/A;  . LUMBAR FUSION  12/05/2016  . ROTATOR CUFF REPAIR Bilateral     There were no vitals filed for this visit.  Subjective Assessment - 03/12/17 1518    Subjective  Patient report he is doing well, continues to have some intermitent RLE radicular pain, but largely not limiting. Pt reports he tried some HEP items over the weekend with good response overall. Pt reports he is 100% back top normal for functional mobility. He has no limitatiosn in head turns.      Pertinent History  October 1st, 2018 Lumbar spine fushion/dura repain, hardware removal.     Currently in Pain?   Yes    Pain Score  8  right leg radicular pain     Pain Location  Leg    Pain Orientation  Right    Pain Onset  More than a month ago    Pain Frequency  Intermittent         [facility] PT Assessment - 03/12/17 0001      Assessment   Medical Diagnosis  Cervical spondylosis     Referring Provider  Basil Dess    Onset Date/Surgical Date  -- About 1 year    Hand Dominance  Right    Next MD Visit  Pt unsure    Prior Therapy  none      Precautions   Precautions  Back    Precaution Comments  no more than 10-15 lbs    Required Braces or Orthoses  Spinal Brace 3-4 months post op;       Observation/Other Assessments   Focus on Therapeutic Outcomes (FOTO)   92% (8% impaired)       ROM / Strength   AROM / PROM / Strength  PROM      AROM   Cervical Flexion  50 53 at eval    Cervical Extension  58 50 at eval    Cervical - Right  Side Bend  30  28 at eval    Cervical - Left Side Bend  40 34 at eval    Cervical - Right Rotation  76 48 degrees    Cervical - Left Rotation  80 55 degrees at eval      Palpation   Spinal mobility  Continues to demonstrate some funcitonal tightness in AO joint         [facility] Adult PT Treatment/Exercise - 03/12/17 0001      Neck Exercises: Standing   Other Standing Exercises  retraction + rotation suboccipital mobilization: 1x10 bilat HEP education    Other Standing Exercises  Robbery: 1x10 (HEP educaiton)       Neck Exercises: Seated   Postural Training  barrel hug, mid trap stretch: 3x30sec    Other Seated Exercise  deep neck flexor endurance training: 1x105secH    Other Seated Exercise  seated capital rotation stretch 2x30sec bilat HEP education      Neck Exercises: Supine   Neck Retraction  10 reps;3 secs in 2 pillows (HEP education)         PT Education - 03/12/17 1618    Education provided  Yes    Education Details  DC HEP training and education     Person(s) Educated  Patient    Methods  Explanation    Comprehension  Verbalized  understanding;Returned demonstration       PT Short Term Goals - 03/12/17 1622      PT SHORT TERM GOAL #1   Title  After 3 weeks patient will demonstrat improved neck rotation ROM >15 degrees bilat.     Status  Achieved      PT SHORT TERM GOAL #2   Title  After 3 weeks patient will demonstrate independence in HEP for stretching and postural strengthening.     Status  Achieved        PT Long Term Goals - 03/12/17 1622      PT LONG TERM GOAL #1   Title  After 6 weeks patient will demonstrate 75 degrees neck rotation bilat, 30 degrees sidebending bilat.     Status  Achieved      PT LONG TERM GOAL #2   Title  After 6 week spatient will report no limitations with headturns while driving.     Status  Achieved      PT LONG TERM GOAL #3   Title  After 6 weeks patient will demonstrate independence in advanced HEP to address chronic postural limitations and weaknesses to prevent recurrence of symptonms.     Status  Achieved            Plan - 03/12/17 1619    Clinical Impression Statement  Reassessmen tthis session. Pt reports mobility in nekc has returned to baseline. Final HEP is reviewed and performed. Pt requires intermittent VC adn tactile cues for form, but ultimatel ycan perform independently with handout. Pt reports no pain this session. Good progress overall with nearly goals met. Significant improvement in ROM restrictions, albeit some tightness in suboccipitals remain. 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 Spinal stenosis of lumbar region 12/05/2016    Class: Chronic  . Spinal stenosis of lumbar region with neurogenic claudication 12/05/2016  . Degenerative disc disease, cervical 11/21/2016  . Spondylosis without myelopathy or radiculopathy, cervical region 11/21/2016  . Acute appendicitis 07/20/2016   4:24 PM, 03/12/17  Etta Grandchild, PT, DPT Physical Therapist at [facility] [phone] (office)      Etta Grandchild 03/12/2017, 4:23 PM  [place], [place], [address] Phone: [phone]   Fax:  [phone]  Name: Dwayne Acosta MRN: [id number] Date of Birth: [birth year]  PHYSICAL THERAPY DISCHARGE SUMMARY  Visits from Start of Care: 9  Current functional level related to goals / functional outcomes: Pt has returned to functional baseline, no limitations related to neck, and all goals achieved.    Remaining deficits: *see summary above    Education / Equipment: Final HEP for DC reviewed in full with handout issued. Pt performed correctly in session.  Plan: Patient agrees to discharge.  Patient goals were met. Patient is being discharged due to meeting the stated rehab goals.  ?????             4:25 PM, 03/12/17 Etta Grandchild, PT, DPT Physical Therapist at [facility] [phone] (office)

## 2017-03-16 ENCOUNTER — Encounter (HOSPITAL_COMMUNITY): Payer: 59

## 2017-03-16 ENCOUNTER — Other Ambulatory Visit (INDEPENDENT_AMBULATORY_CARE_PROVIDER_SITE_OTHER): Payer: Self-pay | Admitting: Specialist

## 2017-03-19 NOTE — Telephone Encounter (Signed)
Movantik Refill request

## 2017-03-22 ENCOUNTER — Ambulatory Visit: Payer: 59 | Admitting: General Surgery

## 2017-03-30 ENCOUNTER — Ambulatory Visit (INDEPENDENT_AMBULATORY_CARE_PROVIDER_SITE_OTHER): Payer: 59 | Admitting: Specialist

## 2017-04-23 ENCOUNTER — Ambulatory Visit (INDEPENDENT_AMBULATORY_CARE_PROVIDER_SITE_OTHER): Payer: 59

## 2017-04-23 ENCOUNTER — Encounter (INDEPENDENT_AMBULATORY_CARE_PROVIDER_SITE_OTHER): Payer: Self-pay | Admitting: Specialist

## 2017-04-23 ENCOUNTER — Ambulatory Visit (INDEPENDENT_AMBULATORY_CARE_PROVIDER_SITE_OTHER): Payer: 59 | Admitting: Specialist

## 2017-04-23 VITALS — BP 129/84 | HR 48 | Ht 69.0 in | Wt 180.0 lb

## 2017-04-23 DIAGNOSIS — Z981 Arthrodesis status: Secondary | ICD-10-CM | POA: Diagnosis not present

## 2017-04-23 DIAGNOSIS — M4156 Other secondary scoliosis, lumbar region: Secondary | ICD-10-CM | POA: Diagnosis not present

## 2017-04-23 DIAGNOSIS — M47816 Spondylosis without myelopathy or radiculopathy, lumbar region: Secondary | ICD-10-CM

## 2017-04-23 MED ORDER — TIZANIDINE HCL 2 MG PO CAPS: 2.0000 mg | ORAL_CAPSULE | Freq: Three times a day (TID) | ORAL | 1 refills | Status: DC | PRN

## 2017-04-23 MED ORDER — DICLOFENAC SODIUM 50 MG PO TBEC
50.0000 mg | DELAYED_RELEASE_TABLET | Freq: Three times a day (TID) | ORAL | 2 refills | Status: DC
Start: 2017-04-23 — End: 2017-07-14

## 2017-04-23 NOTE — Progress Notes (Signed)
Office Visit Note   Patient: Dwayne Acosta           Date of Birth: [birth year]           MRN: [id number] Visit Date: 04/23/2017              Requested by: Sharilyn Sites, [place] [place], [place] [address] PCP: Sharilyn Sites, MD   Assessment & Plan: Visit Diagnoses:  1. S/P lumbar spinal fusion   2. Other secondary scoliosis, lumbar region     Plan:Avoid frequent bending and stooping  No lifting greater than 10 lbs. May use ice or moist heat for pain. Weight loss is of benefit. Handicap license is approved.  Follow-Up Instructions: Return in about 4 weeks (around 05/21/2017).   Orders:  Orders Placed This Encounter  Procedures  . XR Lumbar Spine 2-3 Views   No orders of the defined types were placed in this encounter.     Procedures: No procedures performed   Clinical Data: No additional findings.   Subjective: Chief Complaint  Patient presents with  . Lower Back - Follow-up    62 year old male  Months post extension of his fusion to the L4-5 level for severe adjacent DDD and foramenal narrowing with listhesis, He has had L4 radiculopathy right side and post operatively. He notices pain into both buttocks bilateral with some numbness and tingling . He is concerned that there may be some loosening of hardware. He has pain with lying on the left side. Bladder is okay but is taking movantik for constipation and it helps. Overall the bowels are functioning better. He is taking alleve 2 twice a day.     Review of Systems  Constitutional: Negative.   HENT: Negative.   Eyes: Negative.   Respiratory: Negative.   Cardiovascular: Negative.   Gastrointestinal: Negative.   Endocrine: Negative.   Genitourinary: Negative.   Musculoskeletal: Negative.   Skin: Negative.   Allergic/Immunologic: Negative.   Neurological: Negative.   Hematological: Negative.   Psychiatric/Behavioral: Negative.      Objective: Vital Signs: BP 129/84 (BP Location: Left  Arm, Patient Position: Sitting)   Pulse (!) 48   Ht 5\' 9"  (1.753 m)   Wt 180 lb (81.6 kg)   BMI 26.58 kg/m   Physical Exam  Constitutional: He is oriented to person, place, and time. He appears well-developed and well-nourished.  HENT:  Head: Normocephalic and atraumatic.  Eyes: EOM are normal. Pupils are equal, round, and reactive to light.  Neck: Normal range of motion. Neck supple.  Pulmonary/Chest: Effort normal and breath sounds normal.  Abdominal: Soft. Bowel sounds are normal.  Musculoskeletal: Normal range of motion.  Neurological: He is alert and oriented to person, place, and time.  Skin: Skin is warm and dry.  Psychiatric: He has a normal mood and affect. His behavior is normal. Judgment and thought content normal.    Back Exam   Tenderness  The patient is experiencing tenderness in the lumbar.      Specialty Comments:  No specialty comments available.  Imaging: Xr Lumbar Spine 2-3 Views  Result Date: 04/23/2017 AP and lateral flexion and extension radiographs of the lumbar spine show no motion across the L4-5 level the pedicle screws and rods are in good position and alignment. He has residual lumbar scoliosis above the fusion  And some right lateral listhesis at the L3-4 level. Radiographs show no motion at the fusion site, no sign of loosening of hardware.  PMFS History: Patient Active Problem List   Diagnosis Date Noted  . Spinal stenosis of lumbar region 12/05/2016    Priority: High    Class: Chronic  . Spinal stenosis of lumbar region with neurogenic claudication 12/05/2016  . Degenerative disc disease, cervical 11/21/2016  . Spondylosis without myelopathy or radiculopathy, cervical region 11/21/2016  . Acute appendicitis 07/20/2016   Past Medical History:  Diagnosis Date  . Anxiety   . Complication of anesthesia    pain med caused bladder,stomach issue-unable to void"stomach swoll up"13 yrs ago note of surgery  . OAB (overactive bladder)     . Overactive bladder     Family History  Problem Relation Age of Onset  . High blood pressure Mother   . Heart attack Father   . Parkinson's disease Father   . Stroke Paternal Grandmother   . Bone cancer Maternal Grandfather     Past Surgical History:  Procedure Laterality Date  . APPENDECTOMY    . BACK SURGERY  2005  . FOOT SURGERY Left    bunion  . LAPAROSCOPIC APPENDECTOMY N/A 07/20/2016   Procedure: APPENDECTOMY LAPAROSCOPIC;  Surgeon: Armandina Gemma, MD;  Location: [facility];  Service: General;  Laterality: N/A;  . LUMBAR FUSION  12/05/2016  . ROTATOR CUFF REPAIR Bilateral    Social History   Occupational History    Employer: [employer]  Tobacco Use  . Smoking status: Never Smoker  . Smokeless tobacco: Never Used  Substance and Sexual Activity  . Alcohol use: Yes    Alcohol/week: 0.0 oz    Comment: occ-gin or vodka 1-2 week  . Drug use: No  . Sexual activity: Not on file

## 2017-04-23 NOTE — Patient Instructions (Signed)
Avoid frequent bending and stooping  No lifting greater than 10 lbs. May use ice or moist heat for pain. Weight loss is of benefit. Handicap license is approved.   

## 2017-05-11 ENCOUNTER — Other Ambulatory Visit (INDEPENDENT_AMBULATORY_CARE_PROVIDER_SITE_OTHER): Payer: Self-pay | Admitting: Specialist

## 2017-05-14 ENCOUNTER — Other Ambulatory Visit (INDEPENDENT_AMBULATORY_CARE_PROVIDER_SITE_OTHER): Payer: Self-pay | Admitting: Specialist

## 2017-05-14 MED ORDER — TIZANIDINE HCL 2 MG PO CAPS: 2.0000 mg | ORAL_CAPSULE | Freq: Three times a day (TID) | ORAL | 1 refills | Status: DC | PRN

## 2017-05-14 NOTE — Telephone Encounter (Signed)
Gabapentin refill request 

## 2017-05-16 ENCOUNTER — Ambulatory Visit (INDEPENDENT_AMBULATORY_CARE_PROVIDER_SITE_OTHER): Payer: 59 | Admitting: Specialist

## 2017-05-16 ENCOUNTER — Encounter (INDEPENDENT_AMBULATORY_CARE_PROVIDER_SITE_OTHER): Payer: Self-pay | Admitting: Specialist

## 2017-05-16 VITALS — BP 131/92 | HR 48

## 2017-05-16 DIAGNOSIS — M4155 Other secondary scoliosis, thoracolumbar region: Secondary | ICD-10-CM | POA: Diagnosis not present

## 2017-05-16 DIAGNOSIS — Z981 Arthrodesis status: Secondary | ICD-10-CM | POA: Diagnosis not present

## 2017-05-16 DIAGNOSIS — M4726 Other spondylosis with radiculopathy, lumbar region: Secondary | ICD-10-CM

## 2017-05-16 NOTE — Progress Notes (Addendum)
Office Visit Note   Patient: Dwayne Acosta           Date of Birth: 03-23-1955           MRN: [id number] Visit Date: 05/16/2017              Requested by: Sharilyn Sites, [address] [place], [place] [address] PCP: Sharilyn Sites, MD   Assessment & Plan: Visit Diagnoses:  1. S/P lumbar spinal fusion   2. Other spondylosis with radiculopathy, lumbar region   3. Other secondary scoliosis, thoracolumbar region     Plan: Avoid frequent bending and stooping  No lifting greater than 10 lbs. May use ice or moist heat for pain. Weight loss is of benefit. Handicap license is approved.  Follow-Up Instructions: Return in about 3 months (around 08/16/2017).   Orders:  No orders of the defined types were placed in this encounter.  No orders of the defined types were placed in this encounter.     Procedures: No procedures performed   Clinical Data: No additional findings.   Subjective: Chief Complaint  Patient presents with  . Lower Back - Pain, Follow-up    62 year old male with history of extension of lumbar fusion to the L4-5 level from L5-S1 due to adjacent DDD and foramenal stenosis. He has a lumbar scoliosis above the  2 level fusion. He has muscle spasm in the right leg and intermittant pain the right leg with bending and twisting. His walking better. No bowel or bladder difficulties. He is on  Movantik for GI difficulty associated with intermittant narcotic use.     Review of Systems  Constitutional: Negative.   HENT: Negative.   Eyes: Negative.   Respiratory: Negative.   Cardiovascular: Negative.   Gastrointestinal: Negative.   Endocrine: Negative.   Genitourinary: Negative.   Musculoskeletal: Negative.   Skin: Negative.   Allergic/Immunologic: Negative.   Neurological: Negative.   Hematological: Negative.   Psychiatric/Behavioral: Negative.      Objective: Vital Signs: BP (!) 131/92   Pulse (!) 48   Physical Exam  Constitutional: He is  oriented to person, place, and time. He appears well-developed and well-nourished.  HENT:  Head: Normocephalic and atraumatic.  Eyes: EOM are normal. Pupils are equal, round, and reactive to light.  Neck: Normal range of motion. Neck supple.  Pulmonary/Chest: Effort normal and breath sounds normal.  Abdominal: Soft. Bowel sounds are normal.  Neurological: He is alert and oriented to person, place, and time.  Skin: Skin is warm and dry.  Psychiatric: He has a normal mood and affect. His behavior is normal. Judgment and thought content normal.    Back Exam   Tenderness  The patient is experiencing tenderness in the lumbar.  Range of Motion  Extension: normal  Flexion:  60 abnormal  Lateral bend right: abnormal  Lateral bend left: abnormal  Rotation right: abnormal  Rotation left: abnormal   Muscle Strength  Right Quadriceps:  5/5  Left Quadriceps:  5/5  Right Hamstrings:  5/5  Left Hamstrings:  5/5   Tests  Straight leg raise right: negative Straight leg raise left: negative  Reflexes  Patellar: 2/4 Achilles: 2/4 Babinski's sign: normal   Other  Toe walk: normal Heel walk: normal Sensation: decreased Gait: normal  Erythema: no back redness Scars: present  Comments:  Forward bending is restricted to 40%.       Specialty Comments:  No specialty comments available.  Imaging: No results found.   PMFS History:  Patient Active Problem List   Diagnosis Date Noted  . Spinal stenosis of lumbar region 12/05/2016    Priority: High    Class: Chronic  . Spinal stenosis of lumbar region with neurogenic claudication 12/05/2016  . Degenerative disc disease, cervical 11/21/2016  . Spondylosis without myelopathy or radiculopathy, cervical region 11/21/2016  . Acute appendicitis 07/20/2016   Past Medical History:  Diagnosis Date  . Anxiety   . Complication of anesthesia    pain med caused bladder,stomach issue-unable to void"stomach swoll up"13 yrs ago note of  surgery  . OAB (overactive bladder)   . Overactive bladder     Family History  Problem Relation Age of Onset  . High blood pressure Mother   . Heart attack Father   . Parkinson's disease Father   . Stroke Paternal Grandmother   . Bone cancer Maternal Grandfather     Past Surgical History:  Procedure Laterality Date  . APPENDECTOMY    . BACK SURGERY  2005  . FOOT SURGERY Left    bunion  . LAPAROSCOPIC APPENDECTOMY N/A 07/20/2016   Procedure: APPENDECTOMY LAPAROSCOPIC;  Surgeon: Armandina Gemma, MD;  Location: [facility];  Service: General;  Laterality: N/A;  . LUMBAR FUSION  12/05/2016  . ROTATOR CUFF REPAIR Bilateral    Social History   Occupational History    Employer: [employer]  Tobacco Use  . Smoking status: Never Smoker  . Smokeless tobacco: Never Used  Substance and Sexual Activity  . Alcohol use: Yes    Alcohol/week: 0.0 oz    Comment: occ-gin or vodka 1-2 week  . Drug use: No  . Sexual activity: Not on file

## 2017-05-16 NOTE — Patient Instructions (Signed)
Avoid frequent bending and stooping  No lifting greater than 10 lbs. May use ice or moist heat for pain. Weight loss is of benefit. Handicap license is approved.   

## 2017-05-18 DIAGNOSIS — D481 Neoplasm of uncertain behavior of connective and other soft tissue: Secondary | ICD-10-CM | POA: Diagnosis not present

## 2017-06-01 DIAGNOSIS — L0291 Cutaneous abscess, unspecified: Secondary | ICD-10-CM | POA: Diagnosis not present

## 2017-06-01 DIAGNOSIS — Z6827 Body mass index (BMI) 27.0-27.9, adult: Secondary | ICD-10-CM | POA: Diagnosis not present

## 2017-06-01 DIAGNOSIS — E663 Overweight: Secondary | ICD-10-CM | POA: Diagnosis not present

## 2017-07-14 ENCOUNTER — Other Ambulatory Visit (INDEPENDENT_AMBULATORY_CARE_PROVIDER_SITE_OTHER): Payer: Self-pay | Admitting: Specialist

## 2017-07-16 ENCOUNTER — Telehealth (INDEPENDENT_AMBULATORY_CARE_PROVIDER_SITE_OTHER): Payer: Self-pay | Admitting: Specialist

## 2017-07-16 NOTE — Telephone Encounter (Signed)
Patient called wanting to let Dr. Louanne Skye know that his leg has been tingling over the last few days, he's currently taking gabapentin and was wondering if this was something to be concerned about. CB # [phone]

## 2017-07-16 NOTE — Telephone Encounter (Signed)
Patient called wanting to let Dr. Louanne Skye know that his leg has been tingling over the last few days, he's currently taking gabapentin and was wondering if this was something to be concerned about

## 2017-07-16 NOTE — Telephone Encounter (Signed)
dicolfenac refill request

## 2017-07-19 ENCOUNTER — Telehealth (INDEPENDENT_AMBULATORY_CARE_PROVIDER_SITE_OTHER): Payer: Self-pay | Admitting: Specialist

## 2017-07-19 ENCOUNTER — Other Ambulatory Visit (INDEPENDENT_AMBULATORY_CARE_PROVIDER_SITE_OTHER): Payer: Self-pay | Admitting: Specialist

## 2017-07-19 NOTE — Telephone Encounter (Signed)
Patient called concerned about the tingling in his leg. I advised message was sent to Dr. Louanne Skye.  The number to contact patient is [phone]

## 2017-07-19 NOTE — Telephone Encounter (Signed)
Patient called concerned about the tingling in his leg. I advised message was sent to Dr. Louanne Skye. ---Patient has a 3 month follow up appt on 08/22/2017.----Please advise

## 2017-07-19 NOTE — Telephone Encounter (Signed)
I spoke with Dwayne Acosta and I reviewed his last xray done 04/2017. If the tingling and numbness is worsening we should  See him back and rexray the lumbar spine. He has the gabapentin he can take up to TID and he said that he had stopped it so he will restart it. See if we can work him into a Thursday clinic when [place] and I are here so he can be seen back sooner than in 5-6 weeks. jen

## 2017-07-20 NOTE — Telephone Encounter (Signed)
Pt has been sched for 07/26/17 @ 2pm, he is aware that he is being worked in and may have to wait.

## 2017-07-26 ENCOUNTER — Encounter (INDEPENDENT_AMBULATORY_CARE_PROVIDER_SITE_OTHER): Payer: Self-pay | Admitting: Specialist

## 2017-07-26 ENCOUNTER — Ambulatory Visit (INDEPENDENT_AMBULATORY_CARE_PROVIDER_SITE_OTHER): Payer: 59 | Admitting: Specialist

## 2017-07-26 VITALS — BP 107/73 | HR 40 | Ht 69.0 in | Wt 180.0 lb

## 2017-07-26 DIAGNOSIS — M4326 Fusion of spine, lumbar region: Secondary | ICD-10-CM

## 2017-07-26 DIAGNOSIS — M5416 Radiculopathy, lumbar region: Secondary | ICD-10-CM | POA: Diagnosis not present

## 2017-07-26 MED ORDER — METHOCARBAMOL 750 MG PO TABS: 750.0000 mg | ORAL_TABLET | Freq: Three times a day (TID) | ORAL | 0 refills | Status: DC | PRN

## 2017-07-26 MED ORDER — GABAPENTIN 100 MG PO CAPS: ORAL_CAPSULE | ORAL | 6 refills | Status: DC

## 2017-07-26 NOTE — Patient Instructions (Signed)
Avoid frequent bending and stooping  No lifting greater than 10 lbs. May use ice or moist heat for pain. Weight loss is of benefit. Handicap license is approved. You have nerve pain in the right leg and it usually is improved with gabapentin. Vitamin B12, B6 and folate and thiamine are helpful with nerve healing and fish or Kreel oil. A TENs unit can some times be of benefit.

## 2017-07-26 NOTE — Progress Notes (Signed)
Office Visit Note   Patient: Dwayne Acosta.           Date of Birth: [birth year]           MRN: [id number] Visit Date: 07/26/2017              Requested by: Sharilyn Sites, [address] [place], [place] [address] PCP: Sharilyn Sites, MD   Assessment & Plan: Visit Diagnoses:  1. Subacute right lumbar radiculopathy   2. Fusion of spine, lumbar region     Plan: Avoid frequent bending and stooping  No lifting greater than 10 lbs. May use ice or moist heat for pain. Weight loss is of benefit. Handicap license is approved. You have nerve pain in the right leg and it usually is improved with gabapentin. Vitamin B12, B6 and folate and thiamine are helpful with nerve healing and fish or Kreel oil. A TENs unit can some times be of benefit  Follow-Up Instructions: Return in about 1 month (around 08/23/2017).   Orders:  No orders of the defined types were placed in this encounter.  No orders of the defined types were placed in this encounter.     Procedures: No procedures performed   Clinical Data: No additional findings.   Subjective: No chief complaint on file.   62 year old male with history of extension of the lumbar fusion from L5-S1 to L4-5. He experiences some numbness and tingling into the right leg. If I over do it the right leg and back pain worsens. The tingling is not that bad a bad bout about 2 weeks ago. Then he had some pain and tingling into the Left leg also. Then pain is into the right leg with a throbbing sensation. Now 7 months post op from the fusion surgery. The pain is such that the tingling Came about 2 weeks ago and lying down then it stopped and then came on a second time and it is with throbbing. He has difficulty lifting any weight. No bowel or bladder difficulty. Not taking a lot of pain meds, he is walking and taking in plenty of vegetables. Gabapentin seems to help.    Review of Systems   Objective: Vital Signs: BP 107/73   Pulse  (!) 40   Ht 5\' 9"  (1.753 m)   Wt 180 lb (81.6 kg)   BMI 26.58 kg/m   Physical Exam  Ortho Exam  Specialty Comments:  No specialty comments available.  Imaging: No results found.   PMFS History: Patient Active Problem List   Diagnosis Date Noted  . Spinal stenosis of lumbar region 12/05/2016    Priority: High    Class: Chronic  . Spinal stenosis of lumbar region with neurogenic claudication 12/05/2016  . Degenerative disc disease, cervical 11/21/2016  . Spondylosis without myelopathy or radiculopathy, cervical region 11/21/2016  . Acute appendicitis 07/20/2016   Past Medical History:  Diagnosis Date  . Anxiety   . Complication of anesthesia    pain med caused bladder,stomach issue-unable to void"stomach swoll up"13 yrs ago note of surgery  . OAB (overactive bladder)   . Overactive bladder     Family History  Problem Relation Age of Onset  . High blood pressure Mother   . Heart attack Father   . Parkinson's disease Father   . Stroke Paternal Grandmother   . Bone cancer Maternal Grandfather     Past Surgical History:  Procedure Laterality Date  . APPENDECTOMY    . BACK SURGERY  2005  .  FOOT SURGERY Left    bunion  . LAPAROSCOPIC APPENDECTOMY N/A 07/20/2016   Procedure: APPENDECTOMY LAPAROSCOPIC;  Surgeon: Armandina Gemma, MD;  Location: [facility];  Service: General;  Laterality: N/A;  . LUMBAR FUSION  12/05/2016  . ROTATOR CUFF REPAIR Bilateral    Social History   Occupational History    Employer: [employer]  Tobacco Use  . Smoking status: Never Smoker  . Smokeless tobacco: Never Used  Substance and Sexual Activity  . Alcohol use: Yes    Alcohol/week: 0.0 oz    Comment: occ-gin or vodka 1-2 week  . Drug use: No  . Sexual activity: Not on file

## 2017-08-13 DIAGNOSIS — S70361A Insect bite (nonvenomous), right thigh, initial encounter: Secondary | ICD-10-CM | POA: Diagnosis not present

## 2017-08-22 ENCOUNTER — Ambulatory Visit (INDEPENDENT_AMBULATORY_CARE_PROVIDER_SITE_OTHER): Payer: 59 | Admitting: Specialist

## 2017-08-31 ENCOUNTER — Other Ambulatory Visit (INDEPENDENT_AMBULATORY_CARE_PROVIDER_SITE_OTHER): Payer: Self-pay | Admitting: Specialist

## 2017-09-03 NOTE — Telephone Encounter (Signed)
MOVANTIK refill request 

## 2017-09-20 ENCOUNTER — Ambulatory Visit (INDEPENDENT_AMBULATORY_CARE_PROVIDER_SITE_OTHER): Payer: 59 | Admitting: Specialist

## 2017-09-20 ENCOUNTER — Encounter (INDEPENDENT_AMBULATORY_CARE_PROVIDER_SITE_OTHER): Payer: Self-pay | Admitting: Specialist

## 2017-09-20 VITALS — BP 120/86 | HR 49 | Ht 69.0 in | Wt 180.0 lb

## 2017-09-20 DIAGNOSIS — M4156 Other secondary scoliosis, lumbar region: Secondary | ICD-10-CM

## 2017-09-20 DIAGNOSIS — M4726 Other spondylosis with radiculopathy, lumbar region: Secondary | ICD-10-CM

## 2017-09-20 DIAGNOSIS — Z981 Arthrodesis status: Secondary | ICD-10-CM | POA: Diagnosis not present

## 2017-09-20 MED ORDER — GABAPENTIN 100 MG PO CAPS: ORAL_CAPSULE | ORAL | 6 refills | Status: DC

## 2017-09-20 MED ORDER — METHOCARBAMOL 750 MG PO TABS: 750.0000 mg | ORAL_TABLET | Freq: Three times a day (TID) | ORAL | 0 refills | Status: DC | PRN

## 2017-09-20 MED ORDER — DICLOFENAC SODIUM 50 MG PO TBEC: 50.0000 mg | DELAYED_RELEASE_TABLET | Freq: Three times a day (TID) | ORAL | 6 refills | Status: DC

## 2017-09-20 MED ORDER — TRAMADOL HCL 50 MG PO TABS: ORAL_TABLET | ORAL | 0 refills | Status: DC

## 2017-09-20 NOTE — Patient Instructions (Signed)
Avoid frequent bending and stooping  No lifting greater than 10 lbs. May use ice or moist heat for pain. Weight loss is of benefit. Handicap license is approved.   

## 2017-09-20 NOTE — Progress Notes (Signed)
Office Visit Note   Patient: Dwayne Acosta.           Date of Birth: 1955-07-13           MRN: [id number] Visit Date: 09/20/2017              Requested by: Sharilyn Sites, [address] [place], [place] [address] PCP: Sharilyn Sites, MD   Assessment & Plan: Visit Diagnoses:  1. S/P lumbar spinal fusion   2. Other spondylosis with radiculopathy, lumbar region   3. Other secondary scoliosis, lumbar region     Plan: Avoid frequent bending and stooping  No lifting greater than 10 lbs. May use ice or moist heat for pain. Weight loss is of benefit. Handicap license is approved.  Follow-Up Instructions: No follow-ups on file.   Orders:  No orders of the defined types were placed in this encounter.  No orders of the defined types were placed in this encounter.     Procedures: No procedures performed   Clinical Data: No additional findings.   Subjective: Chief Complaint  Patient presents with  . Lower Back - Follow-up    62 year old male with history of lumbar fusion extension to the L4-S1 level from L5-S1 for adjacent stenosis and assymetric disc collapse. Fusion  Extension was nine months ago and he is no longer using a brace. He has intermittant right anterior leg numbness and nerve pain. When the right leg nerve pain is worse he will decrease his activity and lie down. He can not sleep on his stomach without worsening the right leg and back pain.  Bladder he is taking vesicare for an over active bladder up 1-2 x per night. Takes movanik if he is taking pain meds and if he is not getting enough Fiber vegetables in his diet. He is able to ambulate with occasionally limping on the right leg. Sometime with pain into the right great toe. It feels like  A tendon into the right great toe. He walks slower. He is not able to keep up with his 55 year old daughter.    Review of Systems  Constitutional: Negative.   HENT: Negative.   Eyes: Negative.   Respiratory:  Negative.   Cardiovascular: Negative.   Gastrointestinal: Negative.   Endocrine: Negative.   Genitourinary: Negative.   Musculoskeletal: Negative.   Skin: Negative.   Allergic/Immunologic: Negative.   Neurological: Negative.   Hematological: Negative.   Psychiatric/Behavioral: Negative.      Objective: Vital Signs: BP 120/86 (BP Location: Left Arm, Patient Position: Sitting)   Pulse (!) 49   Ht 5\' 9"  (1.753 m)   Wt 180 lb (81.6 kg)   BMI 26.58 kg/m   Physical Exam  Constitutional: He is oriented to person, place, and time. He appears well-developed and well-nourished.  HENT:  Head: Normocephalic and atraumatic.  Eyes: Pupils are equal, round, and reactive to light. EOM are normal.  Neck: Normal range of motion. Neck supple.  Pulmonary/Chest: Effort normal and breath sounds normal.  Abdominal: Soft. Bowel sounds are normal.  Neurological: He is alert and oriented to person, place, and time.  Skin: Skin is warm and dry.  Psychiatric: He has a normal mood and affect. His behavior is normal. Judgment and thought content normal.    Back Exam   Tenderness  The patient is experiencing tenderness in the lumbar.  Range of Motion  Extension: abnormal  Flexion:  60 abnormal  Lateral bend right: abnormal  Lateral bend left:  abnormal  Rotation right: abnormal  Rotation left: abnormal   Muscle Strength  Right Quadriceps:  5/5  Left Quadriceps:  5/5  Right Hamstrings:  5/5  Left Hamstrings:  5/5   Tests  Straight leg raise right: negative Straight leg raise left: negative  Other  Toe walk: normal Heel walk: normal Sensation: normal Gait: antalgic   Comments:  Right knee reflex 1+ left is 2+ ankle reflex 2+/2+,  Hamstring tightness bilateral. Motor without focal deficit. Pattern of right leg neuropathic pain is L4.       Specialty Comments:  No specialty comments available.  Imaging: No results found.   PMFS History: Patient Active Problem List    Diagnosis Date Noted  . Spinal stenosis of lumbar region 12/05/2016    Priority: High    Class: Chronic  . Spinal stenosis of lumbar region with neurogenic claudication 12/05/2016  . Degenerative disc disease, cervical 11/21/2016  . Spondylosis without myelopathy or radiculopathy, cervical region 11/21/2016  . Acute appendicitis 07/20/2016   Past Medical History:  Diagnosis Date  . Anxiety   . Complication of anesthesia    pain med caused bladder,stomach issue-unable to void"stomach swoll up"13 yrs ago note of surgery  . OAB (overactive bladder)   . Overactive bladder     Family History  Problem Relation Age of Onset  . High blood pressure Mother   . Heart attack Father   . Parkinson's disease Father   . Stroke Paternal Grandmother   . Bone cancer Maternal Grandfather     Past Surgical History:  Procedure Laterality Date  . APPENDECTOMY    . BACK SURGERY  2005  . FOOT SURGERY Left    bunion  . LAPAROSCOPIC APPENDECTOMY N/A 07/20/2016   Procedure: APPENDECTOMY LAPAROSCOPIC;  Surgeon: Armandina Gemma, MD;  Location: [facility];  Service: General;  Laterality: N/A;  . LUMBAR FUSION  12/05/2016  . ROTATOR CUFF REPAIR Bilateral    Social History   Occupational History    Employer: [employer]  Tobacco Use  . Smoking status: Never Smoker  . Smokeless tobacco: Never Used  Substance and Sexual Activity  . Alcohol use: Yes    Alcohol/week: 0.0 oz    Comment: occ-gin or vodka 1-2 week  . Drug use: No  . Sexual activity: Not on file

## 2017-09-24 DIAGNOSIS — H401131 Primary open-angle glaucoma, bilateral, mild stage: Secondary | ICD-10-CM | POA: Diagnosis not present

## 2017-10-08 ENCOUNTER — Other Ambulatory Visit (INDEPENDENT_AMBULATORY_CARE_PROVIDER_SITE_OTHER): Payer: Self-pay | Admitting: Specialist

## 2017-10-08 NOTE — Telephone Encounter (Signed)
Methocarbamol refill request, Can you please advise?

## 2017-11-28 DIAGNOSIS — H6091 Unspecified otitis externa, right ear: Secondary | ICD-10-CM | POA: Diagnosis not present

## 2017-11-28 DIAGNOSIS — Z6827 Body mass index (BMI) 27.0-27.9, adult: Secondary | ICD-10-CM | POA: Diagnosis not present

## 2017-11-28 DIAGNOSIS — E663 Overweight: Secondary | ICD-10-CM | POA: Diagnosis not present

## 2017-11-28 DIAGNOSIS — Z1389 Encounter for screening for other disorder: Secondary | ICD-10-CM | POA: Diagnosis not present

## 2017-12-04 DIAGNOSIS — H00015 Hordeolum externum left lower eyelid: Secondary | ICD-10-CM | POA: Diagnosis not present

## 2017-12-04 DIAGNOSIS — H00014 Hordeolum externum left upper eyelid: Secondary | ICD-10-CM | POA: Diagnosis not present

## 2018-01-10 ENCOUNTER — Other Ambulatory Visit (INDEPENDENT_AMBULATORY_CARE_PROVIDER_SITE_OTHER): Payer: Self-pay | Admitting: Specialist

## 2018-01-11 NOTE — Telephone Encounter (Signed)
MOVANTIK refill request

## 2018-01-23 ENCOUNTER — Ambulatory Visit (INDEPENDENT_AMBULATORY_CARE_PROVIDER_SITE_OTHER): Payer: 59 | Admitting: Specialist

## 2018-01-23 ENCOUNTER — Encounter (INDEPENDENT_AMBULATORY_CARE_PROVIDER_SITE_OTHER): Payer: Self-pay | Admitting: Specialist

## 2018-01-23 ENCOUNTER — Ambulatory Visit (INDEPENDENT_AMBULATORY_CARE_PROVIDER_SITE_OTHER): Payer: 59

## 2018-01-23 VITALS — BP 121/85 | HR 52 | Ht 69.0 in | Wt 180.0 lb

## 2018-01-23 DIAGNOSIS — M5416 Radiculopathy, lumbar region: Secondary | ICD-10-CM

## 2018-01-23 DIAGNOSIS — M47816 Spondylosis without myelopathy or radiculopathy, lumbar region: Secondary | ICD-10-CM

## 2018-01-23 DIAGNOSIS — M5136 Other intervertebral disc degeneration, lumbar region: Secondary | ICD-10-CM | POA: Diagnosis not present

## 2018-01-23 DIAGNOSIS — Z981 Arthrodesis status: Secondary | ICD-10-CM | POA: Diagnosis not present

## 2018-01-23 MED ORDER — TRAMADOL HCL 50 MG PO TABS: ORAL_TABLET | ORAL | 0 refills | Status: DC

## 2018-01-23 MED ORDER — DICLOFENAC SODIUM 50 MG PO TBEC
50.0000 mg | DELAYED_RELEASE_TABLET | Freq: Three times a day (TID) | ORAL | 6 refills | Status: DC
Start: 2018-01-23 — End: 2019-05-07

## 2018-01-23 MED ORDER — GABAPENTIN 100 MG PO CAPS: ORAL_CAPSULE | ORAL | 6 refills | Status: DC

## 2018-01-23 NOTE — Patient Instructions (Signed)
Avoid frequent bending and stooping  No lifting greater than 10 lbs. May use ice or moist heat for pain. Weight loss is of benefit. Best medication for lumbar disc disease is arthritis medications like motrin, celebrex, diclofenac and naprosyn. Do not combine these medications as they can have an additive affect of kidney function.  Exercise is important to improve your indurance and does allow people to function better inspite of back pain.

## 2018-01-23 NOTE — Progress Notes (Signed)
Office Visit Note   Patient: Dwayne Acosta.           Date of Birth: 25-Feb-1956           MRN: [id number] Visit Date: 01/23/2018              Requested by: Sharilyn Sites, [address] [place], [place] [address] PCP: Sharilyn Sites, MD   Assessment & Plan: Visit Diagnoses:  1. S/P lumbar spinal fusion   2. Lumbar radiculopathy, right   3. Spondylosis without myelopathy or radiculopathy, lumbar region   4. Degenerative disc disease, lumbar     Plan: Avoid frequent bending and stooping  No lifting greater than 10 lbs. May use ice or moist heat for pain. Weight loss is of benefit. Best medication for lumbar disc disease is arthritis medications like motrin, celebrex, diclofenac and naprosyn. Do not combine these medications as they can have an additive affect of kidney function.  Exercise is important to improve your indurance and does allow people to function better inspite of back pain.  Follow-Up Instructions: Return in about 6 months (around 07/24/2018).   Orders:  Orders Placed This Encounter  Procedures  . XR Lumbar Spine 2-3 Views   Meds ordered this encounter  Medications  . gabapentin (NEURONTIN) 100 MG capsule    Sig: TAKE 1 CAPSULE BY MOUTH THREE TIMES A DAY    Dispense:  30 capsule    Refill:  6  . diclofenac (VOLTAREN) 50 MG EC tablet    Sig: Take 1 tablet (50 mg total) by mouth 3 (three) times daily.    Dispense:  90 tablet    Refill:  6      Procedures: No procedures performed   Clinical Data: No additional findings.   Subjective: Chief Complaint  Patient presents with  . Lower Back - Follow-up    62 year old male post extension of lumbar fusion from L5-S1 to L4-5 for adjacent level DDD and stenosis and grade 1 slip. He has had persistent right lateral calf and thigh pain since then but had severe pain preop in the same distribution. The gabapentin seems to help.  Tizanidine also is of benefit. Lifting greater than10 lbs increases the  right leg pain. He visited his daughter in [place] and while there the prolong standing and walking worsened the pain in the right leg. He feel the nerves in the right anterior leg and lateral leg. He is able to cope with the pain but has not been able to return to his usual job duties. He is walking at least 3 times a week and use a stationary bike. He is avoiding bending and stooping. No bowel or bladder difficulty but does take some narcotic pain meds the ultram This will cause increased constipation and he takes movanik. Ultram has less organ affect.    Review of Systems  Constitutional: Negative.  Negative for activity change, appetite change, chills, diaphoresis, fatigue, fever and unexpected weight change.  HENT: Negative.  Negative for ear pain.   Eyes: Positive for discharge and itching.  Respiratory: Negative.   Cardiovascular: Negative.  Negative for chest pain, palpitations and leg swelling.  Gastrointestinal: Negative.   Endocrine: Negative.  Negative for cold intolerance, heat intolerance, polydipsia, polyphagia and polyuria.  Genitourinary: Negative.  Negative for difficulty urinating, discharge, dysuria, enuresis, flank pain, frequency, genital sores, hematuria, penile pain, penile swelling, scrotal swelling and testicular pain.  Musculoskeletal: Positive for back pain. Negative for arthralgias, gait problem, joint swelling,  myalgias, neck pain and neck stiffness.  Skin: Negative.  Negative for color change, pallor, rash and wound.  Allergic/Immunologic: Negative.  Negative for environmental allergies, food allergies and immunocompromised state.  Neurological: Positive for weakness and numbness. Negative for dizziness, seizures, syncope, facial asymmetry, light-headedness and headaches.  Hematological: Negative.  Negative for adenopathy. Does not bruise/bleed easily.  Psychiatric/Behavioral: Negative.  Negative for agitation, behavioral problems, confusion, decreased concentration,  dysphoric mood, hallucinations, self-injury, sleep disturbance and suicidal ideas. The patient is not nervous/anxious and is not hyperactive.      Objective: Vital Signs: BP 121/85 (BP Location: Left Arm, Patient Position: Sitting)   Pulse (!) 52   Ht 5\' 9"  (1.753 m)   Wt 180 lb (81.6 kg)   BMI 26.58 kg/m   Physical Exam  Back Exam   Tenderness  The patient is experiencing tenderness in the lumbar.  Range of Motion  Extension: abnormal  Flexion: abnormal  Lateral bend right: normal  Lateral bend left: normal  Rotation right: normal  Rotation left: normal   Muscle Strength  Right Quadriceps:  5/5  Left Quadriceps:  5/5  Right Hamstrings:  5/5  Left Hamstrings:  5/5   Tests  Straight leg raise right: negative Straight leg raise left: negative  Reflexes  Patellar: 2/4 Achilles: 2/4 Babinski's sign: normal   Other  Toe walk: normal Heel walk: normal Sensation: normal Erythema: no back redness Scars: absent      Specialty Comments:  No specialty comments available.  Imaging: Xr Lumbar Spine 2-3 Views  Result Date: 01/23/2018 Lumbar interbody fusions L4-5 with expandible cage and L5-S1 with concorde cage, both levels appear healed without motion with flexion and extension. There is a right side scoliosis above the fusion with apex at right L3, there is lateral listhesis of L3 on L4 of 3-4 mm. Adjacent level DDD above L4-S1 fusion with retrolisthesis L3-4 and lateral listhesis. Solid fusion L4-S1.    PMFS History: Patient Active Problem List   Diagnosis Date Noted  . Spinal stenosis of lumbar region 12/05/2016    Priority: High    Class: Chronic  . Spinal stenosis of lumbar region with neurogenic claudication 12/05/2016  . Degenerative disc disease, cervical 11/21/2016  . Spondylosis without myelopathy or radiculopathy, cervical region 11/21/2016  . Acute appendicitis 07/20/2016   Past Medical History:  Diagnosis Date  . Anxiety   . Complication  of anesthesia    pain med caused bladder,stomach issue-unable to void"stomach swoll up"13 yrs ago note of surgery  . OAB (overactive bladder)   . Overactive bladder     Family History  Problem Relation Age of Onset  . High blood pressure Mother   . Heart attack Father   . Parkinson's disease Father   . Stroke Paternal Grandmother   . Bone cancer Maternal Grandfather     Past Surgical History:  Procedure Laterality Date  . APPENDECTOMY    . BACK SURGERY  2005  . FOOT SURGERY Left    bunion  . LAPAROSCOPIC APPENDECTOMY N/A 07/20/2016   Procedure: APPENDECTOMY LAPAROSCOPIC;  Surgeon: Armandina Gemma, MD;  Location: [facility];  Service: General;  Laterality: N/A;  . LUMBAR FUSION  12/05/2016  . ROTATOR CUFF REPAIR Bilateral    Social History   Occupational History    Employer: [employer]  Tobacco Use  . Smoking status: Never Smoker  . Smokeless tobacco: Never Used  Substance and Sexual Activity  . Alcohol use: Yes    Alcohol/week: 0.0 standard drinks  Comment: occ-gin or vodka 1-2 week  . Drug use: No  . Sexual activity: Not on file

## 2018-01-28 DIAGNOSIS — N320 Bladder-neck obstruction: Secondary | ICD-10-CM | POA: Diagnosis not present

## 2018-01-28 DIAGNOSIS — R3915 Urgency of urination: Secondary | ICD-10-CM | POA: Diagnosis not present

## 2018-01-28 DIAGNOSIS — R350 Frequency of micturition: Secondary | ICD-10-CM | POA: Diagnosis not present

## 2018-02-07 DIAGNOSIS — Z1211 Encounter for screening for malignant neoplasm of colon: Secondary | ICD-10-CM | POA: Diagnosis not present

## 2018-02-12 IMAGING — CT CT RENAL STONE PROTOCOL
2 of 3 series · 16 of 46 positions shown, 18 images · non-contrast
Comparison: 07/20/2016

CLINICAL DATA: Left-sided abdominal pain starting yesterday.

EXAM:
CT ABDOMEN AND PELVIS WITHOUT CONTRAST
TECHNIQUE: Multidetector CT imaging of the abdomen and pelvis was performed
following the standard protocol without IV contrast.

[Series 4: lung · axial · 0.74mm/px · z∈[-129,-19]mm · 13 of 65 slices shown, 15 images]
[im 5/65  soft-tissue]
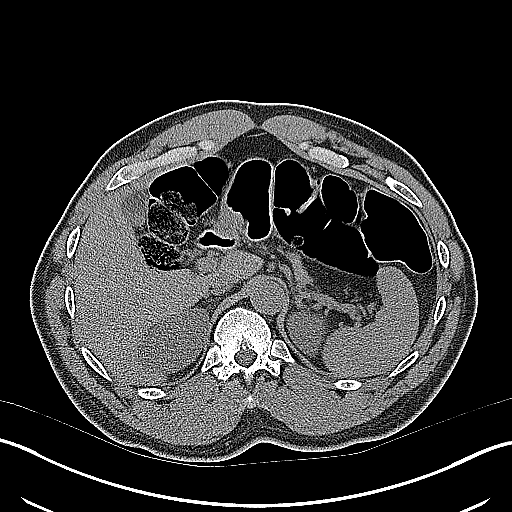
[im 5/65  bone]
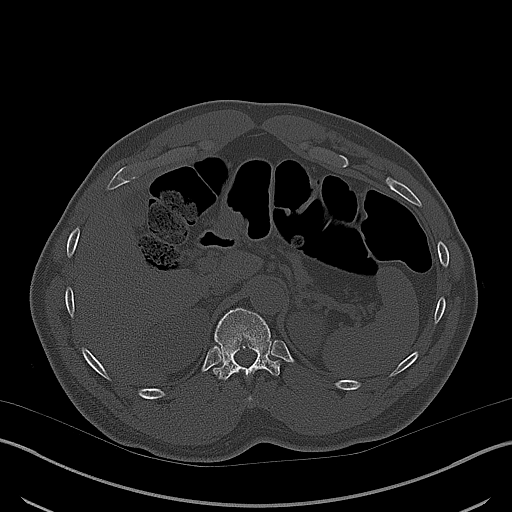
[im 9/65  soft-tissue]
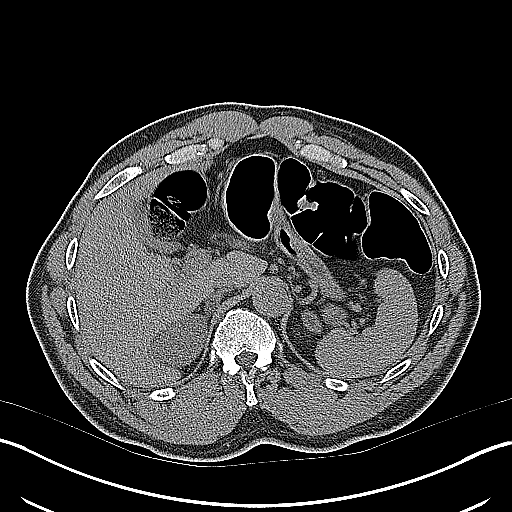
[im 13/65  soft-tissue]
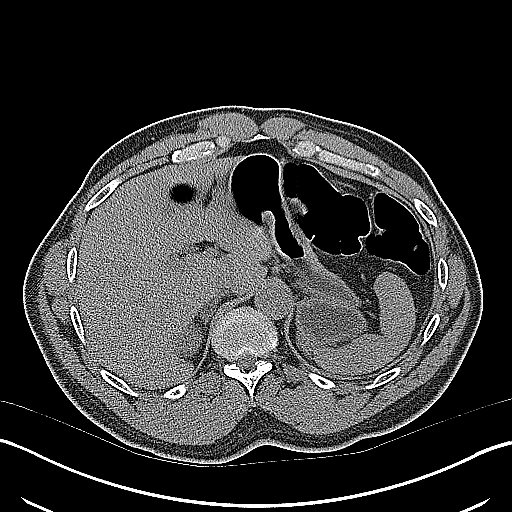
[im 19/65  soft-tissue]
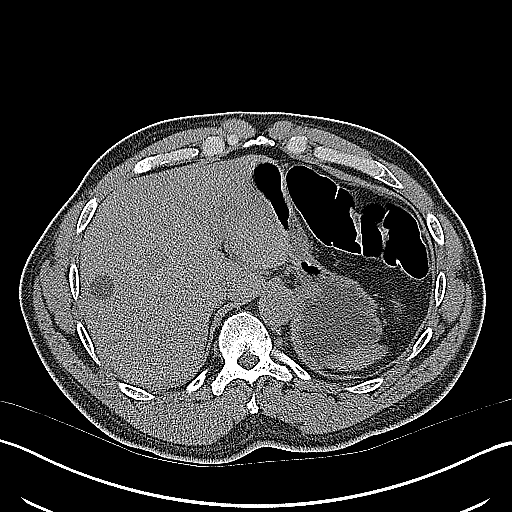
[im 23/65  soft-tissue]
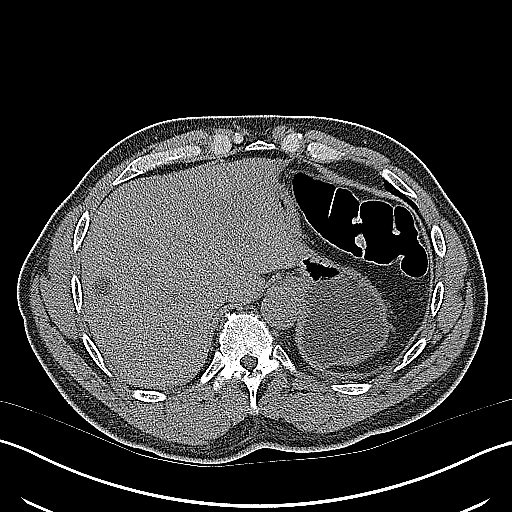
[im 27/65  soft-tissue]
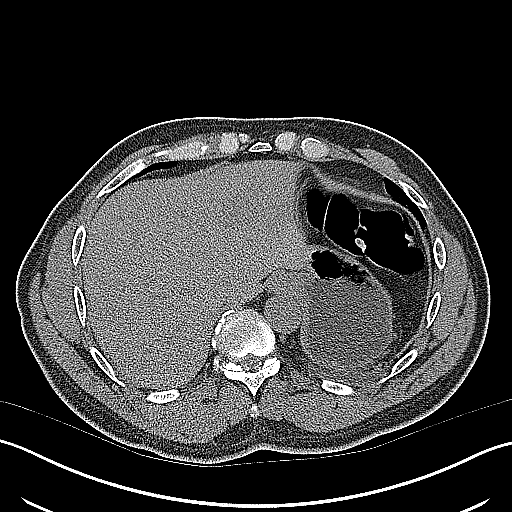
[im 34/65  soft-tissue]
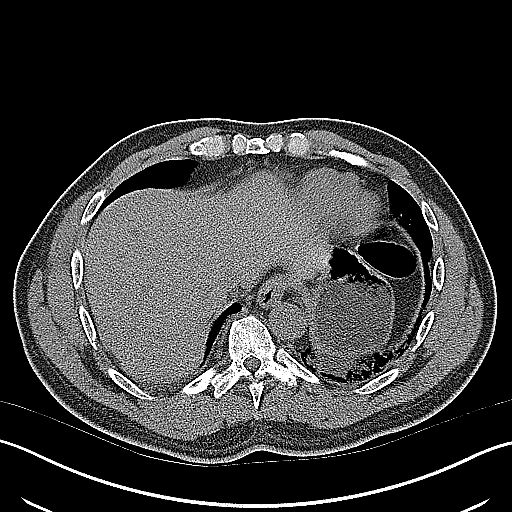
[im 38/65  soft-tissue]
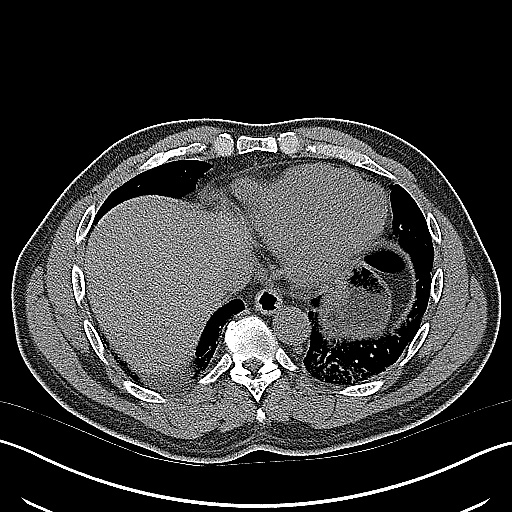
[im 42/65  soft-tissue]
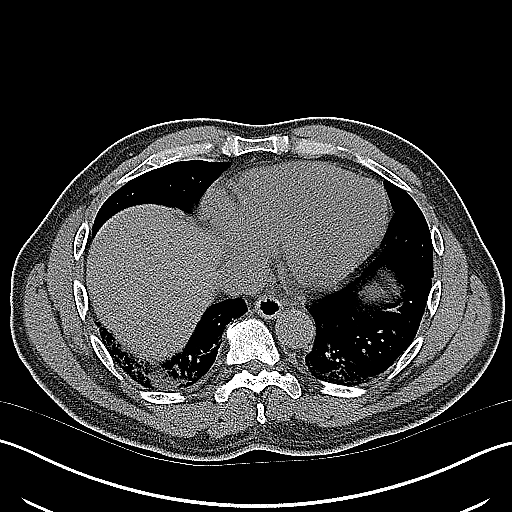
[im 42/65  bone]
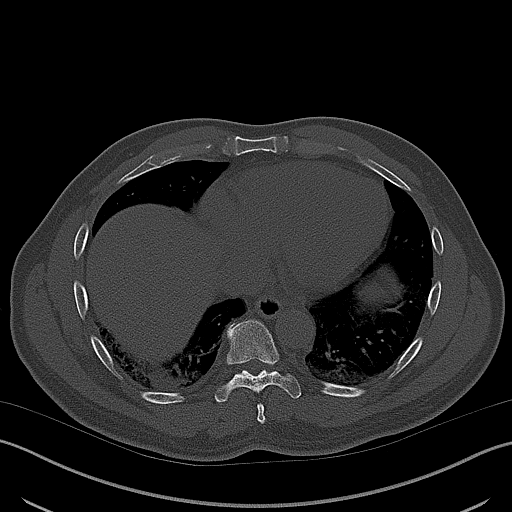
[im 46/65  soft-tissue]
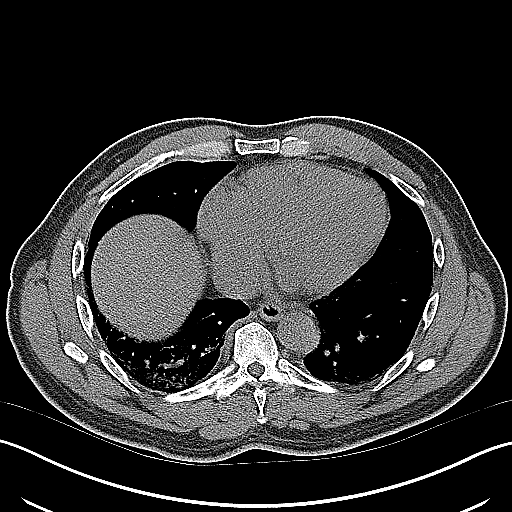
[im 52/65  soft-tissue]
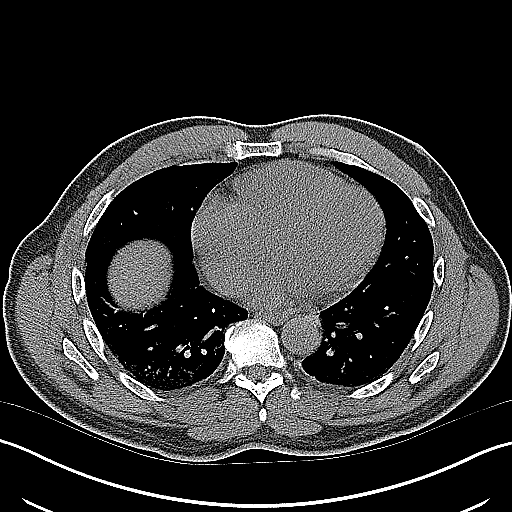
[im 56/65  soft-tissue]
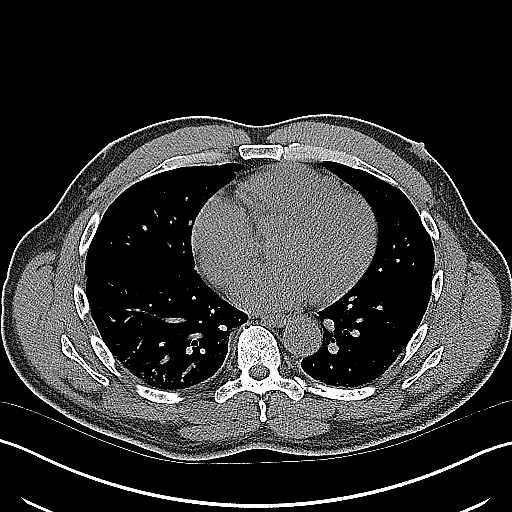
[im 60/65  soft-tissue]
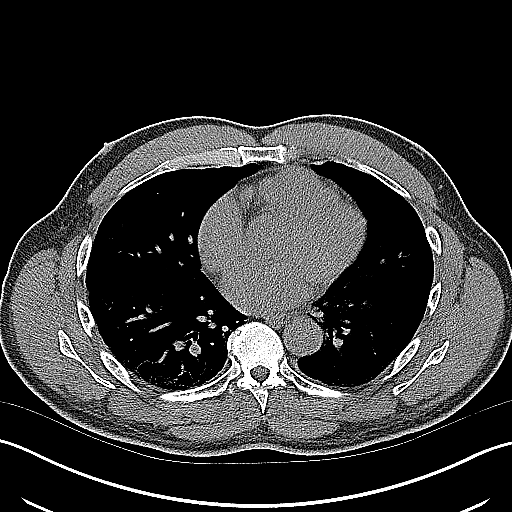

[Series 5: coronal · coronal · 0.66mm/px · 3 of 126 slices shown]
[im 42/126  soft-tissue]
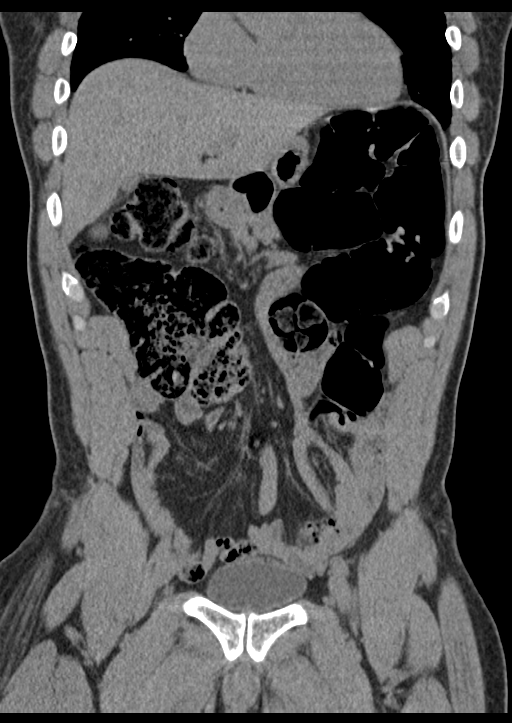
[im 56/126  soft-tissue]
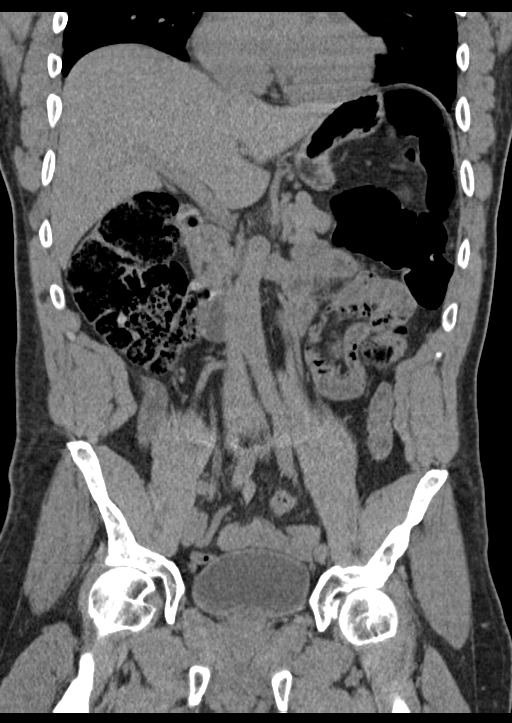
[im 70/126  soft-tissue]
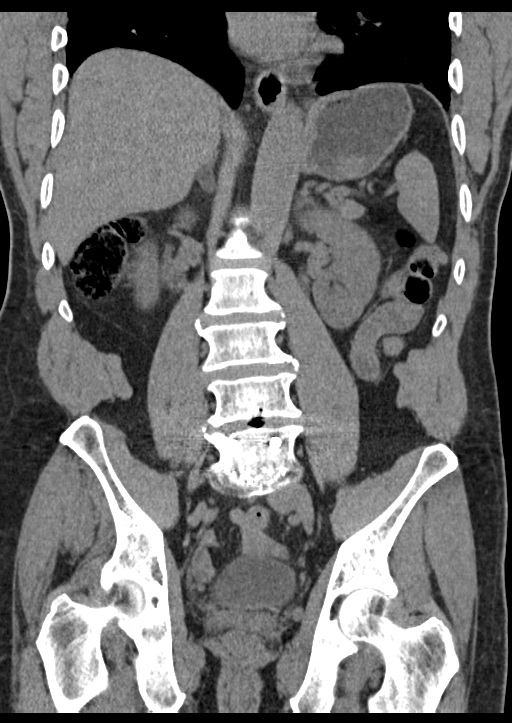

[16 of 46 positions shown; findings below may reference images not displayed]

FINDINGS: Lower chest: Right greater than left base subsegmental atelectasis.
Normal heart size without pericardial or pleural effusion.

Hepatobiliary: Right hepatic lobe cyst. Vague left hepatic lobe
hypoattenuating lesion has been present back to 5022, consistent
with a benign etiology. Normal gallbladder, without biliary ductal
dilatation.

Pancreas: Normal, without mass or ductal dilatation.

Spleen: Normal in size, without focal abnormality.

Adrenals/Urinary Tract: Normal left adrenal gland. Right adrenal 9
mm low-density nodule, most consistent with an adenoma. No renal
calculi or hydronephrosis. No hydroureter or ureteric calculi. No
bladder calculi.

Stomach/Bowel: Normal stomach, without wall thickening. Colonic
stool burden suggests constipation. Normal terminal ileum.
appendectomy. Normal small bowel.

Vascular/Lymphatic: Normal caliber of the aorta and branch vessels.
No abdominopelvic adenopathy.

Reproductive: Normal prostate.

Other: No significant free fluid.

Musculoskeletal: Lumbosacral spine fixation.
IMPRESSION: 1.  No urinary tract calculi or hydronephrosis.
2.  Possible constipation.

## 2018-02-14 DIAGNOSIS — K640 First degree hemorrhoids: Secondary | ICD-10-CM | POA: Diagnosis not present

## 2018-02-14 DIAGNOSIS — Z86010 Personal history of colonic polyps: Secondary | ICD-10-CM | POA: Diagnosis not present

## 2018-02-14 IMAGING — CR DG ABDOMEN 1V
1 series · 1 of 1 positions shown · non-contrast
Comparison: CT abdomen and pelvis November 19, 2016

CLINICAL DATA: Abdominal pain and constipation

EXAM:
ABDOMEN - 1 VIEW

[t abdomen supine]
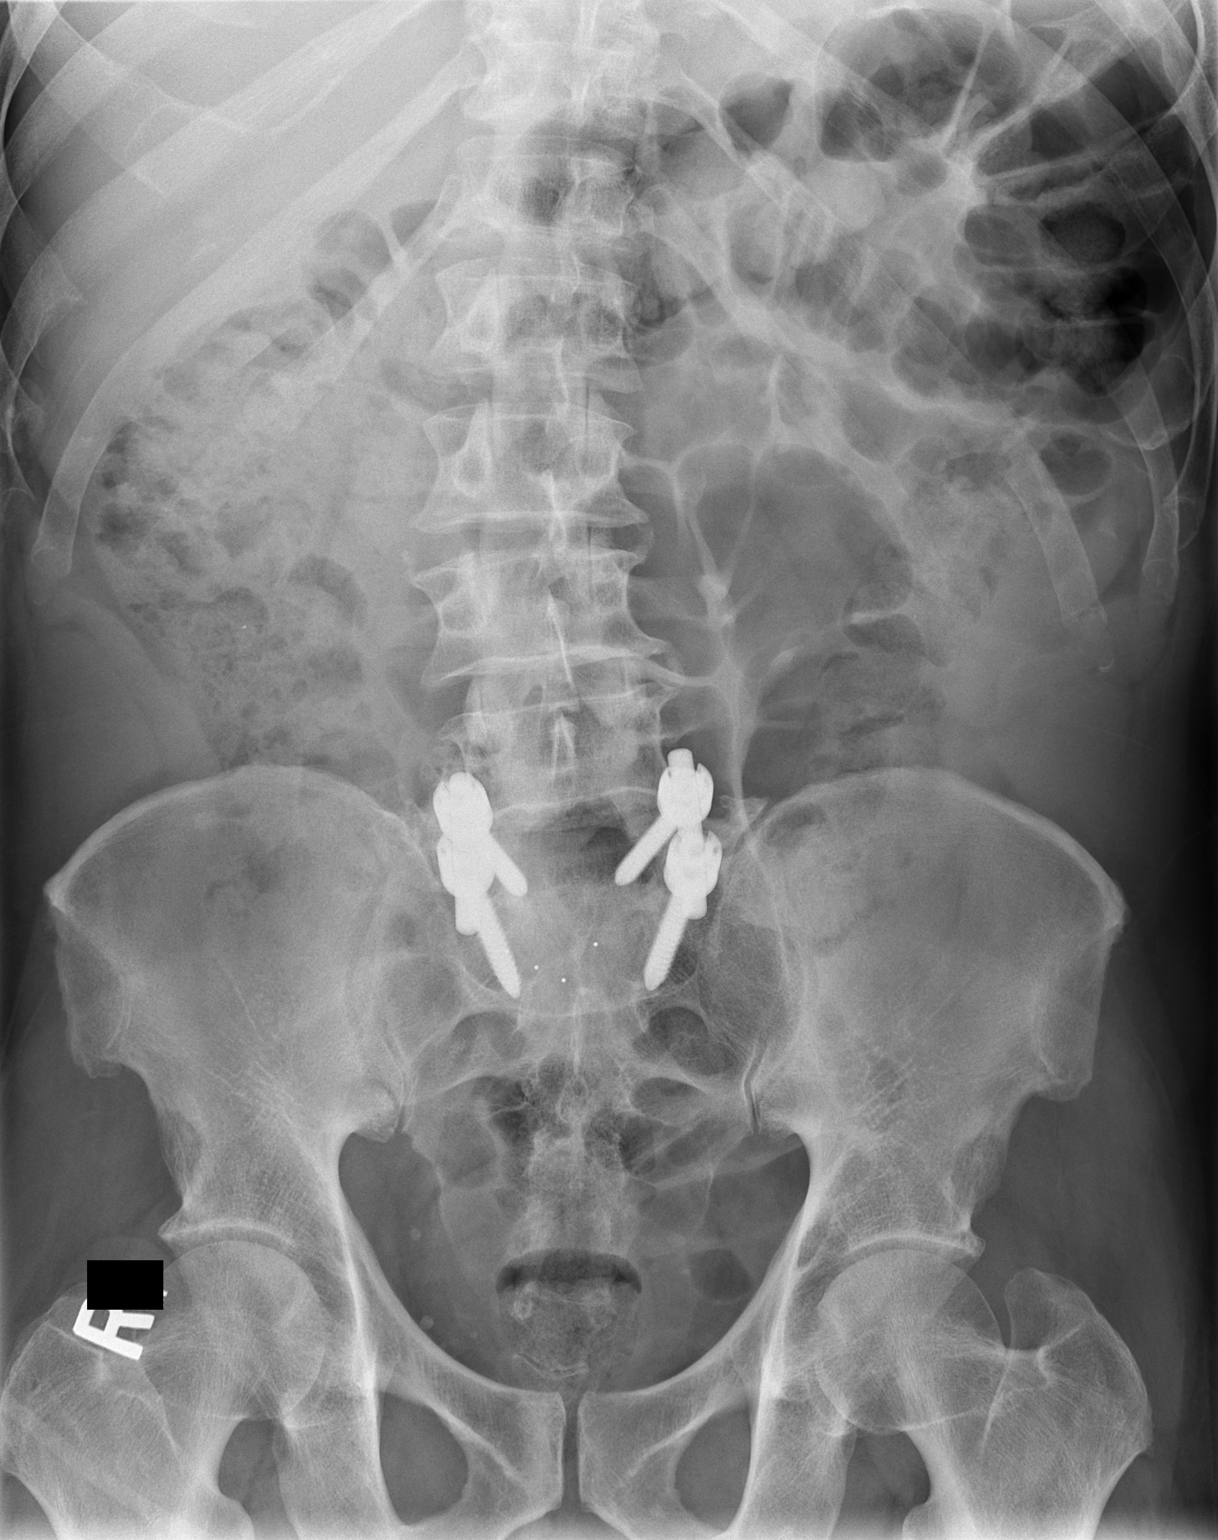

[1 of 1 positions shown; findings below may reference images not displayed]

FINDINGS: There is moderate stool throughout the colon. There is no bowel
dilatation or air-fluid level to suggest bowel obstruction. No free
air. There are phleboliths in the pelvis. There is postoperative
change at L5 and S1.
IMPRESSION: Moderate stool in colon.  No bowel obstruction or free air.

## 2018-02-26 ENCOUNTER — Other Ambulatory Visit: Payer: Self-pay | Admitting: Plastic Surgery

## 2018-02-26 DIAGNOSIS — D170 Benign lipomatous neoplasm of skin and subcutaneous tissue of head, face and neck: Secondary | ICD-10-CM | POA: Diagnosis not present

## 2018-03-12 DIAGNOSIS — J302 Other seasonal allergic rhinitis: Secondary | ICD-10-CM | POA: Diagnosis not present

## 2018-03-12 DIAGNOSIS — Z0001 Encounter for general adult medical examination with abnormal findings: Secondary | ICD-10-CM | POA: Diagnosis not present

## 2018-03-12 DIAGNOSIS — H6123 Impacted cerumen, bilateral: Secondary | ICD-10-CM | POA: Diagnosis not present

## 2018-03-12 DIAGNOSIS — E782 Mixed hyperlipidemia: Secondary | ICD-10-CM | POA: Diagnosis not present

## 2018-03-12 DIAGNOSIS — Z1389 Encounter for screening for other disorder: Secondary | ICD-10-CM | POA: Diagnosis not present

## 2018-03-12 DIAGNOSIS — R7989 Other specified abnormal findings of blood chemistry: Secondary | ICD-10-CM | POA: Diagnosis not present

## 2018-03-12 DIAGNOSIS — N529 Male erectile dysfunction, unspecified: Secondary | ICD-10-CM | POA: Diagnosis not present

## 2018-03-20 ENCOUNTER — Other Ambulatory Visit (INDEPENDENT_AMBULATORY_CARE_PROVIDER_SITE_OTHER): Payer: Self-pay | Admitting: Specialist

## 2018-03-21 NOTE — Telephone Encounter (Signed)
Ultram refill request

## 2018-03-25 DIAGNOSIS — H524 Presbyopia: Secondary | ICD-10-CM | POA: Diagnosis not present

## 2018-03-25 DIAGNOSIS — H401131 Primary open-angle glaucoma, bilateral, mild stage: Secondary | ICD-10-CM | POA: Diagnosis not present

## 2018-04-18 ENCOUNTER — Ambulatory Visit (INDEPENDENT_AMBULATORY_CARE_PROVIDER_SITE_OTHER): Payer: 59 | Admitting: Otolaryngology

## 2018-04-18 DIAGNOSIS — H9313 Tinnitus, bilateral: Secondary | ICD-10-CM

## 2018-04-18 DIAGNOSIS — H903 Sensorineural hearing loss, bilateral: Secondary | ICD-10-CM | POA: Diagnosis not present

## 2018-07-22 DIAGNOSIS — Z6828 Body mass index (BMI) 28.0-28.9, adult: Secondary | ICD-10-CM | POA: Diagnosis not present

## 2018-07-22 DIAGNOSIS — E663 Overweight: Secondary | ICD-10-CM | POA: Diagnosis not present

## 2018-07-22 DIAGNOSIS — R42 Dizziness and giddiness: Secondary | ICD-10-CM | POA: Diagnosis not present

## 2018-07-24 ENCOUNTER — Encounter: Payer: Self-pay | Admitting: Specialist

## 2018-07-24 ENCOUNTER — Ambulatory Visit: Payer: 59 | Admitting: Specialist

## 2018-07-24 ENCOUNTER — Ambulatory Visit: Payer: 59

## 2018-07-24 ENCOUNTER — Other Ambulatory Visit: Payer: Self-pay

## 2018-07-24 VITALS — BP 146/97 | HR 61 | Ht 69.0 in | Wt 180.0 lb

## 2018-07-24 DIAGNOSIS — Z981 Arthrodesis status: Secondary | ICD-10-CM | POA: Diagnosis not present

## 2018-07-24 DIAGNOSIS — G8929 Other chronic pain: Secondary | ICD-10-CM | POA: Diagnosis not present

## 2018-07-24 DIAGNOSIS — G629 Polyneuropathy, unspecified: Secondary | ICD-10-CM

## 2018-07-24 DIAGNOSIS — M533 Sacrococcygeal disorders, not elsewhere classified: Secondary | ICD-10-CM | POA: Diagnosis not present

## 2018-07-24 NOTE — Progress Notes (Signed)
Office Visit Note   Patient: Dwayne Acosta.           Date of Birth: December 10, 1955           MRN: [id number] Visit Date: 07/24/2018              Requested by: Sharilyn Sites, [address] [place], [place] [address] PCP: Sharilyn Sites, MD   Assessment & Plan: Visit Diagnoses:  1. S/P lumbar spinal fusion   2. Chronic right SI joint pain   3. Neuropathy   Right L4-5 Transforaminal lumbar interbody fusion with Depuy screws, rods and cages, local and allograft bone graft, Vivigen December 05, 2016  Plan:  Again we discussed proper body mechanics to help avoid aggravating his back.  He continues to have some right lower extremity radicular symptoms down to the calf.  States that this is not getting worse.  Advised him that this may be as good as he is going to get.  We could consider getting NCV/EMG right lower extremity or CT lumbar.  I do not think that patient has able to return back to his previous line of work.  This was also reflected in Dr. Otho Ket office dictation February 14, 2017.  We will schedule return office visit in 6 months for recheck but he knows to return sooner if his symptoms worsen.  In [place] to the right SI joint pain I advised him that we could try an ultrasound-guided diagnostic/therapeutic SI joint injection with Dr. Junius Roads to see if this would hopefully give him some improvement of his symptoms there.  He understands that it would not help his right leg symptoms.  He will call and let us know if he is wanting to try this.  Follow-Up Instructions: Return in about 6 months (around 01/24/2019).   Orders:  Orders Placed This Encounter  Procedures  . XR Lumbar Spine 2-3 Views   No orders of the defined types were placed in this encounter.     Procedures: No procedures performed   Clinical Data: No additional findings.   Subjective: Chief Complaint  Patient presents with  . Lower Back - Follow-up    HPI  63 year old black male who is status post  right L4-5 Transforaminal lumbar interbody fusion with Depuy screws, rods and cages, local and allograft bone graft, Vivigen October 2018 comes in for recheck.  Patient states that he continues to have some right-sided low back pain where he localizes more to the SI joint.  This aggravated when he is picking up objects at times and ambulating.  Patient also still has some pain and cramping sensation down the right lower leg.  Overall he feels that he is doing much better than his preop symptoms.  Patient has been trying to be compliant with lifting, pushing, pulling, twisting restrictions.  He does not have any problems with the left lower extremity.  Patient stated to [place] today that he has been having problems with getting lightheaded and dizzy at times when he squats down and slightly bends over.  When he goes to get up he feels like he is going to pass out.  He did report this to his primary care physician and has been referred to cardiology for evaluation.  Patient has been out of work since September 2018.    Review of Systems Complains of feeling of lightheadedness.  Has seen cardiology for this.  No complaints of chest pain shortness of breath  Objective: Vital Signs: BP (!) 146/97 (BP  Location: Left Arm, Patient Position: Sitting)   Pulse 61   Ht 5\' 9"  (1.753 m)   Wt 180 lb (81.6 kg)   BMI 26.58 kg/m   Physical Exam HENT:     Head: Normocephalic and atraumatic.  Eyes:     Extraocular Movements: Extraocular movements intact.     Pupils: Pupils are equal, round, and reactive to light.  Pulmonary:     Effort: No respiratory distress.  Neurological:     Mental Status: He is alert.     Ortho Exam  Specialty Comments:  No specialty comments available.  Imaging: No results found.   PMFS History: Patient Active Problem List   Diagnosis Date Noted  . Spinal stenosis of lumbar region 12/05/2016    Class: Chronic  . Spinal stenosis of lumbar region with neurogenic claudication  12/05/2016  . Degenerative disc disease, cervical 11/21/2016  . Spondylosis without myelopathy or radiculopathy, cervical region 11/21/2016  . Acute appendicitis 07/20/2016   Past Medical History:  Diagnosis Date  . Anxiety   . Complication of anesthesia    pain med caused bladder,stomach issue-unable to void"stomach swoll up"13 yrs ago note of surgery  . OAB (overactive bladder)   . Overactive bladder     Family History  Problem Relation Age of Onset  . High blood pressure Mother   . Heart attack Father   . Parkinson's disease Father   . Stroke Paternal Grandmother   . Bone cancer Maternal Grandfather     Past Surgical History:  Procedure Laterality Date  . APPENDECTOMY    . BACK SURGERY  2005  . FOOT SURGERY Left    bunion  . LAPAROSCOPIC APPENDECTOMY N/A 07/20/2016   Procedure: APPENDECTOMY LAPAROSCOPIC;  Surgeon: Armandina Gemma, MD;  Location: [facility];  Service: General;  Laterality: N/A;  . LUMBAR FUSION  12/05/2016  . ROTATOR CUFF REPAIR Bilateral    Social History   Occupational History    Employer: [employer]  Tobacco Use  . Smoking status: Never Smoker  . Smokeless tobacco: Never Used  Substance and Sexual Activity  . Alcohol use: Yes    Alcohol/week: 0.0 standard drinks    Comment: occ-gin or vodka 1-2 week  . Drug use: No  . Sexual activity: Not on file

## 2018-07-26 ENCOUNTER — Telehealth: Payer: Self-pay | Admitting: Specialist

## 2018-07-26 ENCOUNTER — Encounter: Payer: Self-pay | Admitting: Cardiology

## 2018-07-26 NOTE — Progress Notes (Signed)
Virtual Visit via Video Note   This visit type was conducted due to national recommendations for restrictions regarding the COVID-19 Pandemic (e.g. social distancing) in an effort to limit this patient's exposure and mitigate transmission in our community.  Due to his co-morbid illnesses, this patient is at least at moderate risk for complications without adequate follow up.  This format is felt to be most appropriate for this patient at this time.  All issues noted in this document were discussed and addressed.  A limited physical exam was performed with this format.  Please refer to the patient's chart for his consent to telehealth for [facility].   Date:  08/01/2018   ID:  Dwayne Fantasia., DOB [birth year], MRN [id number]  Patient Location: Home Provider Location: Office  PCP:  Sharilyn Sites, MD  Cardiologist: Satira Sark, MD  Evaluation Performed:  New Patient Evaluation  Chief Complaint:   Recurrent dizziness  History of Present Illness:    Dwayne Frett. is a 63 y.o. male referred for cardiology consultation by Dwayne Acosta for the evaluation of longstanding dizziness.  We communicated by video conferencing today.  He describes a fairly longstanding history of intermittent dizziness/lightheadedness.  This is specifically precipitated by bending over and standing up, sometimes he feels lightheaded if he has been standing for a long time and has not hydrated.  He has had syncope at least one time when standing quickly, he mentions that he had been in a hot bathtub and then stood up thereafter suggesting component of vasodilatation.  He does not describe any exertional chest pain or sudden unexplained syncope, no palpitations.  Orthostatic measurements were negative at [facility] with office visit May 18.  I reviewed his medications which are outlined below.  He himself mentioned that he thought it might have been related to medications as his symptoms were worse previously  when taking more pain medicine for chronic back pain.  At this time he is on Voltaren, Neurontin, Robaxin, Zanaflex, Ultram, and intermittent sildenafil.  The patient does not have symptoms concerning for COVID-19 infection (fever, chills, cough, or new shortness of breath).    Past Medical History:  Diagnosis Date  . Anxiety   . Overactive bladder    Past Surgical History:  Procedure Laterality Date  . APPENDECTOMY    . BACK SURGERY  2005  . FOOT SURGERY Left    bunion  . LAPAROSCOPIC APPENDECTOMY N/A 07/20/2016   Procedure: APPENDECTOMY LAPAROSCOPIC;  Surgeon: Armandina Gemma, MD;  Location: [facility];  Service: General;  Laterality: N/A;  . LUMBAR FUSION  12/05/2016  . ROTATOR CUFF REPAIR Bilateral      Current Meds  Medication Sig  . diclofenac (VOLTAREN) 50 MG EC tablet Take 1 tablet (50 mg total) by mouth 3 (three) times daily. (Patient taking differently: Take 50 mg by mouth 3 (three) times daily as needed. )  . fluticasone (FLONASE) 50 MCG/ACT nasal spray Place 2 sprays into both nostrils at bedtime.  . gabapentin (NEURONTIN) 100 MG capsule TAKE 1 CAPSULE BY MOUTH THREE TIMES A DAY (Patient taking differently: Take 100 mg by mouth 2 (two) times daily. )  . latanoprost (XALATAN) 0.005 % ophthalmic solution Place 1 drop into both eyes at bedtime.   . methocarbamol (ROBAXIN) 750 MG tablet TAKE 1 TABLET BY MOUTH EVERY 8 HOURS AS NEEDED FOR MUSCLE SPASMS  . minoxidil (ROGAINE) 2 % external solution Apply topically 1 day or 1 dose.  Marland Kitchen MOVANTIK 25 MG TABS tablet  TAKE 1 TABLET BY MOUTH EVERY DAY (Patient taking differently: Take 25 mg by mouth as needed. )  . sildenafil (REVATIO) 20 MG tablet TAKE 2-5 TABLETS BY MOUTH ONCE A DAY AS NEEDED FOR SEXUAL ACTIVITY  . tizanidine (ZANAFLEX) 2 MG capsule TAKE 1 CAPSULE (2 MG TOTAL) BY MOUTH 3 (THREE) TIMES DAILY AS NEEDED FOR MUSCLE SPASMS.  Marland Kitchen traMADol (ULTRAM) 50 MG tablet TAKE 1 TO 2 TABLETS EVERY 6 HOURS AS NEEDED FOR PAIN  . VESICARE 10 MG  tablet Take 10 mg by mouth at bedtime.    Current Facility-Administered Medications for the 08/01/18 encounter (Telemedicine) with Satira Sark, MD  Medication  . polyethylene glycol (MIRALAX / GLYCOLAX) packet 17 g     Allergies:   Patient has no known allergies.   Social History   Tobacco Use  . Smoking status: Never Smoker  . Smokeless tobacco: Never Used  Substance Use Topics  . Alcohol use: Yes    Alcohol/week: 0.0 standard drinks    Comment: occ-gin or vodka 1-2 week  . Drug use: No     Family Hx: The patient's family history includes Bone cancer in his maternal grandfather; Heart attack in his father; High blood pressure in his mother; Parkinson's disease in his father; Stroke in his paternal grandmother.  ROS:   Please see the history of present illness.    All other systems reviewed and are negative.   Prior CV studies:   The following studies were reviewed today:  No prior cardiac testing.  Labs/Other Tests and Data Reviewed:    EKG:  An ECG dated May 2020 was personally reviewed today and demonstrated:  Sinus bradycardia.  Recent Labs:  No lab work available for review.  Wt Readings from Last 3 Encounters:  08/01/18 185 lb (83.9 kg)  07/24/18 180 lb (81.6 kg)  01/23/18 180 lb (81.6 kg)     Objective:    Vital Signs:  Ht 5\' 9"  (1.753 m)   Wt 185 lb (83.9 kg)   BMI 27.32 kg/m    He did not have a way to check vital signs today. General: Patient appears comfortable at rest, seated in his home. HEENT: Conjunctiva and lids normal. Lungs: Patient spoke in full sentences, not short of breath.  No audible wheezing. Skin: Normal appearance of color and turgor. Neuropsychiatric: Gaze is conjugate.  Speech pattern normal.  Patient moves all extremities.  Affect is appropriate.  ASSESSMENT & PLAN:    1.  Intermittent lightheadedness/dizziness exacerbated by position change such as standing from bending over, also described an episode after being in  a warm bathtub and then standing up.  He reported worse symptoms when he was on more pain medications for chronic back pain, also seems to have worse symptoms when he has not hydrated well.  Fortunately, he was not frankly orthostatic by check at [facility] based on record review.  His description of symptoms is not consistent with a cardiac etiology, does not sound like a sudden onset cardiac arrhythmia, and would not be a typical presentation for ischemic heart disease.  I reviewed his ECG from May which showed sinus bradycardia.  Otherwise normal intervals.  I think that some of this may well be related to his medications and potential changes in autonomic function and/or vasodilatation.  I talked with him about trying to simplify his medical regimen and also maintaining a well-hydrated status, obviously avoiding position changes quickly that he knows will exacerbate symptoms.  Otherwise, no further cardiac testing  is indicated at this time.  He will keep ongoing follow-up with his PCP.  2.  Reported history of heart disease in both parents.  He does not describe any exertional symptoms at this time and otherwise has no known history of hypertension, type 2 diabetes mellitus, or hyperlipidemia.  In terms of basic cardiac risk stratification he could always be considered for a GXT arranged by PCP as part of general evaluation, although ischemic testing is not clearly indicated at this time based on his symptom description.  3.  Patient reported chronic back pain and leg neuropathy with prior history of lumbar surgery.  COVID-19 Education: The signs and symptoms of COVID-19 were discussed with the patient and how to seek care for testing (follow up with PCP or arrange E-visit).  The importance of social distancing was discussed today.  Time:   Today, I have spent 19 minutes with the patient with telehealth technology discussing the above problems.     Medication Adjustments/Labs and Tests Ordered:  Current medicines are reviewed at length with the patient today.  Concerns regarding medicines are outlined above.   Tests Ordered: No orders of the defined types were placed in this encounter.   Medication Changes: No orders of the defined types were placed in this encounter.   Disposition:  Follow up with PCP.  Signed, Rozann Lesches, MD  08/01/2018 9:17 AM    [place]

## 2018-07-26 NOTE — Telephone Encounter (Signed)
Do you remember anything about this?

## 2018-07-26 NOTE — Telephone Encounter (Signed)
Patient called in wanting to know when his letter for Social Sercuity Disability he has as hearing next week and Nitka's PA told him they would get him one. Please call patient when letter is ready so he can come pick it up

## 2018-07-30 NOTE — Telephone Encounter (Signed)
I told him that he could get a copy of his office dictation.  I was not going to do any other type of formal letter for his Social Security.  Office note should be sufficient along with any previous notes from Dr. Louanne Skye.

## 2018-07-31 ENCOUNTER — Other Ambulatory Visit: Payer: Self-pay

## 2018-07-31 NOTE — Telephone Encounter (Signed)
I tried to call but VM was full, I will try again later to call him, per Jeneen Rinks he can submit last OV note, so I have printed it and put it at the front desk for pick up

## 2018-08-01 ENCOUNTER — Telehealth (INDEPENDENT_AMBULATORY_CARE_PROVIDER_SITE_OTHER): Payer: 59 | Admitting: Neurology

## 2018-08-01 ENCOUNTER — Encounter: Payer: Self-pay | Admitting: Cardiology

## 2018-08-01 ENCOUNTER — Telehealth (INDEPENDENT_AMBULATORY_CARE_PROVIDER_SITE_OTHER): Payer: 59 | Admitting: Cardiology

## 2018-08-01 VITALS — BP 115/79 | HR 64 | Ht 69.0 in | Wt 185.0 lb

## 2018-08-01 VITALS — Ht 69.0 in | Wt 186.0 lb

## 2018-08-01 DIAGNOSIS — M545 Low back pain, unspecified: Secondary | ICD-10-CM

## 2018-08-01 DIAGNOSIS — R42 Dizziness and giddiness: Secondary | ICD-10-CM

## 2018-08-01 DIAGNOSIS — G8929 Other chronic pain: Secondary | ICD-10-CM | POA: Diagnosis not present

## 2018-08-01 DIAGNOSIS — Z7189 Other specified counseling: Secondary | ICD-10-CM | POA: Diagnosis not present

## 2018-08-01 DIAGNOSIS — R413 Other amnesia: Secondary | ICD-10-CM | POA: Diagnosis not present

## 2018-08-01 NOTE — Patient Instructions (Signed)
Medication Instructions: Your physician recommends that you continue on your current medications as directed. Please refer to the Current Medication list given to you today.   Labwork: None  Procedures/Testing: None  Follow-Up: No follow up needed  Any Additional Special Instructions Will Be Listed Below (If Applicable).     If you need a refill on your cardiac medications before your next appointment, please call your pharmacy.

## 2018-08-01 NOTE — Progress Notes (Signed)
Virtual Visit via Video Note The purpose of this virtual visit is to provide medical care while limiting exposure to the novel coronavirus.    Consent was obtained for video visit:  Yes.   Answered questions that patient had about telehealth interaction:  Yes.   I discussed the limitations, risks, security and privacy concerns of performing an evaluation and management service by telemedicine. I also discussed with the patient that there may be a patient responsible charge related to this service. The patient expressed understanding and agreed to proceed.  Pt location: Home Physician Location: office Name of referring provider:  Sharilyn Sites, MD I connected with Kandis Fantasia. at patients initiation/request on 08/01/2018 at 10:00 AM EDT by video enabled telemedicine application and verified that I am speaking with the correct person using two identifiers. Pt MRN:  [id number] Pt DOB:  [birth year] Video Participants:  Kandis Fantasia.   History of Present Illness:  This is a pleasant 63 year old right-handed man with a history of back pain, anxiety, presenting for evaluation of memory loss. He started noticing changes around a year ago. He describes memory changes as getting ready to do something then forgetting what he went to do. He struggles with focusing. He would have to repeatedly do the same thing, going outside several times to check that he had locked his truck or if he had left the stove on. He had left the stove on at one time. He forgets names of close friends. His daughter tells him he repeats himself and bought his Focus Factor to help with his memory. A few months ago he forgot to pay 2 bills, now he has a system to help remind him. He denies getting lost driving. He occasionally forgets his medications, but his pain would remind him he needs to take them. He has started writing things down and making notes. 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 He would have to sit and go back to the manuals on how to evaluate equipment. He has been on disability and had not returned to work since his back surgery in 12/2016. There is no family history of dementia. He blacked out and fell coming out of the tub over a year ago. He rarely drinks alcohol.  He has occasional headaches in the back of his head. 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 He gets dizzy when he stoops down, he has seen Cardiology and has been told he is orthostatic. He has been checking his BP regularly, lowest SBP in 100. He has back and right leg pain. He has some neck pain. He denies any diplopia, dysarthria/dysphagia, bowel/bladder dysfunction, anosmia, or tremors. He has occasional numbness in his legs and hands. He usually wakes up a few times at night. He snores and has daytime drowsiness. He denies any personality changes, but notes he can say something harsh when frustrated.     PAST MEDICAL HISTORY: Past Medical History:  Diagnosis Date   Anxiety    Leg pain    Memory loss    Overactive bladder     PAST SURGICAL HISTORY: Past Surgical History:  Procedure Laterality Date   APPENDECTOMY     BACK SURGERY  2005   FOOT SURGERY Left    bunion   LAPAROSCOPIC APPENDECTOMY N/A 07/20/2016   Procedure: APPENDECTOMY LAPAROSCOPIC;  Surgeon: Armandina Gemma, MD;  Location: [facility];  Service: General;  Laterality: N/A;   LUMBAR FUSION  12/05/2016   ROTATOR CUFF REPAIR Bilateral     MEDICATIONS: Current Outpatient Medications on File Prior to Visit  Medication Sig Dispense Refill   diclofenac (VOLTAREN) 50 MG EC tablet Take 1 tablet (50 mg total) by mouth 3 (three) times daily. (Patient taking differently: Take 50 mg by mouth 3 (three) times daily as needed. ) 90  tablet 6   fluticasone (FLONASE) 50 MCG/ACT nasal spray Place 2 sprays into both nostrils at bedtime.     gabapentin (NEURONTIN) 100 MG capsule TAKE 1 CAPSULE BY MOUTH THREE TIMES A DAY (Patient taking differently: Take 100 mg by mouth 2 (two) times daily. ) 30 capsule 6   latanoprost (XALATAN) 0.005 % ophthalmic solution Place 1 drop into both eyes at bedtime.      methocarbamol (ROBAXIN) 750 MG tablet TAKE 1 TABLET BY MOUTH EVERY 8 HOURS AS NEEDED FOR MUSCLE SPASMS 50 tablet 0   minoxidil (ROGAINE) 2 % external solution Apply topically 1 day or 1 dose.     MOVANTIK 25 MG TABS tablet TAKE 1 TABLET BY MOUTH EVERY DAY (Patient taking differently: Take 25 mg by mouth as needed. ) 20 tablet 0   Naproxen Sodium (ALEVE PO) Take by mouth as needed.     sildenafil (REVATIO) 20 MG tablet TAKE 2-5 TABLETS BY MOUTH ONCE A DAY AS NEEDED FOR SEXUAL ACTIVITY  3   tizanidine (ZANAFLEX) 2 MG capsule TAKE 1 CAPSULE (2 MG TOTAL) BY MOUTH 3 (THREE) TIMES DAILY AS NEEDED FOR MUSCLE SPASMS.     traMADol (ULTRAM) 50 MG tablet TAKE 1 TO 2 TABLETS EVERY 6 HOURS AS NEEDED FOR PAIN 20 tablet 0   VESICARE 10 MG tablet Take 10 mg by mouth at bedtime.      Current Facility-Administered Medications on File Prior to Visit  Medication Dose Route Frequency Provider Last Rate Last Dose   fentaNYL (SUBLIMAZE) injection    Anesthesia Intra-op Gaylene Brooks, CRNA   50 mcg at 11/21/16 0655   midazolam (VERSED) 5 MG/5ML injection    Anesthesia Intra-op Gaylene Brooks, CRNA   2 mg at 11/21/16 3662   polyethylene glycol (MIRALAX / GLYCOLAX) packet 17 g  17 g Oral Daily Jessy Oto, MD        ALLERGIES: No Known Allergies  FAMILY HISTORY: Family History  Problem Relation Age of Onset   High blood pressure Mother    Heart attack Father    Parkinson's disease Father    Stroke Paternal Grandmother    Bone cancer Maternal Grandfather     Observations/Objective:   Vitals:   08/01/18 0930  Weight:  186 lb (84.4 kg)  Height: 5\' 9"  (1.753 m)   GEN:  The patient appears stated age and is in NAD.  Neurological examination: Patient is awake, alert, oriented x 3. No aphasia or dysarthria. Decreased fluency. Intact comprehension. Remote memory intact. Able to name and repeat. Cranial nerves: Extraocular movements intact with no nystagmus. No facial asymmetry. Motor: moves all extremities symmetrically, at least anti-gravity x 4. No incoordination on finger to nose testing. Gait: narrow-based and steady, able to tandem walk adequately. Negative Romberg test.  Montreal Cognitive Assessment  08/01/2018  Visuospatial/ Executive (0/5) 5  Naming (0/3) 2  Attention: Read list  of digits (0/2) 2  Attention: Read list of letters (0/1) 1  Attention: Serial 7 subtraction starting at 100 (0/3) 3  Language: Repeat phrase (0/2) 2  Language : Fluency (0/1) 0  Abstraction (0/2) 2  Delayed Recall (0/5) 2  Orientation (0/6) 4  Total 23    Assessment and Plan:   This is a pleasant 63 year old right-handed man with a history of chronic back pain, anxiety, presenting for evaluation of worsening memory. MOCA score today 23/30, indicating mild cognitive impairment. We discussed different causes of memory loss. Check TSH and B12. MRI brain without contrast will be ordered to assess for underlying structural abnormality and assess vascular load. We also discussed how pain and medications can cause cognitive issues. Neuropsychological testing will be helpful to further evaluate cognitive complaints. Consideration for a sleep study can also be done in the future. We discussed the importance of control of vascular risk factors, physical exercise, and brain stimulation exercises for brain health. Follow-up after tests, he knows to call for any changes.   Follow Up Instructions:   -I discussed the assessment and treatment plan with the patient. The patient was provided an opportunity to ask questions and all were answered.  The patient agreed with the plan and demonstrated an understanding of the instructions.   The patient was advised to call back or seek an in-person evaluation if the symptoms worsen or if the condition fails to improve as anticipated.     Cameron Sprang, MD

## 2018-08-03 ENCOUNTER — Other Ambulatory Visit: Payer: 59

## 2018-08-03 ENCOUNTER — Telehealth: Payer: Self-pay

## 2018-08-03 DIAGNOSIS — Z20822 Contact with and (suspected) exposure to covid-19: Secondary | ICD-10-CM

## 2018-08-03 NOTE — Telephone Encounter (Signed)
Called pt. To offer COVID screening following recent Orthopedic appt.  Pt. Declined scheduling at this time.  Stated he will discuss with his wife and call back, if changes his mind.

## 2018-08-03 NOTE — Telephone Encounter (Signed)
Pt. returned call.  Stated he has decided to schedule the COVID testing.  Appt. given for today at 1:30 PM., at the [facility].  Agreed with plan.

## 2018-08-03 NOTE — Addendum Note (Signed)
Addended by: Denman George on: 08/03/2018 11:08 AM   Modules accepted: Orders

## 2018-08-05 ENCOUNTER — Telehealth: Payer: 59 | Admitting: Neurology

## 2018-08-05 LAB — NOVEL CORONAVIRUS, NAA: SARS-CoV-2, NAA: NOT DETECTED

## 2018-08-06 ENCOUNTER — Other Ambulatory Visit: Payer: Self-pay

## 2018-08-06 DIAGNOSIS — R413 Other amnesia: Secondary | ICD-10-CM

## 2018-08-07 ENCOUNTER — Other Ambulatory Visit (INDEPENDENT_AMBULATORY_CARE_PROVIDER_SITE_OTHER): Payer: Self-pay | Admitting: Specialist

## 2018-08-07 NOTE — Telephone Encounter (Signed)
Tizanidine refill request 

## 2018-08-09 NOTE — Telephone Encounter (Signed)
I have tried to reach patient several times without being able to contact him, his voicemail is completely full.  If he calls back the ov note is in the folders up front for him to pick up

## 2018-10-17 ENCOUNTER — Ambulatory Visit (INDEPENDENT_AMBULATORY_CARE_PROVIDER_SITE_OTHER): Payer: 59 | Admitting: Otolaryngology

## 2018-10-21 ENCOUNTER — Other Ambulatory Visit: Payer: Self-pay

## 2018-10-21 DIAGNOSIS — Z20822 Contact with and (suspected) exposure to covid-19: Secondary | ICD-10-CM

## 2018-10-23 LAB — NOVEL CORONAVIRUS, NAA: SARS-CoV-2, NAA: NOT DETECTED

## 2018-12-05 ENCOUNTER — Telehealth: Payer: Self-pay | Admitting: Specialist

## 2018-12-05 NOTE — Telephone Encounter (Signed)
Patient called needing records be sent to [facility]. He stated they are going to fax request for updated records. I told him I would take care of it for him.

## 2018-12-11 ENCOUNTER — Other Ambulatory Visit: Payer: Self-pay

## 2018-12-11 DIAGNOSIS — Z20822 Contact with and (suspected) exposure to covid-19: Secondary | ICD-10-CM

## 2018-12-13 LAB — NOVEL CORONAVIRUS, NAA: SARS-CoV-2, NAA: NOT DETECTED

## 2018-12-14 ENCOUNTER — Telehealth: Payer: Self-pay

## 2018-12-14 NOTE — Telephone Encounter (Signed)
Patient given results as noted not detected, he verbalized understanding.

## 2018-12-26 ENCOUNTER — Other Ambulatory Visit: Payer: Self-pay

## 2018-12-26 DIAGNOSIS — Z20822 Contact with and (suspected) exposure to covid-19: Secondary | ICD-10-CM

## 2018-12-26 DIAGNOSIS — Z20828 Contact with and (suspected) exposure to other viral communicable diseases: Secondary | ICD-10-CM | POA: Diagnosis not present

## 2018-12-28 LAB — NOVEL CORONAVIRUS, NAA: SARS-CoV-2, NAA: NOT DETECTED

## 2019-01-24 ENCOUNTER — Ambulatory Visit (INDEPENDENT_AMBULATORY_CARE_PROVIDER_SITE_OTHER): Payer: 59 | Admitting: Specialist

## 2019-01-24 ENCOUNTER — Other Ambulatory Visit: Payer: Self-pay

## 2019-01-24 ENCOUNTER — Encounter: Payer: Self-pay | Admitting: Specialist

## 2019-01-24 ENCOUNTER — Ambulatory Visit: Payer: Self-pay

## 2019-01-24 VITALS — BP 120/77 | HR 62 | Ht 69.0 in | Wt 180.0 lb

## 2019-01-24 DIAGNOSIS — M5136 Other intervertebral disc degeneration, lumbar region: Secondary | ICD-10-CM | POA: Diagnosis not present

## 2019-01-24 DIAGNOSIS — Z981 Arthrodesis status: Secondary | ICD-10-CM | POA: Diagnosis not present

## 2019-01-24 DIAGNOSIS — M533 Sacrococcygeal disorders, not elsewhere classified: Secondary | ICD-10-CM | POA: Diagnosis not present

## 2019-01-24 DIAGNOSIS — Z20822 Contact with and (suspected) exposure to covid-19: Secondary | ICD-10-CM

## 2019-01-24 NOTE — Patient Instructions (Signed)
Avoid frequent bending and stooping  No lifting greater than 10 lbs. May use ice or moist heat for pain. Weight loss is of benefit. Best medication for lumbar disc disease is arthritis medications like diclofenac. Exercise is important to improve your indurance and does allow people to function better inspite of back pain.   

## 2019-01-24 NOTE — Progress Notes (Signed)
Office Visit Note   Patient: Dwayne Acosta.           Date of Birth: [birth year]           MRN: [id number] Visit Date: 01/24/2019              Requested by: Sharilyn Sites, [address],  [place] [address] PCP: Sharilyn Sites, MD   Assessment & Plan: Visit Diagnoses:  1. S/P lumbar spinal fusion   2. Degenerative disc disease, lumbar   3. Pain of left sacroiliac joint     Plan: Avoid frequent bending and stooping  No lifting greater than 10 lbs. May use ice or moist heat for pain. Weight loss is of benefit. Best medication for lumbar disc disease is arthritis medications like diclofenac. Exercise is important to improve your indurance and does allow people to function better inspite of back pain.   Follow-Up Instructions: No follow-ups on file.   Orders:  Orders Placed This Encounter  Procedures  . XR Lumbar Spine 2-3 Views   No orders of the defined types were placed in this encounter.     Procedures: No procedures performed   Clinical Data: No additional findings.   Subjective: Chief Complaint  Patient presents with  . Lower Back - Follow-up    63 year old male with lumbar degenerative disc disease and lumbar scoliosis. He has had Lumbar fusions for spondylolisthesis and spinal stenosis at L4-5 and L5-S1. He has arthritis  Pain in the left hip and buttock and in the lower back. Diclofenac seems to decrease the pain and  He would like to avoid steroid injection. He is avoiding bending and stooping, washing car and  Twisting. He has to pay attention to posture with sitting to decrease the pain.  Walking improves the pain. He will have muscle spasm in the right leg and pain into the right foot Into the right plantar heel. Meds are very help, not taking the diclofenac continuously. No bowel or bladder difficulty.    Review of Systems  Constitutional: Negative for activity change, appetite change, chills, diaphoresis, fatigue, fever and  unexpected weight change.  HENT: Negative.  Negative for congestion, dental problem, drooling, ear discharge, ear pain, facial swelling, hearing loss, mouth sores, nosebleeds, postnasal drip, rhinorrhea, sinus pressure, sinus pain, sneezing, sore throat, tinnitus, trouble swallowing and voice change.   Eyes: Negative.   Respiratory: Negative.   Cardiovascular: Negative.   Gastrointestinal: Negative.   Endocrine: Negative.   Genitourinary: Positive for urgency.  Musculoskeletal: Positive for back pain. Negative for arthralgias, gait problem, joint swelling, myalgias and neck pain.  Skin: Negative.   Allergic/Immunologic: Negative for environmental allergies and food allergies.  Neurological: Negative.  Negative for dizziness, tremors, seizures, syncope, facial asymmetry, speech difficulty, weakness, light-headedness, numbness and headaches.  Hematological: Negative.   Psychiatric/Behavioral: Negative.      Objective: Vital Signs: BP 120/77 (BP Location: Left Arm, Patient Position: Sitting)   Pulse 62   Ht 5\' 9"  (1.753 m)   Wt 180 lb (81.6 kg)   BMI 26.58 kg/m   Physical Exam Constitutional:      Appearance: He is well-developed.  HENT:     Head: Normocephalic and atraumatic.  Eyes:     Pupils: Pupils are equal, round, and reactive to light.  Neck:     Musculoskeletal: Normal range of motion and neck supple.  Pulmonary:     Effort: Pulmonary effort is normal.     Breath sounds: Normal breath  sounds.  Abdominal:     General: Bowel sounds are normal.     Palpations: Abdomen is soft.  Skin:    General: Skin is warm and dry.  Neurological:     Mental Status: He is alert and oriented to person, place, and time.  Psychiatric:        Behavior: Behavior normal.        Thought Content: Thought content normal.        Judgment: Judgment normal.     Back Exam   Tenderness  The patient is experiencing tenderness in the lumbar.  Range of Motion  Extension: abnormal  Flexion:  abnormal  Lateral bend right: abnormal  Lateral bend left: abnormal  Rotation right: abnormal  Rotation left: abnormal   Muscle Strength  Right Quadriceps:  5/5  Left Quadriceps:  5/5  Right Hamstrings:  5/5  Left Hamstrings:  5/5   Tests  Straight leg raise right: negative Straight leg raise left: negative  Reflexes  Patellar: 2/4 Achilles: 2/4 Babinski's sign: normal   Other  Toe walk: normal Heel walk: normal Erythema: no back redness Scars: present      Specialty Comments:  No specialty comments available.  Imaging: No results found.   PMFS History: Patient Active Problem List   Diagnosis Date Noted  . Spinal stenosis of lumbar region 12/05/2016    Priority: High    Class: Chronic  . Spinal stenosis of lumbar region with neurogenic claudication 12/05/2016  . Degenerative disc disease, cervical 11/21/2016  . Spondylosis without myelopathy or radiculopathy, cervical region 11/21/2016  . Acute appendicitis 07/20/2016  . 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 Increased frequency of urination 02/26/2015  . Urinary urgency 02/26/2015  . Conjunctivitis, chemical 04/16/2013  . Dry eye syndrome 12/12/2012  . 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 NS (nuclear sclerosis) 10/23/2012  . 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 Anxiety   . Leg pain   . Memory loss   . Overactive bladder     Family History  Problem Relation Age of Onset  . High blood pressure Mother   . Heart attack Father   . Parkinson's disease Father   . Stroke Paternal Grandmother   . Bone cancer Maternal Grandfather     Past Surgical History:  Procedure Laterality Date  . APPENDECTOMY    . BACK SURGERY  2005  . FOOT SURGERY Left    bunion  . LAPAROSCOPIC APPENDECTOMY N/A 07/20/2016   Procedure: APPENDECTOMY  LAPAROSCOPIC;  Surgeon: Armandina Gemma, MD;  Location: [facility];  Service: General;  Laterality: N/A;  . LUMBAR FUSION  12/05/2016  . ROTATOR CUFF REPAIR Bilateral    Social History   Occupational History    Employer: [employer]  Tobacco Use  . Smoking status: Never Smoker  . Smokeless tobacco: Never Used  Substance and Sexual Activity  . Alcohol use: Yes    Alcohol/week: 0.0 standard drinks    Comment: occ-gin or vodka 1-2 week  . Drug use: No  . Sexual activity: Not on file

## 2019-01-27 LAB — NOVEL CORONAVIRUS, NAA: SARS-CoV-2, NAA: NOT DETECTED

## 2019-02-06 ENCOUNTER — Ambulatory Visit (INDEPENDENT_AMBULATORY_CARE_PROVIDER_SITE_OTHER): Payer: 59 | Admitting: Psychology

## 2019-02-06 ENCOUNTER — Ambulatory Visit: Payer: 59 | Admitting: Psychology

## 2019-02-06 ENCOUNTER — Encounter: Payer: Self-pay | Admitting: Psychology

## 2019-02-06 ENCOUNTER — Other Ambulatory Visit: Payer: Self-pay

## 2019-02-06 DIAGNOSIS — R41840 Attention and concentration deficit: Secondary | ICD-10-CM | POA: Diagnosis not present

## 2019-02-06 DIAGNOSIS — R413 Other amnesia: Secondary | ICD-10-CM

## 2019-02-06 NOTE — Progress Notes (Signed)
NEUROPSYCHOLOGICAL EVALUATION Dwayne Acosta. [facility] Department of Neurology  Reason for Referral:   Dwayne Acosta. is a 63 y.o. African-American male referred by Ellouise Newer, M.D., to characterize his current cognitive functioning and assist with diagnostic clarity and treatment planning in the context of subjective cognitive dysfunction.  Assessment and Plan:   Clinical Impression(s): Dwayne Acosta pattern of performance is suggestive of neuropsychological functioning largely within appropriate normative ranges based upon premorbid intellectual estimates. A noted weakness was exhibited across basic and complex attention. Performance variability was also exhibited across retrieval aspects of verbal memory measures. Overall, performance was appropriate across domains of processing speed, executive functioning, receptive and expressive language, visuospatial functioning, and other aspects of verbal and visual memory.  Given premorbid intellectual estimates, scores in the below average to average normative ranges were viewed as appropriate overall. As there is no prior cognitive testing to base comparisons on, it is unclear if below average scores represent evidence for cognitive decline, or if they are better accounted for by longstanding relative weaknesses and/or normal intraindividual variability. The etiology of his attentional deficits are unclear overall. However, they could certainly be related to ongoing chronic pain stemming from his prior back surgeries and ongoing disability. Polypharmacy, sleep disruptions, and symptoms of anxiety or various psychosocial stressors experienced in his day-to-day life can also compromise cognitive efficiency.   Recommendations: A repeat neuropsychological evaluation could be considered should Dwayne Acosta experience noticeable cognitive (or functional) decline in the future. The current evaluation would serve as a excellent baseline  to compare future evaluations against.  Assurance that pain symptoms are optimally managed will be important in maximizing his cognitive efficiency moving forward. He also reported experiences of dizziness and lightheadedness, especially when stooping low or rising quickly. If not already followed, a referral to a cardiologist for an evaluation may be prudent to assure that blood pressure and other cardiovascular systems are functioning appropriately.   Dwayne Acosta is encouraged to attend to lifestyle factors for brain health (e.g., regular physical exercise, good nutrition habits, regular participation in cognitively-stimulating activities, and general stress management techniques), which are likely to have benefits for both emotional adjustment and cognition.  If interested, there are some activities which have therapeutic value and can be useful in keeping him cognitively stimulated. For suggestions, Dwayne Acosta is encouraged to go to the following website: https://www.[facility].org/get-to-know-barrow/centers-programs/neurorehabilitation-center/neuro-rehab-apps-and-games/ which has options, categorized by level of difficulty. It should be noted that these activities should not be viewed as a substitute for therapy.  To address problems with fluctuating attention, he may wish to consider:   -Avoiding external distractions when needing to concentrate   -Limiting exposure to fast paced environments with multiple sensory demands   -Writing down complicated information and using checklists   -Attempting and completing one task at a time (i.e., no multi-tasking)   -Verbalizing aloud each step of a task to maintain focus   -Reducing the amount of information considered at one time  Review of Records:   Dwayne Acosta was seen by [facility] Marland KitchenEllouise Newer, M.D.) on 08/01/2018 for an evaluation of memory loss. Difficulties were said to have started 1 year prior and included him getting ready to do  something then forgetting what he went to do, as well as repeatedly doing the same thing (e.g., going outside several times to check that he had locked his truck or if he had left the stove on). He also reported trouble remembering the names of familiar individuals at times. Performance  on a brief cognitive screening instrument (Crisp) was 23/30. Points were lost across the following domains: naming (2/3), verbal fluency (0/1), delayed recall (2/5), and orientation (4/6). Ultimately, he was referred for a comprehensive neuropsychological evaluation to characterize his cognitive abilities and to assist with diagnostic clarity and treatment planning.  Brain MRI on 12/31/2013 revealed mild, nonspecific, periventricular white matter changes about the atria of the lateral ventricles.  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 10/23/2012   Overactive bladder    Spinal stenosis of lumbar region with neurogenic claudication 12/05/2016   Spondylosis without myelopathy or radiculopathy, cervical region 11/21/2016    Past Surgical History:  Procedure Laterality Date   APPENDECTOMY     BACK SURGERY  2005   FOOT SURGERY Left    bunion   LAPAROSCOPIC APPENDECTOMY N/A 07/20/2016   Procedure: APPENDECTOMY LAPAROSCOPIC;  Surgeon: Armandina Gemma, MD;  Location: [facility] ORS;  Service: General;  Laterality: N/A;   LUMBAR FUSION  12/05/2016   ROTATOR CUFF REPAIR Bilateral     Family History  Problem Relation Age of Onset   High blood pressure Mother    Heart attack Father    Parkinson's disease Father    Stroke Paternal Grandmother    Bone cancer Maternal Grandfather      Current Outpatient Medications:    diclofenac  (VOLTAREN) 50 MG EC tablet, Take 1 tablet (50 mg total) by mouth 3 (three) times daily. (Patient taking differently: Take 50 mg by mouth 3 (three) times daily as needed. ), Disp: 90 tablet, Rfl: 6   fluticasone (FLONASE) 50 MCG/ACT nasal spray, Place 2 sprays into both nostrils at bedtime., Disp: , Rfl:    gabapentin (NEURONTIN) 100 MG capsule, TAKE 1 CAPSULE BY MOUTH THREE TIMES A DAY (Patient taking differently: Take 100 mg by mouth 2 (two) times daily. ), Disp: 30 capsule, Rfl: 6   latanoprost (XALATAN) 0.005 % ophthalmic solution, Place 1 drop into both eyes at bedtime. , Disp: , Rfl:    methocarbamol (ROBAXIN) 750 MG tablet, TAKE 1 TABLET BY MOUTH EVERY 8 HOURS AS NEEDED FOR MUSCLE SPASMS, Disp: 50 tablet, Rfl: 0   minoxidil (ROGAINE) 2 % external solution, Apply topically 1 day or 1 dose., Disp: , Rfl:    MOVANTIK 25 MG TABS tablet, TAKE 1 TABLET BY MOUTH EVERY DAY (Patient taking differently: Take 25 mg by mouth as needed. ), Disp: 20 tablet, Rfl: 0   Naproxen Sodium (ALEVE PO), Take by mouth as needed., Disp: , Rfl:    sildenafil (REVATIO) 20 MG tablet, TAKE 2-5 TABLETS BY MOUTH ONCE A DAY AS NEEDED FOR SEXUAL ACTIVITY, Disp: , Rfl: 3   tizanidine (ZANAFLEX) 2 MG capsule, TAKE 1 CAPSULE (2 MG TOTAL) BY MOUTH 3 (THREE) TIMES DAILY AS NEEDED FOR MUSCLE SPASMS., Disp: 270 capsule, Rfl: 6   traMADol (ULTRAM) 50 MG tablet, TAKE 1 TO 2 TABLETS EVERY 6 HOURS AS NEEDED FOR PAIN, Disp: 20 tablet, Rfl: 0   VESICARE 10 MG tablet, Take 10 mg by mouth at bedtime. , Disp: , Rfl:   Current Facility-Administered Medications:    polyethylene glycol (MIRALAX / GLYCOLAX) packet 17 g, 17 g, Oral, Daily, Jessy Oto, MD  Facility-Administered  Medications Ordered in Other Visits:    fentaNYL (SUBLIMAZE) injection, , , Anesthesia Intra-op, Gaylene Brooks, CRNA, 50 mcg at 11/21/16 0655   midazolam (VERSED) 5 MG/5ML injection, , , Anesthesia Intra-op, Gaylene Brooks, CRNA, 2 mg at 11/21/16  [id number]  Clinical Interview:   Cognitive Symptoms: Decreased short-term memory: Endorsed. Provided examples included going to perform an activity but forgetting his original intention, trouble remembering the names of coworkers, and other symptoms of generalized forgetfulness. Symptoms were said to be present for the past 2-3 years and have gradually worsened over that time.  Decreased long-term memory: Denied. Decreased attention/concentration: Endorsed. He reported sometimes having difficulty grasping storylines while watching television. He theorized that this may be interest-dependent however, as this difficulty is not present all the time. He also acknowledged some trouble with being more easily distracted, including starting and leaving many projects unfinished.  Reduced processing speed: Denied. Difficulties with executive functions: Endorsed. Difficulties were said to surround organization and multi-tasking. Trouble with using good judgment or acting impulsively were denied. Difficulties with emotion regulation: Denied. Difficulties with receptive language: Denied. Difficulties with word finding: Denied. Decreased visuoperceptual ability: Denied.  Difficulties completing ADLs: Denied.  Additional Medical History: History of traumatic brain injury/concussion: Denied. History of stroke: Denied. History of seizure activity: Denied. History of known exposure to toxins: Denied. Symptoms of chronic pain: Endorsed. Dwayne Acosta reported chronic back pain and has underwent 2 back surgeries. His most recent surgery was said to occur 2 years prior and involve the L4-L5 region of his spine. He reported continued tingling sensations and right leg pain/weakness stemming from this procedure. He also noted pain stemming from symptoms of arthritis in his joints.  Experience of frequent headaches/migraines: Denied. Frequent instances of dizziness/vertigo: Denied. However, he did acknowledge feeling  lightheaded often, especially when stooping low and standing back up. After performing this action, he noted commonly feeling like he is about to black out. Exercise was also said to bring on symptoms of lightheadedness.   Sensory changes: Dwayne Acosta is receiving ongoing treatment for suspected glaucoma. He wears glasses with positive effect. He also reported mild hearing loss. Other sensory changes/difficulties (e.g., taste and smell) were denied.  Balance/coordination difficulties: Largely denied. Specifically, he described his balance as "good" as long as he does not stoop down and stand back up quickly (see above section).  Other motor difficulties: Denied.  Sleep History: Estimated hours obtained each night: 7 hours. Difficulties falling asleep: Denied. Difficulties staying asleep: Endorsed. He reported a history of an overactive bladder and awakes several times per night to use the restroom. Feels rested and refreshed upon awakening: Variably, depending on how many times he wakes up throughout the night.   History of snoring: Endorsed. History of waking up gasping for air: Denied. Witnessed breath cessation while asleep: Denied.  History of vivid dreaming: Denied. Excessive movement while asleep: Denied. Instances of acting out his dreams: Denied.  Psychiatric/Behavioral Health History: Depression: Denied. He described his current mood as "pretty good" and denied a history of depression or other mental health concerns. Current or remote suicidal ideation, intent, or plan was likewise denied.  Anxiety: Denied. Mania: Denied. Trauma History: Denied. Visual/auditory hallucinations: Denied. Delusional thoughts: Denied. Mental health treatment: Denied.  Tobacco: Denied. Alcohol: He reported consuming 1 alcoholic beverage approximately 1-2 times per week. He denied a history of problematic alcohol use, abuse, or dependence.  Recreational drugs: Denied. Caffeine: 1 cup of coffee in  the morning.  Academic/Vocational History: Highest level of  educational attainment: 16 years. Dwayne Acosta has 2 Bachelor's degrees in Psychologist, forensic. 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   History of developmental delay: Denied. History of grade repetition: Denied. Enrollment in special education courses: Denied. History of diagnosed specific learning disability: Denied. History of ADHD: Denied.  Employment: Disabled. Since his back surgery 2 years prior, Dwayne Acosta has been on disability with lifting restrictions of no more than 15 lbs. 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   Evaluation Results:   Behavioral Observations: Dwayne Acosta was unaccompanied, arrived to his appointment on time, and was appropriately dressed and groomed. Observed gait and station were within normal limits. Gross motor functioning appeared intact upon informal observation and no abnormal movements (e.g., tremors) were noted. His affect was generally relaxed and positive, but did range appropriately given the subject being discussed during the clinical interview or the task at hand during testing procedures. Spontaneous speech was fluent and word finding difficulties were not observed during the clinical interview or testing procedures. He appeared nervous at the start of testing; however, this seemed to subside over time. Sustained attention was appropriate throughout. Thought processes were coherent, organized, and normal in content. Task engagement was adequate and he persisted when challenged. Overall, Dwayne Acosta was cooperative with the clinical interview and subsequent testing procedures.   Adequacy of Effort: The validity of neuropsychological testing is limited by the extent to which the individual being tested may be assumed to have exerted adequate effort during testing. Dwayne Acosta expressed his intention to perform to  the best of his abilities and exhibited adequate task engagement and persistence. Scores across stand-alone and embedded performance validity measures were variable. As such, the results of the current evaluation should be interpreted with a mild degree of caution.  Test Results: Dwayne Acosta was fully oriented at the time of the current evaluation.  Intellectual abilities based upon educational and vocational attainment were estimated to be in the average range. Premorbid abilities were estimated to be within the average range based upon a single-word reading test.   Processing speed was average to above average. Basic attention was well below average. More complex attention (e.g., working memory) was also well below average. Executive functioning was below average to average and within appropriate normative ranges overall.  Assessed receptive language abilities were within normal limits. Likewise, Dwayne Acosta did not exhibit any difficulties comprehending task instructions and answered all questions asked of him appropriately. Assessed expressive language (e.g., verbal fluency and confrontation naming) was variable, but overall within appropriate normative ranges. Confrontation naming was average while verbal fluency was below average.     Assessed visuospatial/visuoconstructional abilities were somewhat variable, but within normal limits overall.    Learning (i.e., encoding) of novel verbal and visual information was below average to average. Spontaneous delayed recall (i.e., retrieval) of previously learned information was generally commensurate with performance across initial learning trials; however, a weakness was noted across a list learning task. Retention rates were variable across memory measures. Performance across recognition tasks was likewise variable, suggesting some evidence for information consolidation. A weakness was exhibited across a list learning task.   Results of emotional  screening instruments suggested that recent symptoms of generalized anxiety were in the none to slight range, while symptoms of depression were within normal limits. A screening instrument assessing recent sleep quality suggested the presence of minimal sleep dysfunction.  Tables of Scores:   Note:  This summary of test scores accompanies the interpretive report and should not be considered in isolation without reference to the appropriate sections in the text. Descriptors are based on appropriate normative data and may be adjusted based on clinical judgment. The terms impaired and within normal limits (WNL) are used when a more specific level of functioning cannot be determined.       Effort Testing:   DESCRIPTOR       ACS Word Choice: --- --- Below Expectation  CVLT-III Forced Choice Recognition: --- --- Within Expectation  BVMT-R Retention Percentage: --- --- Within Expectation       Orientation:      Raw Score Percentile   NAB Orientation, Form 1 29/29 --- ---       Intellectual Functioning:           Standard Score Percentile   Test of Premorbid Functioning: 92 30 Average       Memory:          Wechsler Memory Scale (WMS-IV):                       Raw Score (Scaled Score) Percentile     Logical Memory I 19/50 (7) 16 Below Average    Logical Memory II 13/50 (7) 16 Below Average    Logical Memory Recognition 24/30 26-50 Average       [place] Verbal Learning Test (CVLT-III), Standard Form: Raw Score (Scaled/Standard Score) Percentile     Total Trials 1-5 36/80 (90) 25 Average    List B 4/16 (9) 37 Average    Short-Delay Free Recall 6/16 (7) 16 Below Average    Short-Delay Cued Recall 10/16 (9) 37 Average    Long Delay Free Recall 4/16 (6) 9 Below Average    Long Delay Cued Recall 6/16 (6) 9 Below Average      Recognition Hits 15/16 (10) 50 Average      False Positive Errors 13 (4) 2 Well Below Average       Brief Visuospatial Memory Test (BVMT-R), Form 1: Raw  Score (T Score) Percentile     Total Trials 1-3 21/36 (48) 42 Average    Delayed Recall 8/12 (48) 42 Average    Recognition Discrimination Index 6 >16 Within Normal Limits      Recognition Hits 6/6 >16 Within Normal Limits      False Positive Errors 0 >16 Within Normal Limits        Attention/Executive Function:          Trail Making Test (TMT): Raw Score (T Score) Percentile     Part A 29 secs.,  0 errors (54) 66 Average    Part B 75 secs.,  0 errors (54) 66 Average        Scaled Score Percentile   WAIS-IV Coding: 11 63 Average       NAB Attention Module, Form 1: T Score Percentile     Digits Forward 35 7 Well Below Average    Digits Backwards 32 4 Well Below Average       D-KEFS Color-Word Interference Test: Raw Score (Scaled Score) Percentile     Color Naming 25 secs. (13) 84 Above Average    Word Reading 21 secs. (12) 75 Above Average    Inhibition 47 secs. (14) 91 Above Average      Total Errors 1 error (11) 63 Average    Inhibition/Switching 52 secs. (14) 91 Above Average      Total Errors  0 errors (13) 84 Above Average       D-KEFS Verbal Fluency Test: Raw Score (Scaled Score) Percentile     Letter Total Correct 25 (7) 16 Below Average    Category Total Correct 28 (7) 16 Below Average    Category Switching Total Correct 11 (8) 25 Average    Category Switching Accuracy 9 (8) 25 Average      Total Set Loss Errors 0 (13) 84 Above Average      Total Repetition Errors 9 (4) 2 Well Below Average       D-KEFS 20 Questions Test: Scaled Score Percentile     Total Weighted Achievement Score 15 95 Well Above Average    Initial Abstraction Score 11 63 Average       [place] Card Sorting Test [facility]): Raw Score Percentile     Categories (trials) 4 (64) >16 Within Normal Limits    Total Errors 15 54 Average    Perseverative Errors 4 86 Above Average    Non-Perseverative Errors 11 14 Below Average    Failure to Maintain Set 0 --- ---       Language:          NAB  Language Module, Form 1: T Score Percentile     Auditory Comprehension 51 54 Average    Naming 30/31 (53) 62 Average       Visuospatial/Visuoconstruction:          NAB Spatial Module, Form 1: T Score Percentile     Figure Drawing Copy 59 82 Above Average    Figure Drawing Immediate Recall 48 42 Average        Scaled Score Percentile   WAIS-IV Matrix Reasoning: 13 84 Above Average  WAIS-IV Visual Puzzles: 6 9 Below Average       Mood and Personality:      Raw Score Percentile   Beck Depression Inventory - II: 4 --- Within Normal Limits  PROMIS Anxiety Questionnaire: 13 --- None to Slight       Additional Questionnaires:      Raw Score Percentile   PROMIS Sleep Disturbance Questionnaire: 22 --- None to Slight   Informed Consent and Coding/Compliance:   Dwayne Acosta was provided with a verbal description of the nature and purpose of the present neuropsychological evaluation. Also reviewed were the foreseeable risks and/or discomforts and benefits of the procedure, limits of confidentiality, and mandatory reporting requirements of this provider. The patient was given the opportunity to ask questions and receive answers about the evaluation. Oral consent to participate was provided by the patient.   This evaluation was conducted by Christia Reading, Ph.D., licensed clinical neuropsychologist. Mr. Syzdek completed a 30-minute clinical interview, billed as one unit [phone], and 115 minutes of cognitive testing, billed as one unit [phone] and three additional units 96139. Psychometrist Milana Kidney, B.S., assisted Dr. Melvyn Novas with test administration and scoring procedures. As a separate and discrete service, Dr. Melvyn Novas spent a total of 180 minutes in interpretation and report writing, billed as one unit 96132 and two units 96133.

## 2019-02-06 NOTE — Progress Notes (Signed)
   Neuropsychology Note   Dwayne Acosta. completed 100 minutes of neuropsychological testing with technician, Milana Kidney, B.S., under the supervision of Dr. Christia Reading, Ph.D., licensed neuropsychologist. The patient did not appear overtly distressed by the testing session, per behavioral observation or via self-report to the technician. Rest breaks were offered.    In considering the patient's current level of functioning, level of presumed impairment, nature of symptoms, emotional and behavioral responses during the interview, level of literacy, and observed level of motivation/effort, a battery of tests was selected and communicated to the psychometrician.   Communication between the psychologist and technician was ongoing throughout the testing session and changes were made as deemed necessary based on patient performance on testing, technician observations and additional pertinent factors such as those listed above.   Dwayne Acosta. will return within approximately two weeks for an interactive feedback session with Dr. Melvyn Novas at which time his test performances, clinical impressions, and treatment recommendations will be reviewed in detail. The patient understands he can contact our office should he require our assistance before this time.   Full report to follow.  100 minutes were spent face-to-face with patient administering standardized tests and 15 minutes were spent scoring (technician). [CPT [id number], [id number]

## 2019-02-11 ENCOUNTER — Other Ambulatory Visit: Payer: Self-pay | Admitting: Orthopedic Surgery

## 2019-02-11 DIAGNOSIS — M25511 Pain in right shoulder: Secondary | ICD-10-CM

## 2019-02-13 ENCOUNTER — Other Ambulatory Visit: Payer: Self-pay

## 2019-02-13 ENCOUNTER — Ambulatory Visit (INDEPENDENT_AMBULATORY_CARE_PROVIDER_SITE_OTHER): Payer: 59 | Admitting: Psychology

## 2019-02-13 ENCOUNTER — Encounter: Payer: Self-pay | Admitting: Psychology

## 2019-02-13 DIAGNOSIS — R41840 Attention and concentration deficit: Secondary | ICD-10-CM

## 2019-02-13 NOTE — Progress Notes (Signed)
   Neuropsychology Feedback Session Dwayne Acosta. [facility] Department of Neurology  Reason for Referral:   Dwayne Acostais a 63 y.o. African-American male referred by Ellouise Newer, M.D.,to characterize hiscurrent cognitive functioning and assist with diagnostic clarity and treatment planning in the context of subjective cognitive dysfunction.  Feedback:   Dwayne Acosta completed a comprehensive neuropsychological evaluation on 02/06/2019. Please refer to that encounter for the full report and recommendations. Briefly, results suggested neuropsychological functioning largely within appropriate normative ranges based upon premorbid intellectual estimates. A noted weakness was exhibited across basic and complex attention. The etiology of his attentional deficits are unclear overall. However, they could certainly be related to ongoing chronic pain stemming from his prior back surgeries and ongoing disability. Polypharmacy, sleep disruptions, and symptoms of anxiety or various psychosocial stressors experienced in his day-to-day life can also compromise cognitive efficiency.  Dwayne Acosta was unaccompanied on the current telephone call. Content of the current session focused on the results of the current evaluation. Dwayne Acosta was given the opportunity to ask questions and his questions were answered. He was also encouraged to reach out should additional questions arise.     A total of 15 minutes were spent with Dwayne Acosta during the current feedback session.

## 2019-02-26 ENCOUNTER — Other Ambulatory Visit: Payer: Self-pay

## 2019-02-26 ENCOUNTER — Ambulatory Visit
Admission: RE | Admit: 2019-02-26 | Discharge: 2019-02-26 | Disposition: A | Discharge status: Home / Self Care | Payer: 59 | Source: Ambulatory Visit | Attending: Orthopedic Surgery | Admitting: Orthopedic Surgery

## 2019-02-26 DIAGNOSIS — M25511 Pain in right shoulder: Secondary | ICD-10-CM

## 2019-04-01 DIAGNOSIS — H401132 Primary open-angle glaucoma, bilateral, moderate stage: Secondary | ICD-10-CM | POA: Diagnosis not present

## 2019-04-01 DIAGNOSIS — H5213 Myopia, bilateral: Secondary | ICD-10-CM | POA: Diagnosis not present

## 2019-04-01 DIAGNOSIS — H524 Presbyopia: Secondary | ICD-10-CM | POA: Diagnosis not present

## 2019-04-22 ENCOUNTER — Ambulatory Visit: Payer: BC Managed Care – PPO | Admitting: Sports Medicine

## 2019-05-06 ENCOUNTER — Ambulatory Visit (INDEPENDENT_AMBULATORY_CARE_PROVIDER_SITE_OTHER): Payer: Medicare Other | Admitting: Sports Medicine

## 2019-05-06 ENCOUNTER — Other Ambulatory Visit: Payer: Self-pay

## 2019-05-06 ENCOUNTER — Other Ambulatory Visit: Payer: Self-pay | Admitting: Sports Medicine

## 2019-05-06 ENCOUNTER — Ambulatory Visit (INDEPENDENT_AMBULATORY_CARE_PROVIDER_SITE_OTHER): Payer: Medicare Other

## 2019-05-06 ENCOUNTER — Encounter: Payer: Self-pay | Admitting: Sports Medicine

## 2019-05-06 VITALS — Temp 96.4°F

## 2019-05-06 DIAGNOSIS — M79672 Pain in left foot: Secondary | ICD-10-CM | POA: Diagnosis not present

## 2019-05-06 DIAGNOSIS — M2042 Other hammer toe(s) (acquired), left foot: Secondary | ICD-10-CM | POA: Diagnosis not present

## 2019-05-06 DIAGNOSIS — Q742 Other congenital malformations of lower limb(s), including pelvic girdle: Secondary | ICD-10-CM | POA: Diagnosis not present

## 2019-05-06 DIAGNOSIS — M792 Neuralgia and neuritis, unspecified: Secondary | ICD-10-CM

## 2019-05-06 NOTE — Patient Instructions (Signed)
Hammer Toe  Hammer toe is a change in the shape (a deformity) of your toe. The deformity causes the middle joint of your toe to stay bent. This causes pain, especially when you are wearing shoes. Hammer toe starts gradually. At first, the toe can be straightened. Gradually over time, the deformity becomes stiff and permanent. Early treatments to keep the toe straight may relieve pain. As the deformity becomes stiff and permanent, surgery may be needed to straighten the toe. What are the causes? Hammer toe is caused by abnormal bending of the toe joint that is closest to your foot. It happens gradually over time. This pulls on the muscles and connections (tendons) of the toe joint, making them weak and stiff. It is often related to wearing shoes that are too short or narrow and do not let your toes straighten. What increases the risk? You may be at greater risk for hammer toe if you:  Are male.  Are older.  Wear shoes that are too small.  Wear high-heeled shoes that pinch your toes.  Are a ballet dancer.  Have a second toe that is longer than your big toe (first toe).  Injure your foot or toe.  Have arthritis.  Have a family history of hammer toe.  Have a nerve or muscle disorder. What are the signs or symptoms? The main symptoms of this condition are pain and deformity of the toe. The pain is worse when wearing shoes, walking, or running. Other symptoms may include:  Corns or calluses over the bent part of the toe or between the toes.  Redness and a burning feeling on the toe.  An open sore that forms on the top of the toe.  Not being able to straighten the toe. How is this diagnosed? This condition is diagnosed based on your symptoms and a physical exam. During the exam, your health care provider will try to straighten your toe to see how stiff the deformity is. You may also have tests, such as:  A blood test to check for rheumatoid arthritis.  An X-ray to show how  severe the deformity is. How is this treated? Treatment for this condition will depend on how stiff the deformity is. Surgery is often needed. However, sometimes a hammer toe can be straightened without surgery. Treatments that do not involve surgery include:  Taping the toe into a straightened position.  Using pads and cushions to protect the toe (orthotics).  Wearing shoes that provide enough room for the toes.  Doing toe-stretching exercises at home.  Taking an NSAID to reduce pain and swelling. If these treatments do not help or the toe cannot be straightened, surgery is the next option. The most common surgeries used to straighten a hammer toe include:  Arthroplasty. In this procedure, part of the joint is removed, and that allows the toe to straighten.  Fusion. In this procedure, cartilage between the two bones of the joint is taken out and the bones are fused together into one longer bone.  Implantation. In this procedure, part of the bone is removed and replaced with an implant to let the toe move again.  Flexor tendon transfer. In this procedure, the tendons that curl the toes down (flexor tendons) are repositioned. Follow these instructions at home:  Take over-the-counter and prescription medicines only as told by your health care provider.  Do toe straightening and stretching exercises as told by your health care provider.  Keep all follow-up visits as told by your health care   provider. This is important. How is this prevented?  Wear shoes that give your toes enough room and do not cause pain.  Do not wear high-heeled shoes. Contact a health care provider if:  Your pain gets worse.  Your toe becomes red or swollen.  You develop an open sore on your toe. This information is not intended to replace advice given to you by your health care provider. Make sure you discuss any questions you have with your health care provider. Document Revised: 02/02/2017 Document  Reviewed: 06/16/2015 Elsevier Patient Education  2020 Elsevier Inc.  

## 2019-05-06 NOTE — Progress Notes (Signed)
Subjective: Dwayne Acosta. is a 64 y.o. male patient who presents to office for evaluation of left greater than right foot pain especially at the second toe reports that he is noticed his toes changing and curling down and over the years has had tingling and burning to the toes admits to a history of seeing a previous foot doctor who recommended surgery at that time but decided not to go forward with doing it and reports that he has tried changing shoes cushion insoles without any relief patient also had back surgery and reports that his back is doing better but still has issues with pains in the toes and is concerned about the thickening of the toenails and the pain especially when he is in shoe at the left second toe.  Review of Systems  All other systems reviewed and are negative.    Patient Active Problem List   Diagnosis Date Noted  . Spinal stenosis of lumbar region 12/05/2016    Class: Chronic  . Spinal stenosis of lumbar region with neurogenic claudication 12/05/2016  . Degenerative disc disease, cervical 11/21/2016  . Spondylosis without myelopathy or radiculopathy, cervical region 11/21/2016  . Acute appendicitis 07/20/2016  . 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 Increased frequency of urination 02/26/2015  . Urinary urgency 02/26/2015  . Conjunctivitis, chemical 04/16/2013  . Dry eye syndrome 12/12/2012  . 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 NS (nuclear sclerosis) 10/23/2012  . 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 diclofenac (VOLTAREN) 50 MG EC tablet Take 1 tablet (50 mg total) by mouth 3 (three) times daily. (Patient taking differently: Take 50 mg by mouth 3 (three) times daily as needed. ) 90 tablet 6  . diclofenac (VOLTAREN) 75 MG EC tablet Take 75 mg  by mouth 2 (two) times daily.    . fluticasone (FLONASE) 50 MCG/ACT nasal spray Place 2 sprays into both nostrils at bedtime.    . gabapentin (NEURONTIN) 100 MG capsule TAKE 1 CAPSULE BY MOUTH THREE TIMES A DAY (Patient taking differently: Take 100 mg by mouth 2 (two) times daily. ) 30 capsule 6  . latanoprost (XALATAN) 0.005 % ophthalmic solution Place 1 drop into both eyes at bedtime.     . methocarbamol (ROBAXIN) 750 MG tablet TAKE 1 TABLET BY MOUTH EVERY 8 HOURS AS NEEDED FOR MUSCLE SPASMS 50 tablet 0  . minoxidil (ROGAINE) 2 % external solution Apply topically 1 day or 1 dose.    Marland Kitchen MOVANTIK 25 MG TABS tablet TAKE 1 TABLET BY MOUTH EVERY DAY (Patient taking differently: Take 25 mg by mouth as needed. ) 20 tablet 0  . Naproxen Sodium (ALEVE PO) Take by mouth as needed.    . sildenafil (REVATIO) 20 MG tablet TAKE 2-5 TABLETS BY MOUTH ONCE A DAY AS NEEDED FOR SEXUAL ACTIVITY  3  . tizanidine (ZANAFLEX) 2 MG capsule TAKE 1 CAPSULE (2 MG TOTAL) BY MOUTH 3 (THREE) TIMES DAILY AS NEEDED FOR MUSCLE SPASMS. 270 capsule 6  . VESICARE 10 MG tablet Take 10 mg by mouth at bedtime.     . traMADol (ULTRAM) 50 MG tablet TAKE 1 TO 2 TABLETS EVERY 6 HOURS AS NEEDED FOR PAIN (Patient not taking: Reported on 05/06/2019) 20 tablet 0   Current Facility-Administered Medications on File Prior to  Visit  Medication Dose Route Frequency Provider Last Rate Last Admin  . fentaNYL (SUBLIMAZE) injection    Anesthesia Intra-op Gaylene Brooks, CRNA   50 mcg at 11/21/16 [phone]  . midazolam (VERSED) 5 MG/5ML injection    Anesthesia Intra-op Gaylene Brooks, CRNA   2 mg at 11/21/16 0655  . polyethylene glycol (MIRALAX / GLYCOLAX) packet 17 g  17 g Oral Daily Jessy Oto, MD        No Known Allergies  Objective:  General: Alert and oriented x3 in no acute distress  Dermatology: Minimal keratotic lesion over the PIPJ left greater than right fifth toe all other toes no keratotic lesions, no webspace macerations, no  ecchymosis bilateral, all nails x 10 are well manicured with left second third and fourth toenails thickened likely mechanical due to hammertoe deformity.  Vascular: Dorsalis Pedis 2/4 and Posterior Tibial pedal pulses 1/4, Capillary Fill Time 3 seconds,(+) pedal hair growth bilateral, no edema bilateral lower extremities, Temperature gradient within normal limits.  Neurology: Johney Maine sensation intact via light touch bilateral, subjective tingling to both foot history of spinal surgery on gabapentin.  Musculoskeletal: Semi-flexible hammertoes 2-5 with Mild tenderness with palpation at PIPJ left second toe with significant length and nail changes at the lesser toes on the left foot,  ankle, Subtalar, Midtarsal, and MTPJ joint range of motion is within normal limits, there is no 1st ray hypermobility noted bilateral, No bunion deformity noted bilateral. No pain with calf compression bilateral.  Strength within normal limits in all groups bilateral.   Gait: Unassisted, mildly-antalgic.  Xrays Left Foot    Impression: Contracted second third and fourth toes with long second toe noted on x-ray       Assessment and Plan: Problem List Items Addressed This Visit    None    Visit Diagnoses    Left foot pain    -  Primary   Hammer toe of left foot       Long toe       Neuritis           -Complete examination performed -Xrays reviewed -Discussed treatment options -Dispensed toe caps instructed patient on proper use -Advised patient may benefit from surgery to have the toes shortened and K wires placed especially at the left second third and fourth toes -Patient to return to office for surgery consult or sooner if condition worsens.  [name], DPM

## 2019-05-07 ENCOUNTER — Other Ambulatory Visit: Payer: Self-pay | Admitting: Radiology

## 2019-05-08 MED ORDER — DICLOFENAC SODIUM 50 MG PO TBEC: 50.0000 mg | DELAYED_RELEASE_TABLET | Freq: Three times a day (TID) | ORAL | 6 refills | Status: DC | PRN

## 2019-05-13 DIAGNOSIS — H401132 Primary open-angle glaucoma, bilateral, moderate stage: Secondary | ICD-10-CM | POA: Diagnosis not present

## 2019-05-20 ENCOUNTER — Other Ambulatory Visit: Payer: Self-pay

## 2019-05-20 ENCOUNTER — Encounter: Payer: Self-pay | Admitting: Sports Medicine

## 2019-05-20 ENCOUNTER — Ambulatory Visit (INDEPENDENT_AMBULATORY_CARE_PROVIDER_SITE_OTHER): Payer: BC Managed Care – PPO | Admitting: Sports Medicine

## 2019-05-20 ENCOUNTER — Encounter: Payer: Self-pay | Admitting: Podiatry

## 2019-05-20 VITALS — Temp 97.3°F

## 2019-05-20 DIAGNOSIS — Q742 Other congenital malformations of lower limb(s), including pelvic girdle: Secondary | ICD-10-CM

## 2019-05-20 DIAGNOSIS — M2042 Other hammer toe(s) (acquired), left foot: Secondary | ICD-10-CM | POA: Diagnosis not present

## 2019-05-20 DIAGNOSIS — M79672 Pain in left foot: Secondary | ICD-10-CM

## 2019-05-20 NOTE — Progress Notes (Signed)
Subjective: Dwayne Acosta. is a 64 y.o. male patient who returns to office for follow up evaluation of L>R foot pain. Patient reports that pain is doing better, mild pain that comes and goes especially on left toes with concern of toe deformity and nail thickness/discoloration. No new issues.   Patient Active Problem List   Diagnosis Date Noted  . Spinal stenosis of lumbar region 12/05/2016    Class: Chronic  . Spinal stenosis of lumbar region with neurogenic claudication 12/05/2016  . Degenerative disc disease, cervical 11/21/2016  . Spondylosis without myelopathy or radiculopathy, cervical region 11/21/2016  . Acute appendicitis 07/20/2016  . 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 Increased frequency of urination 02/26/2015  . Urinary urgency 02/26/2015  . Conjunctivitis, chemical 04/16/2013  . Dry eye syndrome 12/12/2012  . 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 NS (nuclear sclerosis) 10/23/2012  . 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 diclofenac (VOLTAREN) 50 MG EC tablet Take 1 tablet (50 mg total) by mouth 3 (three) times daily as needed. 90 tablet 6  . Dorzolamide HCl-Timolol Mal PF 2-0.5 % SOLN Apply 1 drop to eye 2 (two) times daily.    . fluticasone (FLONASE) 50 MCG/ACT nasal spray Place 2 sprays into both nostrils at bedtime.    . gabapentin (NEURONTIN) 100 MG capsule TAKE 1 CAPSULE BY MOUTH THREE TIMES A DAY (Patient taking differently: Take 100 mg by mouth 2 (two) times daily. ) 30 capsule 6  . methocarbamol (ROBAXIN) 750 MG tablet TAKE 1 TABLET BY MOUTH EVERY 8 HOURS AS NEEDED FOR MUSCLE SPASMS 50 tablet 0  . minoxidil (ROGAINE) 2 % external solution Apply topically 1 day or 1 dose.    Marland Kitchen MOVANTIK 25 MG TABS tablet TAKE 1 TABLET BY MOUTH EVERY DAY (Patient  taking differently: Take 25 mg by mouth as needed. ) 20 tablet 0  . Naproxen Sodium (ALEVE PO) Take by mouth as needed.    . sildenafil (REVATIO) 20 MG tablet TAKE 2-5 TABLETS BY MOUTH ONCE A DAY AS NEEDED FOR SEXUAL ACTIVITY  3  . tizanidine (ZANAFLEX) 2 MG capsule TAKE 1 CAPSULE (2 MG TOTAL) BY MOUTH 3 (THREE) TIMES DAILY AS NEEDED FOR MUSCLE SPASMS. 270 capsule 6  . traMADol (ULTRAM) 50 MG tablet TAKE 1 TO 2 TABLETS EVERY 6 HOURS AS NEEDED FOR PAIN 20 tablet 0  . VESICARE 10 MG tablet Take 10 mg by mouth at bedtime.      Current Facility-Administered Medications on File Prior to Visit  Medication Dose Route Frequency Provider Last Rate Last Admin  . fentaNYL (SUBLIMAZE) injection    Anesthesia Intra-op Gaylene Brooks, CRNA   50 mcg at 11/21/16 [phone]  . midazolam (VERSED) 5 MG/5ML injection    Anesthesia Intra-op Gaylene Brooks, CRNA   2 mg at 11/21/16 0655  . polyethylene glycol (MIRALAX / GLYCOLAX) packet 17 g  17 g Oral Daily Jessy Oto, MD        No Known Allergies   Social History   Socioeconomic History  . Marital status: Married    Spouse name: Santiago Glad  . Number of children: 1  . Years of education: 15  . Highest education level: Bachelor's degree (e.g., BA, AB, BS)  Occupational History  . Occupation: Disability  Tobacco Use  . Smoking status: Never Smoker  . Smokeless tobacco: Never Used  Substance and Sexual Activity  . Alcohol use: Yes    Alcohol/week: 0.0 standard drinks    Comment: occ-gin or vodka 1-2 week  . Drug use: No  . Sexual activity: Not on file  Other Topics Concern  . Not on file  Social History Narrative   Patient lives at home with his family.   Caffeine Use: very little   Right handed    Social Determinants of Health   Financial Resource Strain:   . Difficulty of Paying Living Expenses:   Food Insecurity:   . Worried About Charity fundraiser in the Last Year:   . Arboriculturist in the Last Year:   Transportation Needs:   . Lexicographer (Medical):   Marland Kitchen Lack of Transportation (Non-Medical):   Physical Activity:   . Days of Exercise per Week:   . Minutes of Exercise per Session:   Stress:   . Feeling of Stress :   Social Connections:   . Frequency of Communication with Friends and Family:   . Frequency of Social Gatherings with Friends and Family:   . Attends Religious Services:   . Active Member of Clubs or Organizations:   . Attends Archivist Meetings:   Marland Kitchen Marital Status:     Family History  Problem Relation Age of Onset  . High blood pressure Mother   . Heart attack Father   . Parkinson's disease Father   . Stroke Paternal Grandmother   . Bone cancer Maternal Grandfather     Past Surgical History:  Procedure Laterality Date  . APPENDECTOMY    . BACK SURGERY  2005  . FOOT SURGERY Left    bunion  . LAPAROSCOPIC APPENDECTOMY N/A 07/20/2016   Procedure: APPENDECTOMY LAPAROSCOPIC;  Surgeon: Armandina Gemma, MD;  Location: [facility];  Service: General;  Laterality: N/A;  . LUMBAR FUSION  12/05/2016  . ROTATOR CUFF REPAIR Bilateral      Objective:  General: Alert and oriented x3 in no acute distress  Dermatology: Minimal keratotic lesion over the PIPJ left greater than right fifth toe all other toes no keratotic lesions, no webspace macerations, no ecchymosis bilateral, all nails x 10 are well manicured with left second third and fourth toenails thickened likely mechanical due to hammertoe deformity.  Vascular: Dorsalis Pedis 2/4 and Posterior Tibial pedal pulses 1/4, Capillary Fill Time 3 seconds,(+) pedal hair growth bilateral, no edema bilateral lower extremities, Temperature gradient within normal limits.  Neurology: Johney Maine sensation intact via light touch bilateral, subjective tingling to both foot history of spinal surgery on gabapentin.  Musculoskeletal: Semi-flexible hammertoes 2-5 with Mild tenderness with palpation at PIPJ left second toe with significant length and nail changes at  the lesser toes on the left foot worse at toes 2,3,4,  ankle, Subtalar, Midtarsal, and MTPJ joint range of motion is within normal limits, there is no 1st ray hypermobility noted bilateral, No bunion deformity noted bilateral. No pain with calf compression bilateral.  Strength within normal limits in all groups bilateral.   Assessment and Plan: Problem List Items Addressed This Visit    None    Visit Diagnoses    Hammer toe of left foot    -  Primary   Long toe       Left foot pain           -Complete examination performed -Previous Xrays reviewed -Re-Discussed treatment  options -Patient opt for surgical management. Consent obtained for left second third and fourth toes hammertoe repair with kwires. Pre and Post op course explained. Risks, benefits, alternatives explained. No guarantees given or implied. Surgical booking slip submitted and provided patient with Surgical packet and info for [facility] -Advised patient that changing the length of the toes should help to decrease pressure on the toenails but it may take >1 year to see any improvement with the nails -To dispense CAM Walker/surgical shoe to use post op at [facility] and crutches  -Meanwhile may continue with toe caps bilateral  -Patient to return to office after surgery or sooner if condition worsens.  [name], DPM

## 2019-05-20 NOTE — Patient Instructions (Signed)
Pre-Operative Instructions  Congratulations, you have decided to take an important step towards improving your quality of life.  You can be assured that the doctors and staff at [facility] will be with you every step of the way.  Here are some important things you should know:  1. Plan to be at the surgery center/hospital at least 1 (one) hour prior to your scheduled time, unless otherwise directed by the surgical center/hospital staff.  You must have a responsible adult accompany you, remain during the surgery and drive you home.  Make sure you have directions to the surgical center/hospital to ensure you arrive on time. 2. If you are having surgery at [facility] or  hospitals, you will need a copy of your medical history and physical form from your family physician within one month prior to the date of surgery. We will give you a form for your primary physician to complete.  3. We make every effort to accommodate the date you request for surgery.  However, there are times where surgery dates or times have to be moved.  We will contact you as soon as possible if a change in schedule is required.   4. No aspirin/ibuprofen for one week before surgery.  If you are on aspirin, any non-steroidal anti-inflammatory medications (Mobic, Aleve, Ibuprofen) should not be taken seven (7) days prior to your surgery.  You make take Tylenol for pain prior to surgery.  5. Medications - If you are taking daily heart and blood pressure medications, seizure, reflux, allergy, asthma, anxiety, pain or diabetes medications, make sure you notify the surgery center/hospital before the day of surgery so they can tell you which medications you should take or avoid the day of surgery. 6. No food or drink after midnight the night before surgery unless directed otherwise by surgical center/hospital staff. 7. No alcoholic beverages 24-hours prior to surgery.  No smoking 24-hours prior or 24-hours after  surgery. 8. Wear loose pants or shorts. They should be loose enough to fit over bandages, boots, and casts. 9. Don't wear slip-on shoes. Sneakers are preferred. 10. Bring your boot with you to the surgery center/hospital.  Also bring crutches or a walker if your physician has prescribed it for you.  If you do not have this equipment, it will be provided for you after surgery. 11. If you have not been contacted by the surgery center/hospital by the day before your surgery, call to confirm the date and time of your surgery. 12. Leave-time from work may vary depending on the type of surgery you have.  Appropriate arrangements should be made prior to surgery with your employer. 13. Prescriptions will be provided immediately following surgery by your doctor.  Fill these as soon as possible after surgery and take the medication as directed. Pain medications will not be refilled on weekends and must be approved by the doctor. 14. Remove nail polish on the operative foot and avoid getting pedicures prior to surgery. 15. Wash the night before surgery.  The night before surgery wash the foot and leg well with water and the antibacterial soap provided. Be sure to pay special attention to beneath the toenails and in between the toes.  Wash for at least three (3) minutes. Rinse thoroughly with water and dry well with a towel.  Perform this wash unless told not to do so by your physician.  Enclosed: 1 Ice pack (please put in freezer the night before surgery)   1 Hibiclens skin cleaner     Pre-op instructions  If you have any questions regarding the instructions, please do not hesitate to call our office.  [place]: [address], [place], [place] [address] -- [phone]  [place]: [address] [place], [place] [address] -- [phone]  [facility]: [address], [place],  [address] -- [phone]   Website: [email or web address] 

## 2019-07-06 ENCOUNTER — Other Ambulatory Visit: Payer: Self-pay | Admitting: Sports Medicine

## 2019-07-06 DIAGNOSIS — Z9889 Other specified postprocedural states: Secondary | ICD-10-CM

## 2019-07-06 NOTE — Progress Notes (Signed)
Post op meds entered 

## 2019-07-07 ENCOUNTER — Encounter: Payer: Self-pay | Admitting: Sports Medicine

## 2019-07-07 DIAGNOSIS — M2042 Other hammer toe(s) (acquired), left foot: Secondary | ICD-10-CM | POA: Diagnosis not present

## 2019-07-07 DIAGNOSIS — R031 Nonspecific low blood-pressure reading: Secondary | ICD-10-CM | POA: Diagnosis not present

## 2019-07-07 MED ORDER — DOCUSATE SODIUM 100 MG PO CAPS: 100.0000 mg | ORAL_CAPSULE | Freq: Two times a day (BID) | ORAL | 0 refills | Status: DC

## 2019-07-07 MED ORDER — PROMETHAZINE HCL 25 MG PO TABS: 25.0000 mg | ORAL_TABLET | Freq: Three times a day (TID) | ORAL | 0 refills | Status: DC | PRN

## 2019-07-07 MED ORDER — ACETAMINOPHEN 500 MG PO TABS: 500.0000 mg | ORAL_TABLET | Freq: Four times a day (QID) | ORAL | 0 refills | Status: DC | PRN

## 2019-07-07 MED ORDER — TRAMADOL HCL 50 MG PO TABS: ORAL_TABLET | ORAL | 0 refills | Status: DC

## 2019-07-08 ENCOUNTER — Telehealth: Payer: Self-pay | Admitting: Sports Medicine

## 2019-07-08 NOTE — Telephone Encounter (Signed)
Post op check phone call made to patient. Patient reports that he is a little sore this morning and reports that he had to adjust the boot. Reports that he had some throbbing when he got up on his foot but otherwise is doing ok. Patient asks when can he start to do more, I advised him to still limit his activities only up for food and bathroom. He also asks when he can wean off pain medication, I advised for him to wean or stop when he has no pain however on average most patient require pain medication at least the 1st 72 hours. I advised patient to continue with rest, ice, elevation, and taking his meds as instructed. Advised patient to call the office if he has any ?s this week and to continue to take it easy until he is seen next week in office. -Dr. Cannon Kettle

## 2019-07-10 ENCOUNTER — Telehealth: Payer: Self-pay | Admitting: *Deleted

## 2019-07-10 NOTE — Telephone Encounter (Signed)
I spoke with pt and informed that he did need to sleep with the boot on and rest wit the foot 6" above the hip. Pt states about 4 in the morning he had to call the on-call doctor because the foot had swelled and the doctor told him to remove the boot and take rearrange the dressing and he did and the toe felt much better. I told pt to sleep in the boot, and walk in the boot, not to have the surgery foot below his heart more than 15 minutes/hour and pt states understanding.

## 2019-07-10 NOTE — Telephone Encounter (Signed)
Pt states he had surgery 07/07/2019 and he thought he could sleep with out the boot, and did he have to rest with the foot 6" above the hip still.

## 2019-07-10 NOTE — Telephone Encounter (Signed)
Pt states he had surgery and feels loopy. I told pt part was from the anesthesia lag effect that may last 24-72hours and if he had started his pain medication that would cause him to be loopy.

## 2019-07-15 ENCOUNTER — Encounter: Payer: Self-pay | Admitting: Sports Medicine

## 2019-07-15 ENCOUNTER — Other Ambulatory Visit: Payer: Self-pay

## 2019-07-15 ENCOUNTER — Ambulatory Visit (INDEPENDENT_AMBULATORY_CARE_PROVIDER_SITE_OTHER): Payer: Medicare Other | Admitting: Sports Medicine

## 2019-07-15 ENCOUNTER — Ambulatory Visit (INDEPENDENT_AMBULATORY_CARE_PROVIDER_SITE_OTHER): Payer: BC Managed Care – PPO

## 2019-07-15 VITALS — BP 117/76 | HR 60 | Temp 97.8°F | Resp 16

## 2019-07-15 DIAGNOSIS — Z9889 Other specified postprocedural states: Secondary | ICD-10-CM

## 2019-07-15 DIAGNOSIS — M2042 Other hammer toe(s) (acquired), left foot: Secondary | ICD-10-CM

## 2019-07-15 DIAGNOSIS — M79672 Pain in left foot: Secondary | ICD-10-CM

## 2019-07-15 DIAGNOSIS — Q742 Other congenital malformations of lower limb(s), including pelvic girdle: Secondary | ICD-10-CM

## 2019-07-15 NOTE — Progress Notes (Signed)
Subjective: Dwayne Acosta. is a 64 y.o. male patient seen today in office for POV  1 (DOS 07/07/2019), S/P left second through fourth hammertoe repair.  Patient admits pain at surgical site 5-6 out of 10 thinks that the bandages were rubbing in between the toes and reports that his boot is too small reports that he had a problem on Friday night noticing some blood running out of his bandage ended up seeing Dr. On call on Saturday who changed his bandages and was given a smaller boot but feels like the boot does not support his foot properly, denies calf pain, denies headache, chest pain, shortness of breath, nausea, vomiting, fever, or chills.  No other issues noted.   Patient Active Problem List   Diagnosis Date Noted  . Spinal stenosis of lumbar region 12/05/2016    Class: Chronic  . Spinal stenosis of lumbar region with neurogenic claudication 12/05/2016  . Degenerative disc disease, cervical 11/21/2016  . Spondylosis without myelopathy or radiculopathy, cervical region 11/21/2016  . Acute appendicitis 07/20/2016  . 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 Increased frequency of urination 02/26/2015  . Urinary urgency 02/26/2015  . Conjunctivitis, chemical 04/16/2013  . Dry eye syndrome 12/12/2012  . 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 NS (nuclear sclerosis) 10/23/2012  . 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 acetaminophen (TYLENOL) 500 MG tablet Take 1 tablet (500 mg total) by mouth every 6 (six) hours as needed for mild pain. Not relieved by Ultram 30 tablet 0  . diclofenac (VOLTAREN) 50 MG EC tablet Take 1 tablet (50 mg total) by mouth 3 (three) times daily as needed. 90 tablet 6  . docusate sodium (COLACE) 100 MG capsule Take 1 capsule (100 mg total) by mouth 2 (two)  times daily. 10 capsule 0  . Dorzolamide HCl-Timolol Mal PF 2-0.5 % SOLN Apply 1 drop to eye 2 (two) times daily.    . fluticasone (FLONASE) 50 MCG/ACT nasal spray Place 2 sprays into both nostrils at bedtime.    . gabapentin (NEURONTIN) 100 MG capsule TAKE 1 CAPSULE BY MOUTH THREE TIMES A DAY (Patient taking differently: Take 100 mg by mouth 2 (two) times daily. ) 30 capsule 6  . methocarbamol (ROBAXIN) 750 MG tablet TAKE 1 TABLET BY MOUTH EVERY 8 HOURS AS NEEDED FOR MUSCLE SPASMS 50 tablet 0  . minoxidil (ROGAINE) 2 % external solution Apply topically 1 day or 1 dose.    Marland Kitchen MOVANTIK 25 MG TABS tablet TAKE 1 TABLET BY MOUTH EVERY DAY (Patient taking differently: Take 25 mg by mouth as needed. ) 20 tablet 0  . Naproxen Sodium (ALEVE PO) Take by mouth as needed.    . promethazine (PHENERGAN) 25 MG tablet Take 1 tablet (25 mg total) by mouth every 8 (eight) hours as needed for nausea or vomiting. 20 tablet 0  . sildenafil (REVATIO) 20 MG tablet TAKE 2-5 TABLETS BY MOUTH ONCE A DAY AS NEEDED FOR SEXUAL ACTIVITY  3  . tizanidine (ZANAFLEX) 2 MG capsule TAKE 1 CAPSULE (2 MG TOTAL) BY MOUTH 3 (THREE) TIMES DAILY AS NEEDED FOR MUSCLE SPASMS. 270 capsule 6  . traMADol (ULTRAM) 50 MG tablet TAKE 1 TO 2 TABLETS EVERY 6 HOURS AS NEEDED FOR PAIN 28 tablet 0  . VESICARE 10 MG  tablet Take 10 mg by mouth at bedtime.      Current Facility-Administered Medications on File Prior to Visit  Medication Dose Route Frequency Provider Last Rate Last Admin  . fentaNYL (SUBLIMAZE) injection    Anesthesia Intra-op Gaylene Brooks, CRNA   50 mcg at 11/21/16 [phone]  . midazolam (VERSED) 5 MG/5ML injection    Anesthesia Intra-op Gaylene Brooks, CRNA   2 mg at 11/21/16 0655  . polyethylene glycol (MIRALAX / GLYCOLAX) packet 17 g  17 g Oral Daily Jessy Oto, MD        No Known Allergies  Objective: There were no vitals filed for this visit.  General: No acute distress, AAOx3  Left foot: K wires and sutures intact  with no gapping or dehiscence at surgical site, mild swelling to second through fourth toes no erythema, no warmth, no active drainage, no signs of infection noted, mild blood noted that is dry in nature in between toes, mild maceration noted at the fourth webspace, capillary fill time <3 seconds in all digits, gross sensation present via light touch to left foot. No pain with calf compression.   Post Op Xray, left foot: Second and fourth toes excellent alignment and position. Osteotomy site healing. Hardware intact. Soft tissue swelling within normal limits for post op status.   Assessment and Plan:  Problem List Items Addressed This Visit    None    Visit Diagnoses    Hammer toe of left foot    -  Primary   Relevant Orders   DG Foot Complete Left (Completed)   S/P foot surgery, left       Long toe       Left foot pain           -Patient seen and evaluated -X-rays reviewed -Advised patient that some amount of bleeding is normal however he must be very careful not to bump or hit the toes and not to overdo it even though he is walking in a cam boot -Applied Betadine and dry sterile dressing to surgical site left foot secured with Coban wrap and stockinet  -Advised patient to make sure to keep dressings clean, dry, and intact to left surgical foot but may remove stockinette as needed -Advised patient to continue with cam boot on left foot for protected weightbearing -Advised patient to limit activity to necessity  -Advised patient to ice and elevate as necessary  -Continue with Ultram for pain -Will plan for possible suture removal at next office visit. In the meantime, patient to call office if any issues or problems arise.   [name], DPM

## 2019-07-22 ENCOUNTER — Other Ambulatory Visit: Payer: Self-pay

## 2019-07-22 ENCOUNTER — Ambulatory Visit (INDEPENDENT_AMBULATORY_CARE_PROVIDER_SITE_OTHER): Payer: Medicare Other | Admitting: Sports Medicine

## 2019-07-22 ENCOUNTER — Encounter: Payer: Self-pay | Admitting: Sports Medicine

## 2019-07-22 VITALS — Temp 97.3°F

## 2019-07-22 DIAGNOSIS — M79672 Pain in left foot: Secondary | ICD-10-CM

## 2019-07-22 DIAGNOSIS — Q742 Other congenital malformations of lower limb(s), including pelvic girdle: Secondary | ICD-10-CM

## 2019-07-22 DIAGNOSIS — M2042 Other hammer toe(s) (acquired), left foot: Secondary | ICD-10-CM

## 2019-07-22 DIAGNOSIS — Z9889 Other specified postprocedural states: Secondary | ICD-10-CM

## 2019-07-22 NOTE — Progress Notes (Signed)
Subjective: Dwayne Hi. is a 64 y.o. male patient seen today in office for POV  2 (DOS 07/07/2019), S/P left second through fourth hammertoe repair.  Patient reports that pain is getting better but had some bad itching at 4-5 toes and put betadine in between which has helped, denies calf pain, denies headache, chest pain, shortness of breath, nausea, vomiting, fever, or chills.  No other issues noted.   Patient Active Problem List   Diagnosis Date Noted  . Spinal stenosis of lumbar region 12/05/2016    Class: Chronic  . Spinal stenosis of lumbar region with neurogenic claudication 12/05/2016  . Degenerative disc disease, cervical 11/21/2016  . Spondylosis without myelopathy or radiculopathy, cervical region 11/21/2016  . Acute appendicitis 07/20/2016  . 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 Increased frequency of urination 02/26/2015  . Urinary urgency 02/26/2015  . Conjunctivitis, chemical 04/16/2013  . Dry eye syndrome 12/12/2012  . 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 NS (nuclear sclerosis) 10/23/2012  . 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 acetaminophen (TYLENOL) 500 MG tablet Take 1 tablet (500 mg total) by mouth every 6 (six) hours as needed for mild pain. Not relieved by Ultram 30 tablet 0  . diclofenac (VOLTAREN) 50 MG EC tablet Take 1 tablet (50 mg total) by mouth 3 (three) times daily as needed. 90 tablet 6  . docusate sodium (COLACE) 100 MG capsule Take 1 capsule (100 mg total) by mouth 2 (two) times daily. 10 capsule 0  . Dorzolamide HCl-Timolol Mal PF 2-0.5 % SOLN Apply 1 drop to eye 2 (two) times daily.    . fluticasone (FLONASE) 50 MCG/ACT nasal spray Place 2 sprays into both nostrils at bedtime.    . gabapentin (NEURONTIN) 100 MG capsule TAKE 1 CAPSULE  BY MOUTH THREE TIMES A DAY (Patient taking differently: Take 100 mg by mouth 2 (two) times daily. ) 30 capsule 6  . methocarbamol (ROBAXIN) 750 MG tablet TAKE 1 TABLET BY MOUTH EVERY 8 HOURS AS NEEDED FOR MUSCLE SPASMS 50 tablet 0  . minoxidil (ROGAINE) 2 % external solution Apply topically 1 day or 1 dose.    Marland Kitchen MOVANTIK 25 MG TABS tablet TAKE 1 TABLET BY MOUTH EVERY DAY (Patient taking differently: Take 25 mg by mouth as needed. ) 20 tablet 0  . Naproxen Sodium (ALEVE PO) Take by mouth as needed.    . promethazine (PHENERGAN) 25 MG tablet Take 1 tablet (25 mg total) by mouth every 8 (eight) hours as needed for nausea or vomiting. 20 tablet 0  . sildenafil (REVATIO) 20 MG tablet TAKE 2-5 TABLETS BY MOUTH ONCE A DAY AS NEEDED FOR SEXUAL ACTIVITY  3  . tizanidine (ZANAFLEX) 2 MG capsule TAKE 1 CAPSULE (2 MG TOTAL) BY MOUTH 3 (THREE) TIMES DAILY AS NEEDED FOR MUSCLE SPASMS. 270 capsule 6  . traMADol (ULTRAM) 50 MG tablet TAKE 1 TO 2 TABLETS EVERY 6 HOURS AS NEEDED FOR PAIN 28 tablet 0  . VESICARE 10 MG tablet Take 10 mg by mouth at bedtime.      Current Facility-Administered Medications on File Prior to Visit  Medication Dose Route Frequency Provider Last Rate Last Admin  . fentaNYL (SUBLIMAZE) injection    Anesthesia Intra-op Gaylene Brooks, CRNA   50 mcg at 11/21/16  [id number]  . midazolam (VERSED) 5 MG/5ML injection    Anesthesia Intra-op Gaylene Brooks, CRNA   2 mg at 11/21/16 0655  . polyethylene glycol (MIRALAX / GLYCOLAX) packet 17 g  17 g Oral Daily Jessy Oto, MD        No Known Allergies  Objective: There were no vitals filed for this visit.  General: No acute distress, AAOx3  Left foot: K wires and sutures intact with no gapping or dehiscence at surgical site, mild swelling to second through fourth toes no erythema, no warmth, no active drainage, no signs of infection noted, mild blood noted that is dry in nature in between toes, mild maceration noted at the fourth webspace,  capillary fill time <3 seconds in all digits, gross sensation present via light touch to left foot. No pain with calf compression.   Assessment and Plan:  Problem List Items Addressed This Visit    None    Visit Diagnoses    Hammer toe of left foot    -  Primary   S/P foot surgery, left       Long toe       Left foot pain           -Patient seen and evaluated -All sutures were removed this visit -Applied Betadine and dry sterile dressing to surgical site left foot secured with Coban wrap and stockinet  -Advised patient to make sure to keep dressings clean, dry, and intact to left surgical foot -Advised patient to continue with cam boot on left foot for protected weightbearing like previous  -Advised patient to limit activity to necessity  -Advised patient to ice and elevate as necessary  -Continue with Ultram for pain like previous  -Will plan for dressing change at next office visit. In the meantime, patient to call office if any issues or problems arise.   [name], DPM

## 2019-07-24 ENCOUNTER — Ambulatory Visit (INDEPENDENT_AMBULATORY_CARE_PROVIDER_SITE_OTHER): Payer: BC Managed Care – PPO | Admitting: Specialist

## 2019-07-24 ENCOUNTER — Other Ambulatory Visit: Payer: Self-pay

## 2019-07-24 ENCOUNTER — Ambulatory Visit: Payer: Self-pay

## 2019-07-24 ENCOUNTER — Encounter: Payer: Self-pay | Admitting: Specialist

## 2019-07-24 VITALS — BP 118/81 | HR 55 | Ht 69.0 in | Wt 180.0 lb

## 2019-07-24 DIAGNOSIS — M47816 Spondylosis without myelopathy or radiculopathy, lumbar region: Secondary | ICD-10-CM

## 2019-07-24 DIAGNOSIS — Z981 Arthrodesis status: Secondary | ICD-10-CM | POA: Diagnosis not present

## 2019-07-24 DIAGNOSIS — M542 Cervicalgia: Secondary | ICD-10-CM | POA: Diagnosis not present

## 2019-07-24 DIAGNOSIS — M4326 Fusion of spine, lumbar region: Secondary | ICD-10-CM | POA: Diagnosis not present

## 2019-07-24 DIAGNOSIS — M5136 Other intervertebral disc degeneration, lumbar region: Secondary | ICD-10-CM

## 2019-07-24 NOTE — Patient Instructions (Signed)
Avoid overhead lifting and overhead use of the arms. Do not lift greater than 5 lbs. Adjust head rest in vehicle to prevent hyperextension if rear ended. Take extra precautions to avoid falling. Avoid frequent bending and stooping  No lifting greater than 10 lbs. May use ice or moist heat for pain. Weight loss is of benefit. Best medication for lumbar disc disease is arthritis medications like diclofenac, motrin, celebrex and naprosyn. Exercise is important to improve your indurance and does allow people to function better inspite of back pain.  Physical therapy at [facility] for cervical spondylosis and lumbar DDD.

## 2019-07-24 NOTE — Progress Notes (Signed)
Office Visit Note   Patient: Dwayne Acosta.           Date of Birth: 1956-01-23           MRN: [id number] Visit Date: 07/24/2019              Requested by: Sharilyn Sites, [address],  [place] [address] PCP: Sharilyn Sites, MD   Assessment & Plan: Visit Diagnoses:  1. S/P lumbar spinal fusion   2. Cervicalgia   3. Spondylosis without myelopathy or radiculopathy, lumbar region   4. Fusion of spine, lumbar region   5. Degenerative disc disease, lumbar     Plan: Avoid overhead lifting and overhead use of the arms. Do not lift greater than 5 lbs. Adjust head rest in vehicle to prevent hyperextension if rear ended. Take extra precautions to avoid falling. Avoid frequent bending and stooping  No lifting greater than 10 lbs. May use ice or moist heat for pain. Weight loss is of benefit. Best medication for lumbar disc disease is arthritis medications like diclofenac, motrin, celebrex and naprosyn. Exercise is important to improve your indurance and does allow people to function better inspite of back pain.  Physical therapy at [facility] for cervical spondylosis and lumbar DDD.   Follow-Up Instructions: Return in about 4 weeks (around 08/21/2019).   Orders:  Orders Placed This Encounter  Procedures  . XR Lumbar Spine 2-3 Views  . XR Cervical Spine 2 or 3 views   No orders of the defined types were placed in this encounter.     Procedures: No procedures performed   Clinical Data: No additional findings.   Subjective: Chief Complaint  Patient presents with  . Neck - Pain    Pain and stiffness, hard to turn his head, did PT a couple of years ago and it helped it, states that he has been trying to do some of the exercises they showed him and it doesn't seem to be helping.  . Lower Back - Follow-up    Doing ok, went on vacation in April and had issues with loading and unloading stuff and driving the 4 hours it took.  Took the Diclofenac and it has  helped him with this pain/discomfort he was having.    HPI  Review of Systems   Objective: Vital Signs: BP 118/81 (BP Location: Left Arm, Patient Position: Sitting)   Pulse (!) 55   Ht 5\' 9"  (1.753 m)   Wt 180 lb (81.6 kg)   BMI 26.58 kg/m   Physical Exam  Ortho Exam  Specialty Comments:  No specialty comments available.  Imaging: No results found.   PMFS History: Patient Active Problem List   Diagnosis Date Noted  . Spinal stenosis of lumbar region 12/05/2016    Priority: High    Class: Chronic  . Spinal stenosis of lumbar region with neurogenic claudication 12/05/2016  . Degenerative disc disease, cervical 11/21/2016  . Spondylosis without myelopathy or radiculopathy, cervical region 11/21/2016  . Acute appendicitis 07/20/2016  . 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 Increased frequency of urination 02/26/2015  . Urinary urgency 02/26/2015  . Conjunctivitis, chemical 04/16/2013  . Dry eye syndrome 12/12/2012  . 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 NS (nuclear sclerosis) 10/23/2012  . 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 Degenerative disc  disease, cervical 11/21/2016  . Leg pain   . 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 NS (nuclear sclerosis) 10/23/2012  . 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 Overactive bladder   . Spinal stenosis of lumbar region with neurogenic claudication 12/05/2016  . Spondylosis without myelopathy or radiculopathy, cervical region 11/21/2016    Family History  Problem Relation Age of Onset  . High blood pressure Mother   . Heart attack Father   . Parkinson's disease Father   . Stroke Paternal Grandmother   . Bone cancer Maternal Grandfather      Past Surgical History:  Procedure Laterality Date  . APPENDECTOMY    . BACK SURGERY  2005  . FOOT SURGERY Left    bunion  . LAPAROSCOPIC APPENDECTOMY N/A 07/20/2016   Procedure: APPENDECTOMY LAPAROSCOPIC;  Surgeon: Armandina Gemma, MD;  Location: [facility];  Service: General;  Laterality: N/A;  . LUMBAR FUSION  12/05/2016  . ROTATOR CUFF REPAIR Bilateral    Social History   Occupational History  . Occupation: Disability  Tobacco Use  . Smoking status: Never Smoker  . Smokeless tobacco: Never Used  Substance and Sexual Activity  . Alcohol use: Yes    Alcohol/week: 0.0 standard drinks    Comment: occ-gin or vodka 1-2 week  . Drug use: No  . Sexual activity: Not on file

## 2019-07-28 DIAGNOSIS — N320 Bladder-neck obstruction: Secondary | ICD-10-CM | POA: Diagnosis not present

## 2019-07-28 DIAGNOSIS — R3915 Urgency of urination: Secondary | ICD-10-CM | POA: Diagnosis not present

## 2019-07-28 DIAGNOSIS — E291 Testicular hypofunction: Secondary | ICD-10-CM | POA: Diagnosis not present

## 2019-07-28 DIAGNOSIS — R350 Frequency of micturition: Secondary | ICD-10-CM | POA: Diagnosis not present

## 2019-07-29 ENCOUNTER — Encounter: Payer: Self-pay | Admitting: Sports Medicine

## 2019-07-29 ENCOUNTER — Ambulatory Visit (INDEPENDENT_AMBULATORY_CARE_PROVIDER_SITE_OTHER): Payer: Medicare Other | Admitting: Sports Medicine

## 2019-07-29 ENCOUNTER — Other Ambulatory Visit: Payer: Self-pay

## 2019-07-29 VITALS — Temp 97.3°F

## 2019-07-29 DIAGNOSIS — Z9889 Other specified postprocedural states: Secondary | ICD-10-CM

## 2019-07-29 DIAGNOSIS — Q742 Other congenital malformations of lower limb(s), including pelvic girdle: Secondary | ICD-10-CM

## 2019-07-29 DIAGNOSIS — M2042 Other hammer toe(s) (acquired), left foot: Secondary | ICD-10-CM

## 2019-07-29 DIAGNOSIS — M79672 Pain in left foot: Secondary | ICD-10-CM

## 2019-07-29 NOTE — Progress Notes (Signed)
Subjective: Dwayne Acosta. is a 64 y.o. male patient seen today in office for POV  3 (DOS 07/07/2019), S/P left second through fourth hammertoe repair.  Patient reports that he is doing good with a little soreness at 2nd toe and itchy skin in between the 5th toe, denies calf pain, denies headache, chest pain, shortness of breath, nausea, vomiting, fever, or chills.  No other issues noted.   Patient Active Problem List   Diagnosis Date Noted  . Spinal stenosis of lumbar region 12/05/2016    Class: Chronic  . Spinal stenosis of lumbar region with neurogenic claudication 12/05/2016  . Degenerative disc disease, cervical 11/21/2016  . Spondylosis without myelopathy or radiculopathy, cervical region 11/21/2016  . Acute appendicitis 07/20/2016  . 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 Increased frequency of urination 02/26/2015  . Urinary urgency 02/26/2015  . Conjunctivitis, chemical 04/16/2013  . Dry eye syndrome 12/12/2012  . 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 NS (nuclear sclerosis) 10/23/2012  . 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 acetaminophen (TYLENOL) 500 MG tablet Take 1 tablet (500 mg total) by mouth every 6 (six) hours as needed for mild pain. Not relieved by Ultram 30 tablet 0  . diclofenac (VOLTAREN) 50 MG EC tablet Take 1 tablet (50 mg total) by mouth 3 (three) times daily as needed. 90 tablet 6  . docusate sodium (COLACE) 100 MG capsule Take 1 capsule (100 mg total) by mouth 2 (two) times daily. 10 capsule 0  . Dorzolamide HCl-Timolol Mal PF 2-0.5 % SOLN Apply 1 drop to eye 2 (two) times daily.    . fluticasone (FLONASE) 50 MCG/ACT nasal spray Place 2 sprays into both nostrils at bedtime.    . gabapentin (NEURONTIN) 100 MG capsule TAKE 1 CAPSULE BY MOUTH THREE  TIMES A DAY (Patient taking differently: Take 100 mg by mouth 2 (two) times daily. ) 30 capsule 6  . methocarbamol (ROBAXIN) 750 MG tablet TAKE 1 TABLET BY MOUTH EVERY 8 HOURS AS NEEDED FOR MUSCLE SPASMS 50 tablet 0  . minoxidil (ROGAINE) 2 % external solution Apply topically 1 day or 1 dose.    Marland Kitchen MOVANTIK 25 MG TABS tablet TAKE 1 TABLET BY MOUTH EVERY DAY (Patient taking differently: Take 25 mg by mouth as needed. ) 20 tablet 0  . Naproxen Sodium (ALEVE PO) Take by mouth as needed.    . promethazine (PHENERGAN) 25 MG tablet Take 1 tablet (25 mg total) by mouth every 8 (eight) hours as needed for nausea or vomiting. 20 tablet 0  . sildenafil (REVATIO) 20 MG tablet TAKE 2-5 TABLETS BY MOUTH ONCE A DAY AS NEEDED FOR SEXUAL ACTIVITY  3  . tizanidine (ZANAFLEX) 2 MG capsule TAKE 1 CAPSULE (2 MG TOTAL) BY MOUTH 3 (THREE) TIMES DAILY AS NEEDED FOR MUSCLE SPASMS. 270 capsule 6  . traMADol (ULTRAM) 50 MG tablet TAKE 1 TO 2 TABLETS EVERY 6 HOURS AS NEEDED FOR PAIN 28 tablet 0  . VESICARE 10 MG tablet Take 10 mg by mouth at bedtime.      Current Facility-Administered Medications on File Prior to Visit  Medication Dose Route Frequency Provider Last Rate Last Admin  . fentaNYL (SUBLIMAZE) injection    Anesthesia Intra-op Gaylene Brooks, CRNA   50 mcg at 11/21/16 [phone]  .  midazolam (VERSED) 5 MG/5ML injection    Anesthesia Intra-op Gaylene Brooks, CRNA   2 mg at 11/21/16 0655  . polyethylene glycol (MIRALAX / GLYCOLAX) packet 17 g  17 g Oral Daily Jessy Oto, MD        No Known Allergies  Objective: There were no vitals filed for this visit.  General: No acute distress, AAOx3  Left foot: K wires intact with no gapping or dehiscence at surgical site, mild swelling to second through fourth toes no erythema, no warmth, no active drainage, no signs of infection noted, mild blood noted that is dry in nature in between toes, mild maceration noted at the fourth webspace, capillary fill time <3 seconds in  all digits, gross sensation present via light touch to left foot. No pain with calf compression.   Assessment and Plan:  Problem List Items Addressed This Visit    None    Visit Diagnoses    Hammer toe of left foot    -  Primary   S/P foot surgery, left       Long toe       Left foot pain           -Patient seen and evaluated -Dressing change peformed  -Applied Betadine and dry sterile dressing to surgical site left foot secured with Coban wrap and stockinet  -Advised patient to make sure to keep dressings clean, dry, and intact to left surgical foot -Advised patient to continue with cam boot on left foot for protected weightbearing like previous  -Advised patient to limit activity to necessity like before -Advised patient to ice and elevate as instructed  -Continue with Ultram as needed for pain like previous  -Will plan for  xrays and possible kwire removal at next office visit. In the meantime, patient to call office if any issues or problems arise.   [name], DPM

## 2019-08-05 ENCOUNTER — Encounter: Payer: Self-pay | Admitting: Sports Medicine

## 2019-08-05 ENCOUNTER — Ambulatory Visit (INDEPENDENT_AMBULATORY_CARE_PROVIDER_SITE_OTHER): Payer: BC Managed Care – PPO

## 2019-08-05 ENCOUNTER — Other Ambulatory Visit: Payer: Self-pay

## 2019-08-05 ENCOUNTER — Ambulatory Visit (INDEPENDENT_AMBULATORY_CARE_PROVIDER_SITE_OTHER): Payer: Medicare Other | Admitting: Sports Medicine

## 2019-08-05 VITALS — Temp 97.3°F

## 2019-08-05 DIAGNOSIS — M79672 Pain in left foot: Secondary | ICD-10-CM

## 2019-08-05 DIAGNOSIS — M792 Neuralgia and neuritis, unspecified: Secondary | ICD-10-CM

## 2019-08-05 DIAGNOSIS — M2042 Other hammer toe(s) (acquired), left foot: Secondary | ICD-10-CM

## 2019-08-05 DIAGNOSIS — Q742 Other congenital malformations of lower limb(s), including pelvic girdle: Secondary | ICD-10-CM

## 2019-08-05 DIAGNOSIS — Z9889 Other specified postprocedural states: Secondary | ICD-10-CM

## 2019-08-05 MED ORDER — ACETAMINOPHEN 500 MG PO TABS: 500.0000 mg | ORAL_TABLET | Freq: Four times a day (QID) | ORAL | 0 refills | Status: DC | PRN

## 2019-08-05 MED ORDER — DOCUSATE SODIUM 100 MG PO CAPS: 100.0000 mg | ORAL_CAPSULE | Freq: Two times a day (BID) | ORAL | 0 refills | Status: DC

## 2019-08-05 NOTE — Progress Notes (Signed)
Subjective: Dwayne Acosta. is a 64 y.o. male patient seen today in office for POV for (DOS 07/07/2019), S/P left second through fourth hammertoe repair.  Patient reports that has a little pain off and on and had an episode of itchiness in between the fourth and 5th toe, relieved with Betadine, denies calf pain, denies headache, chest pain, shortness of breath, nausea, vomiting, fever, or chills.  No other issues noted.   Patient Active Problem List   Diagnosis Date Noted  . Spinal stenosis of lumbar region 12/05/2016    Class: Chronic  . Spinal stenosis of lumbar region with neurogenic claudication 12/05/2016  . Degenerative disc disease, cervical 11/21/2016  . Spondylosis without myelopathy or radiculopathy, cervical region 11/21/2016  . Acute appendicitis 07/20/2016  . 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 Increased frequency of urination 02/26/2015  . Urinary urgency 02/26/2015  . Conjunctivitis, chemical 04/16/2013  . Dry eye syndrome 12/12/2012  . 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 NS (nuclear sclerosis) 10/23/2012  . 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 acetaminophen (TYLENOL) 500 MG tablet Take 1 tablet (500 mg total) by mouth every 6 (six) hours as needed for mild pain. Not relieved by Ultram 30 tablet 0  . Cholecalciferol 25 MCG (1000 UT) tablet Take by mouth.    . diclofenac (VOLTAREN) 50 MG EC tablet Take 1 tablet (50 mg total) by mouth 3 (three) times daily as needed. 90 tablet 6  . docusate sodium (COLACE) 100 MG capsule Take 1 capsule (100 mg total) by mouth 2 (two) times daily. 10 capsule 0  . Dorzolamide HCl-Timolol Mal PF 2-0.5 % SOLN Apply 1 drop to eye 2 (two) times daily.    . fluticasone (FLONASE) 50 MCG/ACT nasal spray Place 2 sprays into both  nostrils at bedtime.    . gabapentin (NEURONTIN) 100 MG capsule TAKE 1 CAPSULE BY MOUTH THREE TIMES A DAY (Patient taking differently: Take 100 mg by mouth 2 (two) times daily. ) 30 capsule 6  . methocarbamol (ROBAXIN) 750 MG tablet TAKE 1 TABLET BY MOUTH EVERY 8 HOURS AS NEEDED FOR MUSCLE SPASMS 50 tablet 0  . minoxidil (ROGAINE) 2 % external solution Apply topically 1 day or 1 dose.    Marland Kitchen MOVANTIK 25 MG TABS tablet TAKE 1 TABLET BY MOUTH EVERY DAY (Patient taking differently: Take 25 mg by mouth as needed. ) 20 tablet 0  . Naproxen Sodium (ALEVE PO) Take by mouth as needed.    . promethazine (PHENERGAN) 25 MG tablet Take 1 tablet (25 mg total) by mouth every 8 (eight) hours as needed for nausea or vomiting. 20 tablet 0  . sildenafil (REVATIO) 20 MG tablet TAKE 2-5 TABLETS BY MOUTH ONCE A DAY AS NEEDED FOR SEXUAL ACTIVITY  3  . tizanidine (ZANAFLEX) 2 MG capsule TAKE 1 CAPSULE (2 MG TOTAL) BY MOUTH 3 (THREE) TIMES DAILY AS NEEDED FOR MUSCLE SPASMS. 270 capsule 6  . VESICARE 10 MG tablet Take 10 mg by mouth at bedtime.      Current Facility-Administered Medications on File Prior to Visit  Medication Dose Route Frequency Provider Last Rate Last Admin  . fentaNYL (SUBLIMAZE) injection    Anesthesia Intra-op Gaylene Brooks, CRNA   50 mcg at 11/21/16 [phone]  . midazolam (VERSED) 5 MG/5ML  injection    Anesthesia Intra-op Gaylene Brooks, CRNA   2 mg at 11/21/16 [phone]  . polyethylene glycol (MIRALAX / GLYCOLAX) packet 17 g  17 g Oral Daily Jessy Oto, MD        No Known Allergies  Objective: There were no vitals filed for this visit.  General: No acute distress, AAOx3  Left foot: K wires intact with no gapping or dehiscence at surgical site, mild swelling to second through fourth toes no erythema, no warmth, no active drainage, no signs of infection noted, mild blood noted that is dry in nature in between toes, resolved maceration noted at the fourth webspace, capillary fill time <3 seconds in  all digits, gross sensation present via light touch to left foot. No pain with calf compression.   X-rays consistent with postoperative status  Assessment and Plan:  Problem List Items Addressed This Visit    None    Visit Diagnoses    Hammer toe of left foot    -  Primary   Relevant Orders   DG Foot Complete Left (Completed)   S/P foot surgery, left       Long toe       Left foot pain       Neuritis           -Patient seen and evaluated -X-rays reviewed -K wires removed -Applied Betadine and Steri-Strips covered with stockinette to the toes of the left foot -Advised patient may shower on tomorrow and redress with a clean sock allowing the Steri-Strips to fall off on their own if there is swelling may use Ace wrap -Exchanged out cam boot for a postoperative shoe at this visit -Advised patient to limit activity to necessity like before -Advised patient to ice and elevate as instructed  -Continue with Ultram as needed for pain like previous and refill Tylenol and Colace to take as needed -Will plan for progressing out of postoperative shoe at next office visit. In the meantime, patient to call office if any issues or problems arise.   [name], DPM

## 2019-08-15 DIAGNOSIS — H0012 Chalazion right lower eyelid: Secondary | ICD-10-CM | POA: Diagnosis not present

## 2019-08-19 ENCOUNTER — Encounter: Payer: Self-pay | Admitting: Sports Medicine

## 2019-08-19 ENCOUNTER — Other Ambulatory Visit: Payer: Self-pay

## 2019-08-19 ENCOUNTER — Ambulatory Visit (INDEPENDENT_AMBULATORY_CARE_PROVIDER_SITE_OTHER): Payer: Medicare Other | Admitting: Sports Medicine

## 2019-08-19 VITALS — Temp 96.3°F

## 2019-08-19 DIAGNOSIS — Q742 Other congenital malformations of lower limb(s), including pelvic girdle: Secondary | ICD-10-CM

## 2019-08-19 DIAGNOSIS — Z9889 Other specified postprocedural states: Secondary | ICD-10-CM

## 2019-08-19 DIAGNOSIS — M79672 Pain in left foot: Secondary | ICD-10-CM

## 2019-08-19 DIAGNOSIS — M2042 Other hammer toe(s) (acquired), left foot: Secondary | ICD-10-CM

## 2019-08-19 NOTE — Progress Notes (Signed)
Subjective: Dwayne Acosta. is a 64 y.o. male patient seen today in office for post op care (DOS 07/07/2019), S/P left second through fourth hammertoe repair.  Patient reports that toes are still swollen and toes at tips were pins were removed are a little tender, denies calf pain, denies headache, chest pain, shortness of breath, nausea, vomiting, fever, or chills.  No other issues noted.   Patient Active Problem List   Diagnosis Date Noted  . Spinal stenosis of lumbar region 12/05/2016    Class: Chronic  . Spinal stenosis of lumbar region with neurogenic claudication 12/05/2016  . Degenerative disc disease, cervical 11/21/2016  . Spondylosis without myelopathy or radiculopathy, cervical region 11/21/2016  . Acute appendicitis 07/20/2016  . 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 Increased frequency of urination 02/26/2015  . Urinary urgency 02/26/2015  . Conjunctivitis, chemical 04/16/2013  . Dry eye syndrome 12/12/2012  . 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 NS (nuclear sclerosis) 10/23/2012  . 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 acetaminophen (TYLENOL) 500 MG tablet Take 1 tablet (500 mg total) by mouth every 6 (six) hours as needed for mild pain. Not relieved by Ultram 30 tablet 0  . Cholecalciferol 25 MCG (1000 UT) tablet Take by mouth.    . diclofenac (VOLTAREN) 50 MG EC tablet Take 1 tablet (50 mg total) by mouth 3 (three) times daily as needed. 90 tablet 6  . docusate sodium (COLACE) 100 MG capsule Take 1 capsule (100 mg total) by mouth 2 (two) times daily. 10 capsule 0  . Dorzolamide HCl-Timolol Mal PF 2-0.5 % SOLN Apply 1 drop to eye 2 (two) times daily.    . fluticasone (FLONASE) 50 MCG/ACT nasal spray Place 2 sprays into both nostrils at bedtime.    .  gabapentin (NEURONTIN) 100 MG capsule TAKE 1 CAPSULE BY MOUTH THREE TIMES A DAY (Patient taking differently: Take 100 mg by mouth 2 (two) times daily. ) 30 capsule 6  . methocarbamol (ROBAXIN) 750 MG tablet TAKE 1 TABLET BY MOUTH EVERY 8 HOURS AS NEEDED FOR MUSCLE SPASMS 50 tablet 0  . minoxidil (ROGAINE) 2 % external solution Apply topically 1 day or 1 dose.    Marland Kitchen MOVANTIK 25 MG TABS tablet TAKE 1 TABLET BY MOUTH EVERY DAY (Patient taking differently: Take 25 mg by mouth as needed. ) 20 tablet 0  . Naproxen Sodium (ALEVE PO) Take by mouth as needed.    . promethazine (PHENERGAN) 25 MG tablet Take 1 tablet (25 mg total) by mouth every 8 (eight) hours as needed for nausea or vomiting. 20 tablet 0  . sildenafil (REVATIO) 20 MG tablet TAKE 2-5 TABLETS BY MOUTH ONCE A DAY AS NEEDED FOR SEXUAL ACTIVITY  3  . tizanidine (ZANAFLEX) 2 MG capsule TAKE 1 CAPSULE (2 MG TOTAL) BY MOUTH 3 (THREE) TIMES DAILY AS NEEDED FOR MUSCLE SPASMS. 270 capsule 6  . VESICARE 10 MG tablet Take 10 mg by mouth at bedtime.      Current Facility-Administered Medications on File Prior to Visit  Medication Dose Route Frequency Provider Last Rate Last Admin  . fentaNYL (SUBLIMAZE) injection    Anesthesia Intra-op Gaylene Brooks, CRNA   50 mcg at 11/21/16 [phone]  . midazolam (VERSED) 5 MG/5ML injection    Anesthesia Intra-op  Gaylene Brooks, CRNA   2 mg at 11/21/16 [phone]  . polyethylene glycol (MIRALAX / GLYCOLAX) packet 17 g  17 g Oral Daily Jessy Oto, MD        No Known Allergies  Objective: There were no vitals filed for this visit.  General: No acute distress, AAOx3  Left foot:Surgical sites well healed, mild swelling to second through fourth toes no erythema, no warmth, no active drainage, no signs of infection noted, + dry peeling skin, resolved maceration noted at the fourth webspace, capillary fill time <3 seconds in all digits, gross sensation present via light touch to left foot. No pain with calf compression.    Assessment and Plan:  Problem List Items Addressed This Visit    None    Visit Diagnoses    S/P foot surgery, left    -  Primary   Hammer toe of left foot       Long toe       Left foot pain           -Patient seen and evaluated -Advised patient to start with scar creams to toes and that swelling may take several months to improve -Advised patient to ice and elevate as instructed  -Advised patient he may slowly transition to normal shoe as swelling allows  -Will plan for xrays at next office visit. In the meantime, patient to call office if any issues or problems arise.   [name], DPM

## 2019-08-25 ENCOUNTER — Telehealth: Payer: Self-pay | Admitting: Sports Medicine

## 2019-08-25 DIAGNOSIS — Z9889 Other specified postprocedural states: Secondary | ICD-10-CM

## 2019-08-25 DIAGNOSIS — M2042 Other hammer toe(s) (acquired), left foot: Secondary | ICD-10-CM

## 2019-08-25 NOTE — Telephone Encounter (Signed)
I spoke with pt and informed that I was not familiar with [facility], but Dr. Cannon Kettle had okayed the referral. Pt states Mr. Dario Ave is in with [facility]. Faxed referral, and demographics to [facility].

## 2019-08-25 NOTE — Telephone Encounter (Signed)
Yes we can refer her to PT at the location of his choice, for gait training, strengthening, ROM s/p hammertoe surgery May wear normal shoe as tolerated -Dr. Chauncey Cruel

## 2019-08-25 NOTE — Telephone Encounter (Signed)
Pt called and stated that he is having a hard time walking and in a previous appt the Dr suggested [facility] pt is interested in PT wanted to know if he can get a referral to a [facility] for physical therapy

## 2019-08-25 NOTE — Addendum Note (Signed)
Addended by: Harriett Sine D on: 08/25/2019 02:09 PM   Modules accepted: Orders

## 2019-09-03 ENCOUNTER — Ambulatory Visit: Payer: BLUE CROSS/BLUE SHIELD | Admitting: Specialist

## 2019-09-16 ENCOUNTER — Ambulatory Visit (INDEPENDENT_AMBULATORY_CARE_PROVIDER_SITE_OTHER): Payer: Medicare Other

## 2019-09-16 ENCOUNTER — Ambulatory Visit (INDEPENDENT_AMBULATORY_CARE_PROVIDER_SITE_OTHER): Payer: Medicare Other | Admitting: Sports Medicine

## 2019-09-16 ENCOUNTER — Encounter: Payer: Self-pay | Admitting: Sports Medicine

## 2019-09-16 ENCOUNTER — Other Ambulatory Visit: Payer: Self-pay

## 2019-09-16 DIAGNOSIS — Q742 Other congenital malformations of lower limb(s), including pelvic girdle: Secondary | ICD-10-CM

## 2019-09-16 DIAGNOSIS — M2042 Other hammer toe(s) (acquired), left foot: Secondary | ICD-10-CM | POA: Diagnosis not present

## 2019-09-16 DIAGNOSIS — Z9889 Other specified postprocedural states: Secondary | ICD-10-CM

## 2019-09-16 DIAGNOSIS — M79672 Pain in left foot: Secondary | ICD-10-CM

## 2019-09-16 NOTE — Progress Notes (Signed)
Subjective: Dwayne Acosta. is a 64 y.o. male patient seen today in office for post op care (DOS 07/07/2019), S/P left second through fourth hammertoe repair.  Patient reports that toes are still swollen and and feels like his toes are weak, denies headache, chest pain, shortness of breath, nausea, vomiting, fever, or chills.  No other issues noted.   Patient Active Problem List   Diagnosis Date Noted  . Spinal stenosis of lumbar region 12/05/2016    Class: Chronic  . Spinal stenosis of lumbar region with neurogenic claudication 12/05/2016  . Degenerative disc disease, cervical 11/21/2016  . Spondylosis without myelopathy or radiculopathy, cervical region 11/21/2016  . Acute appendicitis 07/20/2016  . 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 Increased frequency of urination 02/26/2015  . Urinary urgency 02/26/2015  . Conjunctivitis, chemical 04/16/2013  . Dry eye syndrome 12/12/2012  . 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 NS (nuclear sclerosis) 10/23/2012  . 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 acetaminophen (TYLENOL) 500 MG tablet Take 1 tablet (500 mg total) by mouth every 6 (six) hours as needed for mild pain. Not relieved by Ultram 30 tablet 0  . Cholecalciferol 25 MCG (1000 UT) tablet Take by mouth.    . diclofenac (VOLTAREN) 50 MG EC tablet Take 1 tablet (50 mg total) by mouth 3 (three) times daily as needed. 90 tablet 6  . docusate sodium (COLACE) 100 MG capsule Take 1 capsule (100 mg total) by mouth 2 (two) times daily. 10 capsule 0  . Dorzolamide HCl-Timolol Mal PF 2-0.5 % SOLN Apply 1 drop to eye 2 (two) times daily.    . fluticasone (FLONASE) 50 MCG/ACT nasal spray Place 2 sprays into both nostrils at bedtime.    . gabapentin (NEURONTIN) 100 MG capsule TAKE 1  CAPSULE BY MOUTH THREE TIMES A DAY (Patient taking differently: Take 100 mg by mouth 2 (two) times daily. ) 30 capsule 6  . methocarbamol (ROBAXIN) 750 MG tablet TAKE 1 TABLET BY MOUTH EVERY 8 HOURS AS NEEDED FOR MUSCLE SPASMS 50 tablet 0  . minoxidil (ROGAINE) 2 % external solution Apply topically 1 day or 1 dose.    Marland Kitchen MOVANTIK 25 MG TABS tablet TAKE 1 TABLET BY MOUTH EVERY DAY (Patient taking differently: Take 25 mg by mouth as needed. ) 20 tablet 0  . Naproxen Sodium (ALEVE PO) Take by mouth as needed.    . neomycin-polymyxin b-dexamethasone (MAXITROL) 3.5-10000-0.1 OINT Place into the right eye at bedtime.    . promethazine (PHENERGAN) 25 MG tablet Take 1 tablet (25 mg total) by mouth every 8 (eight) hours as needed for nausea or vomiting. 20 tablet 0  . sildenafil (REVATIO) 20 MG tablet TAKE 2-5 TABLETS BY MOUTH ONCE A DAY AS NEEDED FOR SEXUAL ACTIVITY  3  . tizanidine (ZANAFLEX) 2 MG capsule TAKE 1 CAPSULE (2 MG TOTAL) BY MOUTH 3 (THREE) TIMES DAILY AS NEEDED FOR MUSCLE SPASMS. 270 capsule 6  . VESICARE 10 MG tablet Take 10 mg by mouth at bedtime.      Current Facility-Administered Medications on File Prior to Visit  Medication Dose Route Frequency Provider Last Rate Last Admin  . fentaNYL (SUBLIMAZE) injection    Anesthesia Intra-op Gaylene Brooks, CRNA   50 mcg at 11/21/16 [phone]  . midazolam (  VERSED) 5 MG/5ML injection    Anesthesia Intra-op Gaylene Brooks, CRNA   2 mg at 11/21/16 0655  . polyethylene glycol (MIRALAX / GLYCOLAX) packet 17 g  17 g Oral Daily Jessy Oto, MD        No Known Allergies  Objective: There were no vitals filed for this visit.  General: No acute distress, AAOx3  Left foot:Surgical sites well healed, mild swelling to second through fourth toes no erythema, no warmth, no active drainage, no signs of infection noted, capillary fill time <3 seconds in all digits, gross sensation present via light touch to left foot.  Subjective weakness to toes.  No pain  with calf compression.   X-rays consistent with postoperative status  Assessment and Plan:  Problem List Items Addressed This Visit    None    Visit Diagnoses    Hammer toe of left foot    -  Primary   Relevant Orders   DG Foot Complete Left   S/P foot surgery, left       Long toe       Left foot pain           -Patient seen and evaluated -X-rays reviewed -Reassured patient that swelling and weakness is normal after surgery -Patient is awaiting to start physical therapy -Advised patient to continue with scar creams to toes and that swelling may take several months to improve like before as well as the sensitivity of the toes may take time to improve as well as any swelling around nail or nail folds -Advised patient to ice and elevate as instructed  -Continue with normal shoes as tolerated -Will plan for final xrays at next office visit. In the meantime, patient to call office if any issues or problems arise.   [name], DPM

## 2019-09-18 ENCOUNTER — Other Ambulatory Visit: Payer: Self-pay

## 2019-09-18 ENCOUNTER — Ambulatory Visit (HOSPITAL_COMMUNITY): Payer: BC Managed Care – PPO | Attending: Sports Medicine | Admitting: Physical Therapy

## 2019-09-18 ENCOUNTER — Encounter (HOSPITAL_COMMUNITY): Payer: Self-pay | Admitting: Physical Therapy

## 2019-09-18 DIAGNOSIS — R262 Difficulty in walking, not elsewhere classified: Secondary | ICD-10-CM | POA: Diagnosis not present

## 2019-09-18 DIAGNOSIS — M25572 Pain in left ankle and joints of left foot: Secondary | ICD-10-CM | POA: Diagnosis not present

## 2019-09-18 DIAGNOSIS — M25675 Stiffness of left foot, not elsewhere classified: Secondary | ICD-10-CM | POA: Diagnosis not present

## 2019-09-18 NOTE — Therapy (Signed)
Dwayne Acosta [address] [place], [place], [address] Phone: [phone]   Fax:  [phone]  Physical Therapy Evaluation  Patient Details  Name: Dwayne Acosta. MRN: [id number] Date of Birth: April 19, 1955 Referring Provider (PT): [name]    Encounter Date: 09/18/2019   PT End of Session - 09/18/19 1544    Visit Number 1    Number of Visits 4    Date for PT Re-Evaluation 10/18/19    Authorization Type Medicare/BCBS    Authorization - Visit Number 1    Authorization - Number of Visits 25    Progress Note Due on Visit 10    PT Start Time 1000    PT Stop Time 1040    PT Time Calculation (min) 40 min    Activity Tolerance Patient tolerated treatment well           Past Medical History:  Diagnosis Date  . Degenerative disc disease, cervical 11/21/2016  . Leg pain   . 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 NS (nuclear sclerosis) 10/23/2012  . 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 Overactive bladder   . Spinal stenosis of lumbar region with neurogenic claudication 12/05/2016  . Spondylosis without myelopathy or radiculopathy, cervical region 11/21/2016    Past Surgical History:  Procedure Laterality Date  . APPENDECTOMY    . BACK SURGERY  2005  . FOOT SURGERY Left    bunion  . LAPAROSCOPIC APPENDECTOMY N/A 07/20/2016   Procedure: APPENDECTOMY LAPAROSCOPIC;  Surgeon: Armandina Gemma, MD;  Location: [facility];  Service: General;  Laterality: N/A;  . LUMBAR FUSION  12/05/2016  . ROTATOR CUFF REPAIR Bilateral     There were no vitals filed for this visit.    Subjective Assessment - 09/18/19 1006    Subjective Dwayne Acosta states that he had his hammertoe on his Lt foot corrected eight weeks ago, three toes were shortened on 07/07/2019.  He states that he is doing ok but he does not have good control of his toes  and he is having some pain in it.    Pertinent History back fusion,    How long can you sit comfortably? no problem    How long can you stand comfortably? limited due to his back    How long can you walk comfortably? Does not walk a lot due to his back    Patient Stated Goals To have more control of his toes. Better balace    Currently in Pain? Yes    Pain Score 2    worst is a 5/10   Pain Location Toe (Comment which one)    Pain Orientation Left    Pain Descriptors / Indicators Tender    Pain Type Acute pain    Pain Onset More than a month ago    Pain Frequency Intermittent    Aggravating Factors  walking to more    Pain Relieving Factors ice    Effect of Pain on Daily Activities pt limitations mainly due to back              [facility] PT Assessment - 09/18/19 0001      Assessment   Medical Diagnosis Hammer toe revision of toes 2,3 and 4     Referring Provider (PT) [name]     Onset Date/Surgical Date 07/07/19    Prior Therapy  for his neck       Precautions   Precautions None      Restrictions   Weight Bearing Restrictions No      Balance Screen   Has the patient fallen in the past 6 months No    Has the patient had a decrease in activity level because of a fear of falling?  No      Home Ec[place] residence    Home Access Stairs to enter    Home Layout Two level      Prior Function   Level of [place] On disability    Leisure walking       Cognition   Overall Cognitive Status Within Functional Limits for tasks assessed      Functional Tests   Functional tests Single leg stance      Single Leg Stance   Comments RT: 60 seconeds     LT: 30 sec         ROM / Strength   AROM / PROM / Strength AROM      AROM   AROM Assessment Site Other (comment)   toe: abduction limited 25%; Flexion limited 75% ; ext 50%                      Objective measurements completed on examination: See  above findings.       [facility] Adult PT Treatment/Exercise - 09/18/19 0001      Exercises   Exercises Ankle;Other Exercises    Other Exercises  sitting toe abduction, flexion, extension and single leg stance x 10                   PT Education - 09/18/19 1544    Education Details HEP    Person(s) Educated Patient    Methods Explanation;Handout;Verbal cues    Comprehension Verbalized understanding;Returned demonstration            PT Short Term Goals - 09/18/19 1551      PT SHORT TERM GOAL #1   Title PT LT toe ROM to be improved by 25% to allow functional mobility.    Time 2    Period Weeks    Status New    Target Date 10/02/19      PT SHORT TERM GOAL #2   Title PT to be I in HEp to decrease pain to no greater than a 3/5 in pt Lt toes to allow walking for 30 minutes in comfort.             PT Long Term Goals - 09/18/19 1553      PT LONG TERM GOAL #1   Title PT I in advance HEP to decrease pain in his LT toes to no greater than a 1/10    Time 4    Period Weeks    Status New    Target Date 10/16/19      PT LONG TERM GOAL #2   Title PT to be able to single leg stance on his Right LE for 60 seconds to decrease risk of falling    Time 4    Period Weeks    Status New                  Plan - 09/18/19 1546    Clinical Impression Statement Dwayne Acosta is a 64 yo male who has had his middle 3 toes shortened on  his Lt foot.  He currently is being referred to skilled PT to improve his ambulation.  Evaluation demonstrates decreased ROM, decreased strength and decreased balance.  The therapist encouraged Dwayne Acosta to walk more as well as starting him on a HEP to address these issues.  He will benefit from continued skilled PT to maximize his functional ability.    Personal Factors and Comorbidities Comorbidity 1    Comorbidities back fusion limiting functional ability.    Examination-Activity Limitations Locomotion Level;Stairs     Examination-Participation Restrictions Community Activity    Stability/Clinical Decision Making Stable/Uncomplicated    Clinical Decision Making Low    Rehab Potential Good    PT Frequency 1x / week    PT Duration 4 weeks    PT Treatment/Interventions Gait training;Stair training;Functional mobility training;Patient/family education;Therapeutic exercise;Therapeutic activities;Manual techniques    PT Next Visit Plan begin marble pick up as well as towel crunch exercise and PROM for both toe extension and flexion.           Patient will benefit from skilled therapeutic intervention in order to improve the following deficits and impairments:  Decreased strength, Decreased range of motion, Difficulty walking, Decreased balance  Visit Diagnosis: Difficulty in walking, not elsewhere classified - Plan: PT plan of care cert/re-cert  Stiffness of left foot, not elsewhere classified - Plan: PT plan of care cert/re-cert  Pain in left ankle and joints of left foot - Plan: PT plan of care cert/re-cert     Problem List Patient Active Problem List   Diagnosis Date Noted  . Spinal stenosis of lumbar region 12/05/2016    Class: Chronic  . Spinal stenosis of lumbar region with neurogenic claudication 12/05/2016  . Degenerative disc disease, cervical 11/21/2016  . Spondylosis without myelopathy or radiculopathy, cervical region 11/21/2016  . Acute appendicitis 07/20/2016  . 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 Increased frequency of urination 02/26/2015  . Urinary urgency 02/26/2015  . Conjunctivitis, chemical 04/16/2013  . Dry eye syndrome 12/12/2012  . 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 NS (nuclear sclerosis) 10/23/2012  . 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 [place], [place], [address] Phone: [phone]   Fax:  [phone]  Name: Shen[place]ah Vandergriff. MRN: [id number] Date of Birth: 05-12-1955

## 2019-09-25 ENCOUNTER — Encounter (HOSPITAL_COMMUNITY): Payer: BC Managed Care – PPO | Admitting: Physical Therapy

## 2019-09-25 ENCOUNTER — Telehealth (HOSPITAL_COMMUNITY): Payer: Self-pay | Admitting: Physical Therapy

## 2019-09-25 NOTE — Telephone Encounter (Signed)
First no show:  Called pt left a message to call if he is unable to make next appointment.  Rayetta Humphrey, [place] CLT [phone]

## 2019-09-29 ENCOUNTER — Other Ambulatory Visit: Payer: Self-pay

## 2019-09-29 ENCOUNTER — Ambulatory Visit (HOSPITAL_COMMUNITY): Payer: BC Managed Care – PPO | Admitting: Physical Therapy

## 2019-09-29 DIAGNOSIS — M25572 Pain in left ankle and joints of left foot: Secondary | ICD-10-CM | POA: Diagnosis not present

## 2019-09-29 DIAGNOSIS — R262 Difficulty in walking, not elsewhere classified: Secondary | ICD-10-CM | POA: Diagnosis not present

## 2019-09-29 DIAGNOSIS — M25675 Stiffness of left foot, not elsewhere classified: Secondary | ICD-10-CM | POA: Diagnosis not present

## 2019-09-29 NOTE — Therapy (Signed)
[place], [place], [address] Phone: [phone]   Fax:  [phone]  Physical Therapy Treatment  Patient Details  Name: Dwayne Acosta. MRN: [id number] Date of Birth: 1956/01/23 Referring Provider (PT): [name]    Encounter Date: 09/29/2019   PT End of Session - 09/29/19 1454    Visit Number 2    Number of Visits 4    Date for PT Re-Evaluation 10/18/19    Authorization Type Medicare/BCBS    Authorization - Visit Number 2    Authorization - Number of Visits 25    Progress Note Due on Visit 10    PT Start Time 1450    PT Stop Time 1530    PT Time Calculation (min) 40 min    Activity Tolerance Patient tolerated treatment well           Past Medical History:  Diagnosis Date  . Degenerative disc disease, cervical 11/21/2016  . Leg pain   . 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 NS (nuclear sclerosis) 10/23/2012  . 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 Overactive bladder   . Spinal stenosis of lumbar region with neurogenic claudication 12/05/2016  . Spondylosis without myelopathy or radiculopathy, cervical region 11/21/2016    Past Surgical History:  Procedure Laterality Date  . APPENDECTOMY    . BACK SURGERY  2005  . FOOT SURGERY Left    bunion  . LAPAROSCOPIC APPENDECTOMY N/A 07/20/2016   Procedure: APPENDECTOMY LAPAROSCOPIC;  Surgeon: Armandina Gemma, MD;  Location: [facility];  Service: General;  Laterality: N/A;  . LUMBAR FUSION  12/05/2016  . ROTATOR CUFF REPAIR Bilateral     There were no vitals filed for this visit.   Subjective Assessment - 09/29/19 1505    Subjective pt states he missed his last appt because he got his appt mixed up.  STates his toes are alot better since beginning the exercises last session.  sTAtes his pain varies from 2/10-6/10.  currently 3/10     Currently in Pain? Yes    Pain Score 3     Pain Location Toe (Comment which one)    Pain Orientation Left    Pain Descriptors / Indicators Tender;Aching    Pain Type Acute pain                             [facility] Adult PT Treatment/Exercise - 09/29/19 0001      Exercises   Exercises Ankle;Other Exercises    Other Exercises  sitting toe abduction, flexion, extension and single leg stance x 10       Manual Therapy   Manual Therapy Edema management    Manual therapy comments Completed separate from the rest of treatment     Edema Management to toes Lt foot with elevation      Ankle Exercises: Seated   Towel Crunch Limitations   2 minutes   Marble Pickup 6 marbles 2X      Ankle Exercises: Standing   Vector Stance Right;Left;5 seconds   10 reps each with 1 HHA   Heel Raises 15 reps    Toe Raise 15 reps                  PT Education - 09/29/19 1548    Education Details  review of goals, HEP and POC moving forward    Person(s) Educated Patient    Methods Explanation    Comprehension Verbalized understanding            PT Short Term Goals - 09/29/19 1455      PT SHORT TERM GOAL #1   Title PT LT toe ROM to be improved by 25% to allow functional mobility.    Time 2    Period Weeks    Status On-going    Target Date 10/02/19      PT SHORT TERM GOAL #2   Title PT to be I in HEp to decrease pain to no greater than a 3/5 in pt Lt toes to allow walking for 30 minutes in comfort.    Status On-going             PT Long Term Goals - 09/29/19 1457      PT LONG TERM GOAL #1   Title PT I in advance HEP to decrease pain in his LT toes to no greater than a 1/10    Time 4    Period Weeks    Status On-going      PT LONG TERM GOAL #2   Title PT to be able to single leg stance on his Right LE for 60 seconds to decrease risk of falling    Time 4    Period Weeks    Status On-going                 Plan - 09/29/19 1549    Clinical Impression  Statement Reveiwed goals, HEP and POC moving forward.  PT with overall improved ROM of all toes.  Progressed to standing exercises with completion without pain.  Noted slight edema in toes so followed up with edema management at end of session.    Personal Factors and Comorbidities Comorbidity 1    Comorbidities back fusion limiting functional ability.    Examination-Activity Limitations Locomotion Level;Stairs    Examination-Participation Restrictions Community Activity    Stability/Clinical Decision Making Stable/Uncomplicated    Rehab Potential Good    PT Frequency 1x / week    PT Duration 4 weeks    PT Treatment/Interventions Gait training;Stair training;Functional mobility training;Patient/family education;Therapeutic exercise;Therapeutic activities;Manual techniques    PT Next Visit Plan Progress exercieses at tolerated.           Patient will benefit from skilled therapeutic intervention in order to improve the following deficits and impairments:  Decreased strength, Decreased range of motion, Difficulty walking, Decreased balance  Visit Diagnosis: Stiffness of left foot, not elsewhere classified  Pain in left ankle and joints of left foot  Difficulty in walking, not elsewhere classified     Problem List Patient Active Problem List   Diagnosis Date Noted  . Spinal stenosis of lumbar region 12/05/2016    Class: Chronic  . Spinal stenosis of lumbar region with neurogenic claudication 12/05/2016  . Degenerative disc disease, cervical 11/21/2016  . Spondylosis without myelopathy or radiculopathy, cervical region 11/21/2016  . Acute appendicitis 07/20/2016  . 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 Increased frequency of urination 02/26/2015  . Urinary urgency 02/26/2015  . Conjunctivitis, chemical 04/16/2013  . Dry eye syndrome 12/12/2012  . 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 NS (nuclear  sclerosis) 10/23/2012  . 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 MRN: [id number] Date of Birth: 03/12/1955

## 2019-10-07 ENCOUNTER — Other Ambulatory Visit: Payer: Self-pay

## 2019-10-07 ENCOUNTER — Ambulatory Visit (HOSPITAL_COMMUNITY): Payer: BC Managed Care – PPO | Attending: Sports Medicine | Admitting: Physical Therapy

## 2019-10-07 DIAGNOSIS — R262 Difficulty in walking, not elsewhere classified: Secondary | ICD-10-CM | POA: Insufficient documentation

## 2019-10-07 DIAGNOSIS — M25675 Stiffness of left foot, not elsewhere classified: Secondary | ICD-10-CM | POA: Diagnosis not present

## 2019-10-07 DIAGNOSIS — M25572 Pain in left ankle and joints of left foot: Secondary | ICD-10-CM | POA: Insufficient documentation

## 2019-10-07 NOTE — Therapy (Signed)
[place], [place], [address] Phone: [phone]   Fax:  [phone]  Physical Therapy Treatment  Patient Details  Name: Dwayne Acosta. MRN: [id number] Date of Birth: [birth year] Referring Provider (PT): [name]    Encounter Date: 10/07/2019   PT End of Session - 10/07/19 1029    Visit Number 3    Number of Visits 4    Date for PT Re-Evaluation 10/18/19    Authorization Type Medicare/BCBS    Authorization - Visit Number 3    Authorization - Number of Visits 25    Progress Note Due on Visit 10    PT Start Time [phone]    PT Stop Time 0920    PT Time Calculation (min) 47 min    Activity Tolerance Patient tolerated treatment well           Past Medical History:  Diagnosis Date  . Degenerative disc disease, cervical 11/21/2016  . Leg pain   . 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 NS (nuclear sclerosis) 10/23/2012  . 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 Overactive bladder   . Spinal stenosis of lumbar region with neurogenic claudication 12/05/2016  . Spondylosis without myelopathy or radiculopathy, cervical region 11/21/2016    Past Surgical History:  Procedure Laterality Date  . APPENDECTOMY    . BACK SURGERY  2005  . FOOT SURGERY Left    bunion  . LAPAROSCOPIC APPENDECTOMY N/A 07/20/2016   Procedure: APPENDECTOMY LAPAROSCOPIC;  Surgeon: Armandina Gemma, MD;  Location: [facility];  Service: General;  Laterality: N/A;  . LUMBAR FUSION  12/05/2016  . ROTATOR CUFF REPAIR Bilateral     There were no vitals filed for this visit.   Subjective Assessment - 10/07/19 1019    Currently in Pain? Yes    Pain Score 3     Pain Location Toe (Comment which one)    Pain Orientation Left    Pain Descriptors / Indicators Tender    Pain Type Acute pain                              [facility] Adult PT Treatment/Exercise - 10/07/19 0001      Exercises   Exercises Ankle;Other Exercises    Other Exercises  sitting toe abduction, flexion, extension and single leg stance x 10       Manual Therapy   Manual Therapy Edema management    Manual therapy comments Completed separate from the rest of treatment     Edema Management to toes Lt foot with elevation      Ankle Exercises: Standing   Vector Stance Right;Left;5 seconds    Vector Stance Limitations 10 reps with fingertip assist    Rocker Board Limitations    Rocker Board Limitations 10 reps A/P and Rt/Lt    Heel Raises 15 reps    Toe Raise 15 reps    Other Standing Ankle Exercises lunges onto 4" no UE 10 reps each                  PT Education - 10/07/19 0906    Education Details Pt given information sheet on compression toe caps to purchase and assist with reducing edema.    Person(s) Educated Patient    Methods Explanation;Handout  Comprehension Verbalized understanding            PT Short Term Goals - 09/29/19 1455      PT SHORT TERM GOAL #1   Title PT LT toe ROM to be improved by 25% to allow functional mobility.    Time 2    Period Weeks    Status On-going    Target Date 10/02/19      PT SHORT TERM GOAL #2   Title PT to be I in HEp to decrease pain to no greater than a 3/5 in pt Lt toes to allow walking for 30 minutes in comfort.    Status On-going             PT Long Term Goals - 09/29/19 1457      PT LONG TERM GOAL #1   Title PT I in advance HEP to decrease pain in his LT toes to no greater than a 1/10    Time 4    Period Weeks    Status On-going      PT LONG TERM GOAL #2   Title PT to be able to single leg stance on his Right LE for 60 seconds to decrease risk of falling    Time 4    Period Weeks    Status On-going                 Plan - 10/07/19 1030    Clinical Impression Statement Pt overall doing well with good motion  and improving strength.  Deficits remaining include edema and stablity.  Progressed standing exercises to address stability and improving functional strength.  pt shown some compression socks including toe caps that may help reduce his edema.  Pt is to look into this.  Instructed to wear these during the day, remove at night.    Personal Factors and Comorbidities Comorbidity 1    Comorbidities back fusion limiting functional ability.    Examination-Activity Limitations Locomotion Level;Stairs    Examination-Participation Restrictions Community Activity    Stability/Clinical Decision Making Stable/Uncomplicated    Rehab Potential Good    PT Frequency 1x / week    PT Duration 4 weeks    PT Treatment/Interventions Gait training;Stair training;Functional mobility training;Patient/family education;Therapeutic exercise;Therapeutic activities;Manual techniques    PT Next Visit Plan Progress exercieses at tolerated.  F/U if purchased toe caps for edema.    PT Home Exercise Plan 8/3:  vectors, SLS, heel and toe raises           Patient will benefit from skilled therapeutic intervention in order to improve the following deficits and impairments:  Decreased strength, Decreased range of motion, Difficulty walking, Decreased balance  Visit Diagnosis: Stiffness of left foot, not elsewhere classified  Pain in left ankle and joints of left foot  Difficulty in walking, not elsewhere classified     Problem List Patient Active Problem List   Diagnosis Date Noted  . Spinal stenosis of lumbar region 12/05/2016    Class: Chronic  . Spinal stenosis of lumbar region with neurogenic claudication 12/05/2016  . Degenerative disc disease, cervical 11/21/2016  . Spondylosis without myelopathy or radiculopathy, cervical region 11/21/2016  . Acute appendicitis 07/20/2016  . 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 Increased frequency of urination 02/26/2015  . Urinary urgency  02/26/2015  . Conjunctivitis, chemical 04/16/2013  . Dry eye syndrome 12/12/2012  . 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 NS (  nuclear sclerosis) 10/23/2012  . 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 [place], [place], [address] Phone: [phone]   Fax:  [phone]  Name: Loghan Kurtzman. MRN: [id number] Date of Birth: 12-10-1955

## 2019-10-13 DIAGNOSIS — H401132 Primary open-angle glaucoma, bilateral, moderate stage: Secondary | ICD-10-CM | POA: Diagnosis not present

## 2019-10-15 ENCOUNTER — Ambulatory Visit (HOSPITAL_COMMUNITY): Payer: BC Managed Care – PPO | Admitting: Physical Therapy

## 2019-10-15 ENCOUNTER — Encounter (HOSPITAL_COMMUNITY): Payer: Self-pay | Admitting: Physical Therapy

## 2019-10-15 ENCOUNTER — Other Ambulatory Visit: Payer: Self-pay

## 2019-10-15 DIAGNOSIS — R262 Difficulty in walking, not elsewhere classified: Secondary | ICD-10-CM | POA: Diagnosis not present

## 2019-10-15 DIAGNOSIS — M25675 Stiffness of left foot, not elsewhere classified: Secondary | ICD-10-CM

## 2019-10-15 DIAGNOSIS — M25572 Pain in left ankle and joints of left foot: Secondary | ICD-10-CM | POA: Diagnosis not present

## 2019-10-15 NOTE — Therapy (Signed)
[place], [place], [address] Phone: [phone]   Fax:  [phone]  Physical Therapy Treatment  Patient Details  Name: Dwayne Acosta. MRN: [id number] Date of Birth: Sep 29, 1955 Referring Provider (PT): [name]    PHYSICAL THERAPY DISCHARGE SUMMARY  Visits from Start of Care: 4  Current functional level related to goals / functional outcomes: All goals met   Remaining deficits: none   Education / Equipment: HEP Plan: Patient agrees to discharge.  Patient goals were met. Patient is being discharged due to meeting the stated rehab goals.  ?????      Encounter Date: 10/15/2019   PT End of Session - 10/15/19 0841    Visit Number 4    Number of Visits 4    Date for PT Re-Evaluation 10/18/19    Authorization Type Medicare/BCBS    Authorization - Visit Number 4    Authorization - Number of Visits 25    Progress Note Due on Visit 10    PT Start Time 0830    PT Stop Time 0900    PT Time Calculation (min) 30 min    Activity Tolerance Patient tolerated treatment well           Past Medical History:  Diagnosis Date  . Degenerative disc disease, cervical 11/21/2016  . Leg pain   . 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 NS (nuclear sclerosis) 10/23/2012  . 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 Overactive bladder   . Spinal stenosis of lumbar region with neurogenic claudication 12/05/2016  . Spondylosis without myelopathy or radiculopathy, cervical region 11/21/2016    Past Surgical History:  Procedure Laterality Date  . APPENDECTOMY    . BACK SURGERY  2005  . FOOT SURGERY Left    bunion  . LAPAROSCOPIC APPENDECTOMY N/A 07/20/2016   Procedure: APPENDECTOMY LAPAROSCOPIC;  Surgeon: Armandina Gemma, MD;  Location: [facility];  Service: General;  Laterality: N/A;  . LUMBAR FUSION   12/05/2016  . ROTATOR CUFF REPAIR Bilateral     There were no vitals filed for this visit.   Subjective Assessment - 10/15/19 0831    Subjective PT states that he has control of his toes now.    Pertinent History back fusion,    How long can you sit comfortably? no problem    How long can you stand comfortably? limited due to his back    How long can you walk comfortably? Does not walk a lot due to his back    Patient Stated Goals To have more control of his toes. Better balace    Currently in Pain? No/denies              [facility] PT Assessment - 10/15/19 0001      Assessment   Medical Diagnosis Hammer toe revision of toes 2,3 and 4     Referring Provider (PT) Lady Saucier Stover     Onset Date/Surgical Date 07/07/19    Prior Therapy for his neck       Precautions   Precautions None      Restrictions   Weight Bearing Restrictions No      Balance Screen   Has the patient fallen in the past 6 months No    Has the patient had a decrease in activity level because of a fear of falling?  No  Home Environment   Living Environment Private residence    Home Access Stairs to enter    Home Layout Two level      Prior Function   Level of [place] On disability    Leisure walking       Cognition   Overall Cognitive Status Within Functional Limits for tasks assessed      Functional Tests   Functional tests Single leg stance      Single Leg Stance   Comments RT: 60 seconeds  LT: 60 sec was 30 sec         AROM   AROM Assessment Site --   functional ROM was decreased abduction by 25%; flexion by 75                                PT Education - 10/15/19 0854    Education Details PT stated that he has been shying away from walking, explained to pt that he should continue to increase his walking and not shy away, pt given marbles for home use, went over safe ways to complete vector stances for home use.  PT given 2 week [employer]  sheet and explained that both for his toes and back walking would be a wonderful exercise and if he can not tolerate on land complete in pool.    Person(s) Educated Patient    Methods Explanation    Comprehension Verbalized understanding            PT Short Term Goals - 10/15/19 0844      PT SHORT TERM GOAL #1   Title PT LT toe ROM to be improved by 25% to allow functional mobility.    Time 2    Period Weeks    Status Achieved    Target Date 10/02/19      PT SHORT TERM GOAL #2   Title PT to be I in HEp to decrease pain to no greater than a 3/5 in pt Lt toes to allow walking for 30 minutes in comfort.    Status Achieved             PT Long Term Goals - 10/15/19 0844      PT LONG TERM GOAL #1   Title PT I in advance HEP to decrease pain in his LT toes to no greater than a 1/10    Time 4    Period Weeks    Status Achieved      PT LONG TERM GOAL #2   Title PT to be able to single leg stance on his Right LE for 60 seconds to decrease risk of falling    Time 4    Period Weeks    Status Achieved                 Plan - 10/15/19 0842    Clinical Impression Statement PT pleased with progress; he still notes occasional loses his balance but feels comfortable working this at home.    Personal Factors and Comorbidities Comorbidity 1    Comorbidities back fusion limiting functional ability.    Examination-Activity Limitations Locomotion Level;Stairs    Examination-Participation Restrictions Community Activity    Stability/Clinical Decision Making Stable/Uncomplicated    Rehab Potential Good    PT Frequency 1x / week    PT Duration 4 weeks    PT Treatment/Interventions Gait training;Stair training;Functional mobility training;Patient/family education;Therapeutic exercise;Therapeutic  activities;Manual techniques    PT Next Visit Plan Discharge.    PT Home Exercise Plan 8/3:  vectors, SLS, heel and toe raises           Patient will benefit from skilled therapeutic  intervention in order to improve the following deficits and impairments:  Decreased strength, Decreased range of motion, Difficulty walking, Decreased balance  Visit Diagnosis: Stiffness of left foot, not elsewhere classified  Pain in left ankle and joints of left foot  Difficulty in walking, not elsewhere classified     Problem List Patient Active Problem List   Diagnosis Date Noted  . Spinal stenosis of lumbar region 12/05/2016    Class: Chronic  . Spinal stenosis of lumbar region with neurogenic claudication 12/05/2016  . Degenerative disc disease, cervical 11/21/2016  . Spondylosis without myelopathy or radiculopathy, cervical region 11/21/2016  . Acute appendicitis 07/20/2016  . 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 Increased frequency of urination 02/26/2015  . Urinary urgency 02/26/2015  . Conjunctivitis, chemical 04/16/2013  . Dry eye syndrome 12/12/2012  . 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 NS (nuclear sclerosis) 10/23/2012  . 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 MRN: [id number] Date of Birth: March 31, 1955

## 2019-10-16 ENCOUNTER — Ambulatory Visit (INDEPENDENT_AMBULATORY_CARE_PROVIDER_SITE_OTHER): Payer: Medicare Other | Admitting: Sports Medicine

## 2019-10-16 ENCOUNTER — Encounter: Payer: Self-pay | Admitting: Sports Medicine

## 2019-10-16 ENCOUNTER — Ambulatory Visit (INDEPENDENT_AMBULATORY_CARE_PROVIDER_SITE_OTHER): Payer: Medicare Other

## 2019-10-16 DIAGNOSIS — Z9889 Other specified postprocedural states: Secondary | ICD-10-CM | POA: Diagnosis not present

## 2019-10-16 DIAGNOSIS — M79672 Pain in left foot: Secondary | ICD-10-CM

## 2019-10-16 DIAGNOSIS — M2042 Other hammer toe(s) (acquired), left foot: Secondary | ICD-10-CM | POA: Diagnosis not present

## 2019-10-16 DIAGNOSIS — M792 Neuralgia and neuritis, unspecified: Secondary | ICD-10-CM

## 2019-10-16 DIAGNOSIS — Q742 Other congenital malformations of lower limb(s), including pelvic girdle: Secondary | ICD-10-CM

## 2019-10-16 NOTE — Progress Notes (Signed)
Subjective: Dwayne Acosta. is a 64 y.o. male patient seen today in office for post op care (DOS 07/07/2019), S/P left second through fourth hammertoe repair.  Patient reports that he still has a little tingling in the toes but otherwise is doing better using Vicks VapoRub to nails with thickness.  Patient admits some areas of dry skin but otherwise no other pedal complaints.  Reports that he has completed physical therapy and has gained back a lot of mobility in his toes.  Patient Active Problem List   Diagnosis Date Noted  . Spinal stenosis of lumbar region 12/05/2016    Class: Chronic  . Spinal stenosis of lumbar region with neurogenic claudication 12/05/2016  . Degenerative disc disease, cervical 11/21/2016  . Spondylosis without myelopathy or radiculopathy, cervical region 11/21/2016  . Acute appendicitis 07/20/2016  . 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 Increased frequency of urination 02/26/2015  . Urinary urgency 02/26/2015  . Conjunctivitis, chemical 04/16/2013  . Dry eye syndrome 12/12/2012  . 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 NS (nuclear sclerosis) 10/23/2012  . 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 acetaminophen (TYLENOL) 500 MG tablet Take 1 tablet (500 mg total) by mouth every 6 (six) hours as needed for mild pain. Not relieved by Ultram 30 tablet 0  . Cholecalciferol 25 MCG (1000 UT) tablet Take by mouth.    . diclofenac (VOLTAREN) 50 MG EC tablet Take 1 tablet (50 mg total) by mouth 3 (three) times daily as needed. 90 tablet 6  . docusate sodium (COLACE) 100 MG capsule Take 1 capsule (100 mg total) by mouth 2 (two) times daily. 10 capsule 0  . Dorzolamide HCl-Timolol Mal PF 2-0.5 % SOLN Apply 1 drop to eye 2 (two) times daily.    . fluticasone (FLONASE)  50 MCG/ACT nasal spray Place 2 sprays into both nostrils at bedtime.    . gabapentin (NEURONTIN) 100 MG capsule TAKE 1 CAPSULE BY MOUTH THREE TIMES A DAY (Patient taking differently: Take 100 mg by mouth 2 (two) times daily. ) 30 capsule 6  . methocarbamol (ROBAXIN) 750 MG tablet TAKE 1 TABLET BY MOUTH EVERY 8 HOURS AS NEEDED FOR MUSCLE SPASMS 50 tablet 0  . minoxidil (ROGAINE) 2 % external solution Apply topically 1 day or 1 dose.    Marland Kitchen MOVANTIK 25 MG TABS tablet TAKE 1 TABLET BY MOUTH EVERY DAY (Patient taking differently: Take 25 mg by mouth as needed. ) 20 tablet 0  . Naproxen Sodium (ALEVE PO) Take by mouth as needed.    . neomycin-polymyxin b-dexamethasone (MAXITROL) 3.5-10000-0.1 OINT Place into the right eye at bedtime.    . promethazine (PHENERGAN) 25 MG tablet Take 1 tablet (25 mg total) by mouth every 8 (eight) hours as needed for nausea or vomiting. 20 tablet 0  . sildenafil (REVATIO) 20 MG tablet TAKE 2-5 TABLETS BY MOUTH ONCE A DAY AS NEEDED FOR SEXUAL ACTIVITY  3  . tizanidine (ZANAFLEX) 2 MG capsule TAKE 1 CAPSULE (2 MG TOTAL) BY MOUTH 3 (THREE) TIMES DAILY AS NEEDED FOR MUSCLE SPASMS. 270 capsule 6  . VESICARE 10 MG tablet Take 10 mg by mouth at bedtime.      Current Facility-Administered Medications on File Prior to Visit  Medication Dose Route Frequency Provider Last Rate Last Admin  .  fentaNYL (SUBLIMAZE) injection    Anesthesia Intra-op Gaylene Brooks, CRNA   50 mcg at 11/21/16 [phone]  . midazolam (VERSED) 5 MG/5ML injection    Anesthesia Intra-op Gaylene Brooks, CRNA   2 mg at 11/21/16 0655  . polyethylene glycol (MIRALAX / GLYCOLAX) packet 17 g  17 g Oral Daily Jessy Oto, MD        No Known Allergies  Objective: There were no vitals filed for this visit.  General: No acute distress, AAOx3  Left foot:Surgical sites well healed, minimal swelling to second through fourth toes no erythema, no warmth, no active drainage, no signs of infection noted, capillary fill  time <3 seconds in all digits, gross sensation present via light touch to left foot.  Range of motion within normal limits for postsurgical status.  No pain with calf compression.   X-rays consistent with postoperative status  Assessment and Plan:  Problem List Items Addressed This Visit    None    Visit Diagnoses    Hammer toe of left foot    -  Primary   Relevant Orders   DG Foot Complete Left (Completed)   S/P foot surgery, left       Long toe       Neuritis       Left foot pain           -Patient seen and evaluated -X-rays reviewed -Advised to continue with Vicks VapoRub to nails and advised patient that he may take a full year before his nerves returned to normal -Continue with exercises as taught by PT -Continue with normal shoes as tolerated -Gave sample of foot miracle cream to use for dry skin -Return to office as needed or sooner problems or issues arise.  Discharged from postoperative care [name], DPM

## 2019-11-28 ENCOUNTER — Telehealth: Payer: Self-pay | Admitting: Specialist

## 2019-12-01 NOTE — Telephone Encounter (Signed)
E 

## 2019-12-03 ENCOUNTER — Ambulatory Visit: Payer: BC Managed Care – PPO | Admitting: Specialist

## 2019-12-04 ENCOUNTER — Ambulatory Visit: Payer: BC Managed Care – PPO | Admitting: Surgery

## 2019-12-10 ENCOUNTER — Ambulatory Visit: Payer: BC Managed Care – PPO | Admitting: Surgery

## 2019-12-20 ENCOUNTER — Ambulatory Visit: Payer: Medicare Other | Attending: Internal Medicine

## 2019-12-20 ENCOUNTER — Other Ambulatory Visit: Payer: Self-pay

## 2019-12-20 DIAGNOSIS — Z23 Encounter for immunization: Secondary | ICD-10-CM

## 2019-12-20 NOTE — Progress Notes (Signed)
   [id number]  Name:  Dwayne Acosta.    MRN: [id number] DOB: [birth year]  12/20/2019  Dwayne Acosta was observed post Covid-19 immunization for 15 minutes without incident. He was provided with Vaccine Information Sheet and instruction to access the V-Safe system.   Dwayne Acosta was instructed to call 911 with any severe reactions post vaccine: Marland Kitchen Difficulty breathing  . Swelling of face and throat  . A fast heartbeat  . A bad rash all over body  . Dizziness and weakness

## 2020-01-07 ENCOUNTER — Ambulatory Visit (INDEPENDENT_AMBULATORY_CARE_PROVIDER_SITE_OTHER): Payer: BC Managed Care – PPO | Admitting: Specialist

## 2020-01-07 ENCOUNTER — Ambulatory Visit: Payer: Medicare Other

## 2020-01-07 ENCOUNTER — Other Ambulatory Visit: Payer: Self-pay

## 2020-01-07 ENCOUNTER — Encounter: Payer: Self-pay | Admitting: Specialist

## 2020-01-07 VITALS — BP 120/79 | HR 43 | Ht 69.0 in | Wt 183.4 lb

## 2020-01-07 DIAGNOSIS — M533 Sacrococcygeal disorders, not elsewhere classified: Secondary | ICD-10-CM | POA: Diagnosis not present

## 2020-01-07 DIAGNOSIS — Z981 Arthrodesis status: Secondary | ICD-10-CM

## 2020-01-07 DIAGNOSIS — G8929 Other chronic pain: Secondary | ICD-10-CM

## 2020-01-07 DIAGNOSIS — Z791 Long term (current) use of non-steroidal anti-inflammatories (NSAID): Secondary | ICD-10-CM

## 2020-01-07 DIAGNOSIS — M5136 Other intervertebral disc degeneration, lumbar region: Secondary | ICD-10-CM

## 2020-01-07 NOTE — Progress Notes (Signed)
Office Visit Note   Patient: Dwayne Acosta.           Date of Birth: 1955-04-17           MRN: [id number] Visit Date: 01/07/2020              Requested by: Sharilyn Sites, [address],  [place] [address] PCP: Sharilyn Sites, [place]   Assessment & Plan: Visit Diagnoses:  1. S/P lumbar spinal fusion   2. Degenerative disc disease, lumbar   3. Chronic right SI joint pain   4. NSAID long-term use     Plan: Avoid frequent bending and stooping  No lifting greater than 10 lbs. May use ice or moist heat for pain. Weight loss is of benefit. Best medication for lumbar disc disease is arthritis medications like diclofenac. Gabapentin 100mg  po up to TID dfor neurogenic pain. Exercise is important to improve your indurance and does allow people to function better inspite of back pain. Try to live with pain as long as possible as the scoliosis can require a fusion to cover the whole length of the curve.    Follow-Up Instructions: No follow-ups on file.   Orders:  Orders Placed This Encounter  Procedures  . XR Lumbar Spine 2-3 Views   No orders of the defined types were placed in this encounter.     Procedures: No procedures performed   Clinical Data: Findings:  Previous radiographs from 07/2019 with right scoliosis thoracolumbar with lateral listhesi of L3-4 and degenerative dis narrowing with anterior spurs L3-4 and to lesser degree L2-3. End plate sclerosis and vacumm sign.    Subjective: Chief Complaint  Patient presents with  . Lower Back - Follow-up    64 year old male with history of L4 to S1 fusions for spondylolisthesis. He has a thoracolumbar scoliosis and has pain into the back and radiiation in to the right posterior and lateral calf. This was exacerbated recently with travel to [place] for a funeral and with a trip to [place], packing and unpacking and riding. He uses a small pillow when sitting he uses the pillow to Decrease tendencies for the  back to hurt. A pillow in the small of the back. No bowel or bladder incontinence. Does have an overactive bladder and takes miralax for constipation. Caffiene He stays away from. Uses milk at night and it calms his bladder. He can walk and sometimes walking makes the pain in the back better. Over walking can worsen the pain.    Review of Systems  Constitutional: Negative.   HENT: Negative.   Eyes: Negative.   Respiratory: Negative.   Cardiovascular: Negative.   Gastrointestinal: Negative.   Endocrine: Negative.   Genitourinary: Negative.   Musculoskeletal: Negative.   Skin: Negative.   Allergic/Immunologic: Negative.   Neurological: Negative.   Hematological: Negative.   Psychiatric/Behavioral: Negative.      Objective: Vital Signs: BP 120/79 (BP Location: Left Arm, Patient Position: Sitting)   Pulse (!) 43   Ht 5\' 9"  (1.753 m)   Wt 183 lb 6.4 oz (83.2 kg)   BMI 27.08 kg/m   Physical Exam Constitutional:      Appearance: He is well-developed.  HENT:     Head: Normocephalic and atraumatic.  Eyes:     Pupils: Pupils are equal, round, and reactive to light.  Pulmonary:     Effort: Pulmonary effort is normal.     Breath sounds: Normal breath sounds.  Abdominal:  General: Bowel sounds are normal.     Palpations: Abdomen is soft.  Musculoskeletal:        General: Normal range of motion.     Cervical back: Normal range of motion and neck supple.  Skin:    General: Skin is warm and dry.  Neurological:     Mental Status: He is alert and oriented to person, place, and time.  Psychiatric:        Behavior: Behavior normal.        Thought Content: Thought content normal.        Judgment: Judgment normal.     Back Exam   Tenderness  The patient is experiencing tenderness in the lumbar.  Range of Motion  Extension: normal  Flexion: normal  Lateral bend right: normal  Lateral bend left: normal  Rotation right: normal  Rotation left: normal   Muscle Strength    Right Quadriceps:  5/5  Left Quadriceps:  5/5  Right Hamstrings:  5/5  Left Hamstrings:  5/5   Reflexes  Patellar: 2/4 Achilles: 2/4  Other  Toe walk: abnormal Heel walk: abnormal Gait: normal  Erythema: no back redness Scars: absent  Comments:  Right posterior rib hump long  He is off balance with standing and heel and toe walking.       Specialty Comments:  No specialty comments available.  Imaging: No results found.   PMFS History: Patient Active Problem List   Diagnosis Date Noted  . Spinal stenosis of lumbar region 12/05/2016    Priority: High    Class: Chronic  . Spinal stenosis of lumbar region with neurogenic claudication 12/05/2016  . Degenerative disc disease, cervical 11/21/2016  . Spondylosis without myelopathy or radiculopathy, cervical region 11/21/2016  . Acute appendicitis 07/20/2016  . 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 Increased frequency of urination 02/26/2015  . Urinary urgency 02/26/2015  . Conjunctivitis, chemical 04/16/2013  . Dry eye syndrome 12/12/2012  . 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 NS (nuclear sclerosis) 10/23/2012  . 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 Degenerative disc disease, cervical 11/21/2016  . Leg pain   . 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 NS (nuclear sclerosis) 10/23/2012  . 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 Overactive bladder   . Spinal stenosis of lumbar region with neurogenic claudication 12/05/2016  . Spondylosis without myelopathy or radiculopathy, cervical region 11/21/2016    Family History  Problem Relation Age of Onset  . High blood pressure Mother   . Heart attack Father   . Parkinson's disease  Father   . Stroke Paternal Grandmother   . Bone cancer Maternal Grandfather     Past Surgical History:  Procedure Laterality Date  . APPENDECTOMY    . BACK SURGERY  2005  . FOOT SURGERY Left    bunion  . LAPAROSCOPIC APPENDECTOMY N/A 07/20/2016   Procedure: APPENDECTOMY LAPAROSCOPIC;  Surgeon: Armandina Gemma, [place];  Location: [facility];  Service: General;  Laterality: N/A;  . LUMBAR FUSION  12/05/2016  . ROTATOR CUFF REPAIR Bilateral    Social History   Occupational History  . Occupation: Disability  Tobacco Use  . Smoking status: Never Smoker  . Smokeless tobacco: Never Used  Vaping Use  . Vaping Use: Never used  Substance and Sexual Activity  . Alcohol use: Yes    Alcohol/week: 0.0 standard drinks    Comment: occ-gin or vodka 1-2 week  . Drug use: No  . Sexual activity: Not on file

## 2020-01-21 ENCOUNTER — Encounter: Payer: Self-pay | Admitting: Podiatry

## 2020-01-21 ENCOUNTER — Ambulatory Visit (INDEPENDENT_AMBULATORY_CARE_PROVIDER_SITE_OTHER): Payer: BC Managed Care – PPO | Admitting: Podiatry

## 2020-01-21 ENCOUNTER — Other Ambulatory Visit: Payer: Self-pay

## 2020-01-21 DIAGNOSIS — M2042 Other hammer toe(s) (acquired), left foot: Secondary | ICD-10-CM

## 2020-01-21 DIAGNOSIS — L600 Ingrowing nail: Secondary | ICD-10-CM

## 2020-01-21 NOTE — Progress Notes (Signed)
Subjective:   Patient ID: Dwayne Acosta., male   DOB: 64 y.o.   MRN: [id number]   HPI Patient presents concerned about some swelling at the base of the hallux right and applied ice which seemed to improve it and also had questions concerning motion of the third digit left after surgery   ROS      Objective:  Physical Exam  Neurovascular status intact negative Bevelyn Buckles' sign noted with patient's base of the right hallux showing slight redness but it is localized and I did not note any drainage.  Patient was also noted to have good motion of the digits left with good clinical alignment     Assessment:  Possibility for low-grade infection that appears to be localized right hallux with no indication of proximal spread along with good digital correction left     Plan:  H&P reviewed conditions and advised on Epson salt soaks right and continuing to do stretching exercises left.  Patient's discharge will be seen back as needed

## 2020-02-02 DIAGNOSIS — E291 Testicular hypofunction: Secondary | ICD-10-CM | POA: Diagnosis not present

## 2020-02-02 DIAGNOSIS — R350 Frequency of micturition: Secondary | ICD-10-CM | POA: Diagnosis not present

## 2020-02-02 DIAGNOSIS — N320 Bladder-neck obstruction: Secondary | ICD-10-CM | POA: Diagnosis not present

## 2020-02-02 DIAGNOSIS — R3915 Urgency of urination: Secondary | ICD-10-CM | POA: Diagnosis not present

## 2020-02-19 DIAGNOSIS — E663 Overweight: Secondary | ICD-10-CM | POA: Diagnosis not present

## 2020-02-19 DIAGNOSIS — E782 Mixed hyperlipidemia: Secondary | ICD-10-CM | POA: Diagnosis not present

## 2020-02-19 DIAGNOSIS — Z1331 Encounter for screening for depression: Secondary | ICD-10-CM | POA: Diagnosis not present

## 2020-02-19 DIAGNOSIS — Z1389 Encounter for screening for other disorder: Secondary | ICD-10-CM | POA: Diagnosis not present

## 2020-02-19 DIAGNOSIS — E7849 Other hyperlipidemia: Secondary | ICD-10-CM | POA: Diagnosis not present

## 2020-02-19 DIAGNOSIS — R079 Chest pain, unspecified: Secondary | ICD-10-CM | POA: Diagnosis not present

## 2020-02-19 DIAGNOSIS — Z6827 Body mass index (BMI) 27.0-27.9, adult: Secondary | ICD-10-CM | POA: Diagnosis not present

## 2020-03-02 DIAGNOSIS — K7689 Other specified diseases of liver: Secondary | ICD-10-CM | POA: Diagnosis not present

## 2020-03-02 DIAGNOSIS — D1809 Hemangioma of other sites: Secondary | ICD-10-CM | POA: Diagnosis not present

## 2020-03-02 DIAGNOSIS — R160 Hepatomegaly, not elsewhere classified: Secondary | ICD-10-CM | POA: Diagnosis not present

## 2020-03-03 ENCOUNTER — Other Ambulatory Visit: Payer: Self-pay | Admitting: Specialist

## 2020-03-15 DIAGNOSIS — H6120 Impacted cerumen, unspecified ear: Secondary | ICD-10-CM | POA: Diagnosis not present

## 2020-03-15 DIAGNOSIS — Z6827 Body mass index (BMI) 27.0-27.9, adult: Secondary | ICD-10-CM | POA: Diagnosis not present

## 2020-03-15 DIAGNOSIS — R001 Bradycardia, unspecified: Secondary | ICD-10-CM | POA: Diagnosis not present

## 2020-03-16 DIAGNOSIS — H524 Presbyopia: Secondary | ICD-10-CM | POA: Diagnosis not present

## 2020-03-16 DIAGNOSIS — H401132 Primary open-angle glaucoma, bilateral, moderate stage: Secondary | ICD-10-CM | POA: Diagnosis not present

## 2020-06-25 DIAGNOSIS — Z681 Body mass index (BMI) 19 or less, adult: Secondary | ICD-10-CM | POA: Diagnosis not present

## 2020-06-25 DIAGNOSIS — J209 Acute bronchitis, unspecified: Secondary | ICD-10-CM | POA: Diagnosis not present

## 2020-06-25 DIAGNOSIS — J069 Acute upper respiratory infection, unspecified: Secondary | ICD-10-CM | POA: Diagnosis not present

## 2020-07-07 ENCOUNTER — Other Ambulatory Visit: Payer: Self-pay

## 2020-07-07 ENCOUNTER — Ambulatory Visit (INDEPENDENT_AMBULATORY_CARE_PROVIDER_SITE_OTHER): Payer: Medicare Other | Admitting: Specialist

## 2020-07-07 ENCOUNTER — Encounter: Payer: Self-pay | Admitting: Specialist

## 2020-07-07 VITALS — BP 137/88 | HR 52 | Ht 69.0 in | Wt 184.0 lb

## 2020-07-07 DIAGNOSIS — Z791 Long term (current) use of non-steroidal anti-inflammatories (NSAID): Secondary | ICD-10-CM | POA: Diagnosis not present

## 2020-07-07 DIAGNOSIS — M4326 Fusion of spine, lumbar region: Secondary | ICD-10-CM

## 2020-07-07 DIAGNOSIS — Z981 Arthrodesis status: Secondary | ICD-10-CM | POA: Diagnosis not present

## 2020-07-07 DIAGNOSIS — M5136 Other intervertebral disc degeneration, lumbar region: Secondary | ICD-10-CM

## 2020-07-07 DIAGNOSIS — M4156 Other secondary scoliosis, lumbar region: Secondary | ICD-10-CM

## 2020-07-07 DIAGNOSIS — M47816 Spondylosis without myelopathy or radiculopathy, lumbar region: Secondary | ICD-10-CM

## 2020-07-07 NOTE — Progress Notes (Signed)
Office Visit Note   Patient: Dwayne Acosta.           Date of Birth: [birth year]           MRN: [id number] Visit Date: 07/07/2020              Requested by: Sharilyn Sites, [address],  [place] [address] PCP: Sharilyn Sites, [place]   Assessment & Plan: Visit Diagnoses:  1. NSAID long-term use   2. Degenerative disc disease, lumbar   3. Fusion of spine, lumbar region   4. S/P lumbar spinal fusion   5. Spondylosis without myelopathy or radiculopathy, lumbar region   6. Other secondary scoliosis, lumbar region     Plan: Avoid frequent bending and stooping  No lifting greater than 10 lbs. May use ice or moist heat for pain. Weight loss is of benefit. Best medication for lumbar disc disease is arthritis medications like motrin, celebrex and naprosyn. Exercise is important to improve your indurance and does allow people to function better inspite of back pain.    Follow-Up Instructions: No follow-ups on file.   Orders:  No orders of the defined types were placed in this encounter.  No orders of the defined types were placed in this encounter.     Procedures: No procedures performed   Clinical Data: No additional findings.   Subjective: Chief Complaint  Patient presents with  . Lower Back - Follow-up    States that he does good most of the time, sometimes with activities he we have right leg pain, that can take the diclofenac and it helps him with the flare ups. Found a gel lumbar wedge that he bought for his truck and it has helped and he bought 2 more for the car and recliner.     65 year old male with history of spondylolisthesis post fusion L5-S1 with adjacent level DDD and degenerative slip post extension of his fusion to L4-5 12/2016. He retired and is doing some yard work and has back pain with flexion to pick up sticks or to work on garden. No bowel or bladder difficulty. Weather change to warmer is helpful. Not experiencing pain today. Uses a  cup in the truck with urgency.    Review of Systems  Constitutional: Negative.   HENT: Negative.   Eyes: Negative.   Respiratory: Negative.   Cardiovascular: Negative.   Gastrointestinal: Negative.   Endocrine: Negative.   Genitourinary: Negative.   Musculoskeletal: Negative.   Skin: Negative.   Allergic/Immunologic: Negative.   Neurological: Negative.   Hematological: Negative.   Psychiatric/Behavioral: Negative.      Objective: Vital Signs: BP 137/88 (BP Location: Left Arm, Patient Position: Sitting)   Pulse (!) 52   Ht 5\' 9"  (1.753 m)   Wt 184 lb (83.5 kg)   BMI 27.17 kg/m   Physical Exam Constitutional:      Appearance: He is well-developed.  HENT:     Head: Normocephalic and atraumatic.  Eyes:     Pupils: Pupils are equal, round, and reactive to light.  Pulmonary:     Effort: Pulmonary effort is normal.     Breath sounds: Normal breath sounds.  Abdominal:     General: Bowel sounds are normal.     Palpations: Abdomen is soft.  Musculoskeletal:        General: Normal range of motion.     Cervical back: Normal range of motion and neck supple.  Skin:    General: Skin is warm  and dry.  Neurological:     Mental Status: He is alert and oriented to person, place, and time.  Psychiatric:        Behavior: Behavior normal.        Thought Content: Thought content normal.        Judgment: Judgment normal.     Ortho Exam  Specialty Comments:  No specialty comments available.  Imaging: No results found.   PMFS History: Patient Active Problem List   Diagnosis Date Noted  . Spinal stenosis of lumbar region 12/05/2016    Priority: High    Class: Chronic  . Spinal stenosis of lumbar region with neurogenic claudication 12/05/2016  . Degenerative disc disease, cervical 11/21/2016  . Spondylosis without myelopathy or radiculopathy, cervical region 11/21/2016  . Acute appendicitis 07/20/2016  . 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 Bladder neck obstruction  02/26/2015  . Increased frequency of urination 02/26/2015  . Urinary urgency 02/26/2015  . Conjunctivitis, chemical 04/16/2013  . Dry eye syndrome 12/12/2012  . 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 NS (nuclear sclerosis) 10/23/2012  . 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 Degenerative disc disease, cervical 11/21/2016  . Leg pain   . 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 NS (nuclear sclerosis) 10/23/2012  . 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 Overactive bladder   . Spinal stenosis of lumbar region with neurogenic claudication 12/05/2016  . Spondylosis without myelopathy or radiculopathy, cervical region 11/21/2016    Family History  Problem Relation Age of Onset  . High blood pressure Mother   . Heart attack Father   . Parkinson's disease Father   . Stroke Paternal Grandmother   . Bone cancer Maternal Grandfather     Past Surgical History:  Procedure Laterality Date  . APPENDECTOMY    . BACK SURGERY  2005  . FOOT SURGERY Left    bunion  . LAPAROSCOPIC APPENDECTOMY N/A 07/20/2016   Procedure: APPENDECTOMY LAPAROSCOPIC;  Surgeon: Armandina Gemma, [place];  Location: [facility];  Service: General;  Laterality: N/A;  . LUMBAR FUSION  12/05/2016  . ROTATOR CUFF REPAIR Bilateral    Social History   Occupational History  . Occupation: Disability  Tobacco Use  . Smoking status: Never Smoker  . Smokeless tobacco: Never Used  Vaping Use  . Vaping Use: Never used  Substance and Sexual Activity  . Alcohol use: Yes    Alcohol/week: 0.0 standard drinks    Comment: occ-gin or vodka 1-2 week  . Drug use: No  . Sexual activity: Not on file

## 2020-07-07 NOTE — Patient Instructions (Signed)
Avoid frequent bending and stooping  No lifting greater than 10 lbs. May use ice or moist heat for pain. Weight loss is of benefit. Best medication for lumbar disc disease is arthritis medications like diclofenac. Becareful with the use of this Medication, drink plenty of water and take only as needed.  Exercise is important to improve your indurance and does allow people to function better inspite of back pain.

## 2020-08-05 DIAGNOSIS — N320 Bladder-neck obstruction: Secondary | ICD-10-CM | POA: Diagnosis not present

## 2020-08-05 DIAGNOSIS — R350 Frequency of micturition: Secondary | ICD-10-CM | POA: Diagnosis not present

## 2020-08-05 DIAGNOSIS — E291 Testicular hypofunction: Secondary | ICD-10-CM | POA: Diagnosis not present

## 2020-09-02 ENCOUNTER — Encounter: Payer: Self-pay | Admitting: Surgery

## 2020-09-02 ENCOUNTER — Other Ambulatory Visit: Payer: Self-pay

## 2020-09-02 ENCOUNTER — Ambulatory Visit (INDEPENDENT_AMBULATORY_CARE_PROVIDER_SITE_OTHER): Payer: Medicare Other

## 2020-09-02 ENCOUNTER — Ambulatory Visit (INDEPENDENT_AMBULATORY_CARE_PROVIDER_SITE_OTHER): Payer: Medicare Other | Admitting: Surgery

## 2020-09-02 VITALS — BP 130/87 | HR 49 | Ht 69.0 in | Wt 184.0 lb

## 2020-09-02 DIAGNOSIS — G8929 Other chronic pain: Secondary | ICD-10-CM | POA: Diagnosis not present

## 2020-09-02 DIAGNOSIS — M545 Low back pain, unspecified: Secondary | ICD-10-CM

## 2020-09-02 DIAGNOSIS — Z981 Arthrodesis status: Secondary | ICD-10-CM

## 2020-09-02 NOTE — Progress Notes (Signed)
Office Visit Note   Patient: Dwayne Acosta.           Date of Birth: 26-Dec-1955           MRN: [id number] Visit Date: 09/02/2020              Requested by: Sharilyn Sites, [address],  [place] [address] PCP: Sharilyn Sites, [place]   Assessment & Plan: Visit Diagnoses:  1. S/P lumbar spinal fusion   2. Chronic right-sided low back pain without sciatica   3.  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  He does have some adjacent segment disease which I advised him that is likely contributing to his current symptoms.  He does state that things are tolerable at this point.  I recommended that he use over-the-counter Aleve 2 tablets twice a day with food to see if this helps.  He will follow-up with me in a few weeks for recheck but advised him that if he is doing well he can call before that appointment and cancel.  He will continue his exercise program.  All questions answered.  Follow-Up Instructions: Return in about 4 weeks (around 09/30/2020) for with [facility] recheck lumbar.   Orders:  Orders Placed This Encounter  Procedures   XR Lumbar Spine 2-3 Views   No orders of the defined types were placed in this encounter.     Procedures: No procedures performed   Clinical Data: No additional findings.   Subjective: Chief Complaint  Patient presents with   Lower Back - Pain    HPI 65 year old black male comes in today with complaints of central low back pain.  He is status post  Right L4-5 Transforaminal lumbar interbody fusion with Depuy screws, rods and cages, local and allograft bone graft, Vivigen December 05, 2016 by Dr. Louanne Skye.  Last seen by Dr. Louanne Skye about 6 weeks ago.  States that he has central low back pain when he is riding in his truck.  He did get a lumbar support cushion for his car which did help for a little while.  States that he is better when he is up and moving  around.  Denies lower extremity radiculopathy.  He does a home exercise program that he feels is helping.   Review of Systems No current complaint of cardiac pulmonary GI GU issues  Objective: Vital Signs: BP 130/87   Pulse (!) 49   Ht 5\' 9"  (1.753 m)   Wt 184 lb (83.5 kg)   BMI 27.17 kg/m   Physical Exam HENT:     Head: Normocephalic.  Eyes:     Extraocular Movements: Extraocular movements intact.  Pulmonary:     Effort: No respiratory distress.  Musculoskeletal:     Comments: Gait is normal.   mild lumbar paraspinal tenderness.  No static notch tenderness.  Negative logroll.  Negative straight leg raise.  No focal motor deficits.  Neurological:     General: No focal deficit present.     Mental Status: He is alert and oriented to person, place, and time.  Psychiatric:        Mood and Affect: Mood normal.    Ortho Exam  Specialty Comments:  No specialty comments available.  Imaging: No results found.   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 12/12/2012   NS (nuclear sclerosis) 10/23/2012   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 10/23/2012   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   Meibomian gland dysfunction (MGD) of upper and lower lids  of both eyes 12/12/2012   NS (nuclear sclerosis) 10/23/2012   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 10/23/2012   Overactive bladder    Spinal stenosis of lumbar region with neurogenic claudication 12/05/2016   Spondylosis without myelopathy or radiculopathy, cervical region 11/21/2016    Family History  Problem Relation Age of Onset   High blood pressure Mother    Heart attack Father    Parkinson's disease Father    Stroke Paternal Grandmother    Bone cancer Maternal Grandfather     Past Surgical History:  Procedure Laterality Date   APPENDECTOMY     BACK SURGERY  2005   FOOT SURGERY Left    bunion   LAPAROSCOPIC APPENDECTOMY N/A 07/20/2016   Procedure: APPENDECTOMY LAPAROSCOPIC;  Surgeon: Armandina Gemma, [place];  Location: [facility];  Service: General;  Laterality: N/A;   LUMBAR FUSION  12/05/2016   ROTATOR CUFF REPAIR Bilateral    Social History   Occupational History   Occupation: Disability  Tobacco Use   Smoking status: Never   Smokeless tobacco: Never  Vaping Use   Vaping Use: Never used  Substance and Sexual Activity   Alcohol use: Yes    Alcohol/week: 0.0 standard drinks    Comment: occ-gin or vodka 1-2 week   Drug use: No   Sexual activity: Not on file

## 2020-09-03 ENCOUNTER — Encounter: Payer: Self-pay | Admitting: Surgery

## 2020-09-13 DIAGNOSIS — H401132 Primary open-angle glaucoma, bilateral, moderate stage: Secondary | ICD-10-CM | POA: Diagnosis not present

## 2020-09-14 DIAGNOSIS — R160 Hepatomegaly, not elsewhere classified: Secondary | ICD-10-CM | POA: Diagnosis not present

## 2020-09-14 DIAGNOSIS — D1803 Hemangioma of intra-abdominal structures: Secondary | ICD-10-CM | POA: Diagnosis not present

## 2020-09-14 DIAGNOSIS — K828 Other specified diseases of gallbladder: Secondary | ICD-10-CM | POA: Diagnosis not present

## 2020-09-14 DIAGNOSIS — K7689 Other specified diseases of liver: Secondary | ICD-10-CM | POA: Diagnosis not present

## 2020-10-04 ENCOUNTER — Telehealth: Payer: Self-pay | Admitting: Specialist

## 2020-10-04 NOTE — Telephone Encounter (Signed)
Please advise 

## 2020-10-04 NOTE — Telephone Encounter (Signed)
Pt called stating he has a weight restriction of 10 pounds but he would like to have it moved up to 15 pounds because he brought something that's 12 pounds and he has no trouble lifting it. Pt asked to have this added to his Dr notes and would like a CB when this has been done.   [phone]

## 2020-10-06 ENCOUNTER — Telehealth: Payer: Self-pay | Admitting: Specialist

## 2020-10-06 NOTE — Telephone Encounter (Signed)
Pt called about an update for weight advance. Please call pt with an answer. Pt phone number is [phone].

## 2020-10-07 ENCOUNTER — Ambulatory Visit: Payer: BC Managed Care – PPO | Admitting: Surgery

## 2020-10-07 NOTE — Telephone Encounter (Signed)
I called and spoke with patient to get clarification on his message. He states that he had backpack leaf blower that weighted about 22lbs, and due to his restrictions he had to sale it.  So the lightest leaf blower that he can find is 11-12 lbs, and the same thing with the weedeater. He is asking that we increase  his restrictions to lifting no more than 15lbs.  He just wants this to be documented so that if his social security ever questioned it that it would be documented in his chart---Please advise.

## 2020-11-30 ENCOUNTER — Ambulatory Visit (INDEPENDENT_AMBULATORY_CARE_PROVIDER_SITE_OTHER): Payer: Medicare Other | Admitting: Podiatry

## 2020-11-30 ENCOUNTER — Other Ambulatory Visit: Payer: Self-pay

## 2020-11-30 DIAGNOSIS — L988 Other specified disorders of the skin and subcutaneous tissue: Secondary | ICD-10-CM | POA: Diagnosis not present

## 2020-11-30 DIAGNOSIS — L603 Nail dystrophy: Secondary | ICD-10-CM | POA: Diagnosis not present

## 2020-11-30 DIAGNOSIS — M792 Neuralgia and neuritis, unspecified: Secondary | ICD-10-CM | POA: Diagnosis not present

## 2020-11-30 DIAGNOSIS — M2042 Other hammer toe(s) (acquired), left foot: Secondary | ICD-10-CM

## 2020-11-30 NOTE — Patient Instructions (Signed)
You can use "urea nail gel" on the toenail

## 2020-12-03 NOTE — Progress Notes (Signed)
Subjective: 65 year old male presents the office today for concerns of some mild swelling still present the toes intermittently get some tingling to the toes on the left foot and digits 2 through 4.  Previously had surgery performed by Dr. Cannon Kettle in May 2021.  He states that he is able to do his daily activities without any restrictions.  He states the symptoms are intermittent.  Also has secondary concerns as he states that he has some stinging to his toenails and he applied Betadine and that resolved.  No swelling or redness or any drainage to the toenail sites.  No other concerns.  Objective: AAO x3, NAD DP/PT pulses palpable bilaterally, CRT less than 3 seconds Status post hammertoe repair of the left second, third, fourth toe.  There is slight swelling present but there is no erythema or warmth there is no open sores.  There is no tenderness palpation on exam today.  The nails are hypertrophic, dystrophic.  No pain to the nails and there is no redness or drainage or any hyperpigmentation.  Left fourth interspace is macerated without any drainage or pus. No pain with calf compression, swelling, warmth, erythema  Assessment: Onychodystrophy; toe swelling  Plan: -All treatment options discussed with the patient including all alternatives, risks, complications.  -Regards to the toes I think this is still some residual swelling from surgery although has been quite sometime since the surgery.  Encouraged him to continue shoes and good arch support.  Is not causing significant disability is able to do his daily activities. -Discussed treating the nails for nail fungus but he states that he was told he did not have fungus previously.  Discussed urea nail gel. -Recommend drying thoroughly between the toes.  He can apply a small amount of Betadine on the fourth interspace of the left foot to help with the maceration. -Patient encouraged to call the office with any questions, concerns, change in symptoms.    Trula Slade DPM

## 2021-01-24 DIAGNOSIS — J069 Acute upper respiratory infection, unspecified: Secondary | ICD-10-CM | POA: Diagnosis not present

## 2021-02-17 ENCOUNTER — Ambulatory Visit: Payer: BC Managed Care – PPO | Admitting: Surgery

## 2021-02-23 ENCOUNTER — Other Ambulatory Visit: Payer: Self-pay

## 2021-02-23 ENCOUNTER — Ambulatory Visit: Payer: Self-pay

## 2021-02-23 ENCOUNTER — Ambulatory Visit (INDEPENDENT_AMBULATORY_CARE_PROVIDER_SITE_OTHER): Payer: Medicare Other | Admitting: Surgery

## 2021-02-23 DIAGNOSIS — M5441 Lumbago with sciatica, right side: Secondary | ICD-10-CM

## 2021-02-23 DIAGNOSIS — M47816 Spondylosis without myelopathy or radiculopathy, lumbar region: Secondary | ICD-10-CM | POA: Diagnosis not present

## 2021-02-23 MED ORDER — METHOCARBAMOL 500 MG PO TABS: 500.0000 mg | ORAL_TABLET | Freq: Two times a day (BID) | ORAL | 0 refills | Status: DC | PRN

## 2021-02-23 MED ORDER — DICLOFENAC SODIUM 75 MG PO TBEC: 75.0000 mg | DELAYED_RELEASE_TABLET | Freq: Two times a day (BID) | ORAL | 0 refills | Status: AC

## 2021-03-11 NOTE — Progress Notes (Signed)
Office Visit Note   Patient: Dwayne Acosta.           Date of Birth: [birth year]           MRN: [id number] Visit Date: 02/23/2021              Requested by: Sharilyn Sites, [address] [place],  [place] [address] PCP: Sharilyn Sites, MD   Assessment & Plan: Visit Diagnoses:  1. Acute bilateral low back pain with right-sided sciatica   2. Spondylosis without myelopathy or radiculopathy, lumbar region     Plan: At this point we will attend conservative treatment.  Patient was sent in prescriptions for Voltaren 75 mg p.o. twice daily to be taken as needed with food and Robaxin as needed as directed for spasms.  Follow-up with Dr. Louanne Skye in 4 weeks for recheck.  If he continues have ongoing pain then Dr. Louanne Skye will decide if CT or lumbar MRI is needed.  Also reviewed x-rays with patient today.  Advised him that his hardware is intact.  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  Follow-Up Instructions: Return in about 4 weeks (around 03/23/2021) for with dr Louanne Skye recheck back pain.  previous fusion. .   Orders:  Orders Placed This Encounter  Procedures   XR Lumbar Spine 2-3 Views   Meds ordered this encounter  Medications   methocarbamol (ROBAXIN) 500 MG tablet    Sig: Take 1 tablet (500 mg total) by mouth every 12 (twelve) hours as needed for muscle spasms.    Dispense:  40 tablet    Refill:  0   diclofenac (VOLTAREN) 75 MG EC tablet    Sig: Take 1 tablet (75 mg total) by mouth 2 (two) times daily.    Dispense:  60 tablet    Refill:  0      Procedures: No procedures performed   Clinical Data: No additional findings.   Subjective: Chief Complaint  Patient presents with   Lower Back - Pain    HPI 66 year old black male who is status post right L4-5 fusion by Dr. Louanne Skye December 05, 2016 comes in with complaints of back pain.  States that he gets back pain when he is sitting in his pickup truck.  Patient was last seen by me September 02, 2020 again with complaints of central low back pain and also when he is riding in his truck.  States that he has off-and-on right leg pain and wants to check his hardware. Review of Systems No current cardiac pulmonary GI GU issues  Objective: Vital Signs: There were no vitals taken for this visit.  Physical Exam Constitutional:      General: He is not in acute distress. HENT:     Head: Normocephalic and atraumatic.     Nose: Nose normal.  Pulmonary:     Effort: No respiratory distress.  Musculoskeletal:     Comments: Gait is normal.  Lumbar spine nontender.  Negative logroll bilateral hips.  Negative straight leg raise.  No focal motor deficits.  Neurological:     General: No focal deficit present.     Mental Status: He is alert and oriented to person, place, and time.  Psychiatric:        Mood and Affect: Mood normal.    Ortho Exam  Specialty Comments:  No specialty comments available.  Imaging: No results found.   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 12/12/2012   NS (nuclear sclerosis) 10/23/2012   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 10/23/2012   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   Meibomian gland dysfunction (MGD) of upper and  lower lids of both eyes 12/12/2012   NS (nuclear sclerosis) 10/23/2012   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 10/23/2012   Overactive bladder    Spinal stenosis of lumbar region with neurogenic claudication 12/05/2016   Spondylosis without myelopathy or radiculopathy, cervical region 11/21/2016    Family History  Problem Relation Age of Onset   High blood pressure Mother    Heart attack Father    Parkinson's disease Father    Stroke Paternal Grandmother    Bone cancer Maternal Grandfather     Past Surgical History:  Procedure Laterality Date   APPENDECTOMY     BACK SURGERY  2005   FOOT SURGERY Left    bunion   LAPAROSCOPIC APPENDECTOMY N/A 07/20/2016   Procedure: APPENDECTOMY LAPAROSCOPIC;  Surgeon: Armandina Gemma, MD;  Location: [facility];  Service: General;  Laterality: N/A;   LUMBAR FUSION  12/05/2016   ROTATOR CUFF REPAIR Bilateral    Social History   Occupational History   Occupation: Disability  Tobacco Use   Smoking status: Never   Smokeless tobacco: Never  Vaping Use   Vaping Use: Never used  Substance and Sexual Activity   Alcohol use: Yes    Alcohol/week: 0.0 standard drinks    Comment: occ-gin or vodka 1-2 week   Drug use: No   Sexual activity: Not on file

## 2021-03-21 DIAGNOSIS — H5213 Myopia, bilateral: Secondary | ICD-10-CM | POA: Diagnosis not present

## 2021-03-21 DIAGNOSIS — H401132 Primary open-angle glaucoma, bilateral, moderate stage: Secondary | ICD-10-CM | POA: Diagnosis not present

## 2021-03-23 ENCOUNTER — Ambulatory Visit: Payer: BC Managed Care – PPO | Admitting: Specialist

## 2021-04-14 DIAGNOSIS — Z6827 Body mass index (BMI) 27.0-27.9, adult: Secondary | ICD-10-CM | POA: Diagnosis not present

## 2021-04-14 DIAGNOSIS — E7849 Other hyperlipidemia: Secondary | ICD-10-CM | POA: Diagnosis not present

## 2021-04-14 DIAGNOSIS — R7989 Other specified abnormal findings of blood chemistry: Secondary | ICD-10-CM | POA: Diagnosis not present

## 2021-04-14 DIAGNOSIS — Z0000 Encounter for general adult medical examination without abnormal findings: Secondary | ICD-10-CM | POA: Diagnosis not present

## 2021-04-14 DIAGNOSIS — K5732 Diverticulitis of large intestine without perforation or abscess without bleeding: Secondary | ICD-10-CM | POA: Diagnosis not present

## 2021-04-14 DIAGNOSIS — Z1331 Encounter for screening for depression: Secondary | ICD-10-CM | POA: Diagnosis not present

## 2021-04-14 DIAGNOSIS — Z23 Encounter for immunization: Secondary | ICD-10-CM | POA: Diagnosis not present

## 2021-04-14 DIAGNOSIS — J302 Other seasonal allergic rhinitis: Secondary | ICD-10-CM | POA: Diagnosis not present

## 2021-05-26 ENCOUNTER — Other Ambulatory Visit: Payer: Self-pay | Admitting: Surgery

## 2021-05-26 DIAGNOSIS — N320 Bladder-neck obstruction: Secondary | ICD-10-CM | POA: Diagnosis not present

## 2021-05-26 DIAGNOSIS — E291 Testicular hypofunction: Secondary | ICD-10-CM | POA: Diagnosis not present

## 2021-05-27 ENCOUNTER — Other Ambulatory Visit: Payer: Self-pay | Admitting: Orthopaedic Surgery

## 2021-07-07 ENCOUNTER — Encounter: Payer: Self-pay | Admitting: Specialist

## 2021-07-07 ENCOUNTER — Ambulatory Visit (INDEPENDENT_AMBULATORY_CARE_PROVIDER_SITE_OTHER): Payer: Medicare Other | Admitting: Specialist

## 2021-07-07 ENCOUNTER — Ambulatory Visit (INDEPENDENT_AMBULATORY_CARE_PROVIDER_SITE_OTHER): Payer: Medicare Other

## 2021-07-07 VITALS — BP 116/78 | HR 71 | Ht 69.0 in | Wt 184.0 lb

## 2021-07-07 DIAGNOSIS — M25552 Pain in left hip: Secondary | ICD-10-CM

## 2021-07-07 DIAGNOSIS — G8929 Other chronic pain: Secondary | ICD-10-CM

## 2021-07-07 DIAGNOSIS — M5136 Other intervertebral disc degeneration, lumbar region: Secondary | ICD-10-CM

## 2021-07-07 DIAGNOSIS — M4156 Other secondary scoliosis, lumbar region: Secondary | ICD-10-CM

## 2021-07-07 DIAGNOSIS — Z981 Arthrodesis status: Secondary | ICD-10-CM

## 2021-07-07 DIAGNOSIS — M533 Sacrococcygeal disorders, not elsewhere classified: Secondary | ICD-10-CM

## 2021-07-07 DIAGNOSIS — Z791 Long term (current) use of non-steroidal anti-inflammatories (NSAID): Secondary | ICD-10-CM

## 2021-07-07 NOTE — Patient Instructions (Signed)
Avoid frequent bending and stooping  No lifting greater than 10 lbs. May use ice or moist heat for pain. Weight loss is of benefit. Best medication for lumbar disc disease is arthritis medications like diclofenac, motrin, celebrex and naprosyn. Exercise is important to improve your indurance and does allow people to function better inspite of back pain.   

## 2021-07-07 NOTE — Progress Notes (Signed)
? ?Office Visit Note ?  ?Patient: Dwayne Acosta.           ?Date of Birth: [birth year]           ?MRN: [id number] ?Visit Date: 07/07/2021 ?             ?Requested by: Sharilyn Sites, MD ?[address] ?[place],  [place] [address] ?PCP: Sharilyn Sites, MD ? ? ?Assessment & Plan: ?Visit Diagnoses:  ?1. Pain in left hip   ?2. Degenerative disc disease, lumbar   ?3. NSAID long-term use   ?4. Other secondary scoliosis, lumbar region   ?5. S/P lumbar spinal fusion   ?6. Chronic right SI joint pain   ? ? ?Plan: Avoid frequent bending and stooping  ?No lifting greater than 10 lbs. ?May use ice or moist heat for pain. ?Weight loss is of benefit. ?Best medication for lumbar disc disease is arthritis medications like diclofenac,  motrin, celebrex and naprosyn. ?Exercise is important to improve your indurance and does allow people to function better inspite of back pain. ?  ? ?Follow-Up Instructions: Return in about 6 weeks (around 08/18/2021).  ? ?Orders:  ?Orders Placed This Encounter  ?Procedures  ? XR Lumbar Spine Complete W/Bend  ? ?No orders of the defined types were placed in this encounter. ? ? ? ? Procedures: ?No procedures performed ? ? ?Clinical Data: ?No additional findings. ? ? ?Subjective: ?Chief Complaint  ?Patient presents with  ? Lower Back - Follow-up  ? ? ?66 year old male with history of L4 to S1 fusion with mild lumbar scoliosis. He retired this past January, he is 74. He is having pain into the left anterolateral hip and lateral buttock. He is taking diclofenac intermittantly as needed since being started on this about 04/2021 by Benjiman Core, PA-C. The pain he had there is better. Exercising daily. ? ? ?Review of Systems  ?Constitutional: Negative.   ?HENT: Negative.    ?Eyes: Negative.   ?Respiratory: Negative.    ?Cardiovascular: Negative.   ?Gastrointestinal: Negative.   ?Endocrine: Negative.   ?Genitourinary: Negative.   ?Musculoskeletal: Negative.   ?Skin: Negative.   ?Allergic/Immunologic: Negative.    ?Neurological: Negative.   ?Hematological: Negative.   ?Psychiatric/Behavioral: Negative.    ? ? ?Objective: ?Vital Signs: BP 116/78   Pulse 71   Ht '5\' 9"'$  (1.753 m)   Wt 184 lb (83.5 kg)   BMI 27.17 kg/m?  ? ?Physical Exam ?Constitutional:   ?   Appearance: He is well-developed.  ?HENT:  ?   Head: Normocephalic and atraumatic.  ?Eyes:  ?   Pupils: Pupils are equal, round, and reactive to light.  ?Pulmonary:  ?   Effort: Pulmonary effort is normal.  ?   Breath sounds: Normal breath sounds.  ?Abdominal:  ?   General: Bowel sounds are normal.  ?   Palpations: Abdomen is soft.  ?Musculoskeletal:  ?   Cervical back: Normal range of motion and neck supple.  ?   Lumbar back: Negative right straight leg raise test and negative left straight leg raise test.  ?Skin: ?   General: Skin is warm and dry.  ?Neurological:  ?   Mental Status: He is alert and oriented to person, place, and time.  ?Psychiatric:     ?   Behavior: Behavior normal.     ?   Thought Content: Thought content normal.     ?   Judgment: Judgment normal.  ? ? ?Back Exam  ? ?Tenderness  ?The patient  is experiencing tenderness in the lumbar. ? ?Range of Motion  ?Extension:  abnormal  ?Flexion:  abnormal  ?Lateral bend right:  abnormal  ?Lateral bend left:  abnormal  ?Rotation right:  abnormal  ?Rotation left:  abnormal  ? ?Muscle Strength  ?Right Quadriceps:  5/5  ?Left Quadriceps:  5/5  ?Right Hamstrings:  5/5  ?Left Hamstrings:  5/5  ? ?Tests  ?Straight leg raise right: negative ?Straight leg raise left: negative ? ?Reflexes  ?Patellar:  2/4 ?Achilles:  2/4 ? ?Other  ?Toe walk: normal ?Heel walk: normal ?Gait: abnormal  ?Erythema: no back redness ?Scars: absent ? ?Comments:  Tender left trochanteric bursa.  ? ? ? ? ?Specialty Comments:  ?No specialty comments available. ? ?Imaging: ?XR Lumbar Spine Complete W/Bend ? ?Result Date: 07/07/2021 ?AP and lateral flexion and extension radiographs of the lumbar spine shows fusion L4 to S1 with cages and both L4-5  and L5-S1 pedicle screws and rods at L4-5. There is moderately severe degenerative disc changes at the L3-4 and mild changes at L2-3 with anterior spurs at both of these levels and significant disc narrowing with end plate scleroosis at Y6-9. Grade 1 retrolisthesis and lateral listhesis to the right side at L3-4. Fusions at L4-5 and L5-S1 appear solid.   ? ? ?PMFS History: ?Patient Active Problem List  ? Diagnosis Date Noted  ? Spinal stenosis of lumbar region 12/05/2016  ?  Priority: High  ?  Class: Chronic  ? Spinal stenosis of lumbar region with neurogenic claudication 12/05/2016  ? Degenerative disc disease, cervical 11/21/2016  ? Spondylosis without myelopathy or radiculopathy, cervical region 11/21/2016  ? Acute appendicitis 07/20/2016  ? 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 Increased frequency of urination 02/26/2015  ? Urinary urgency 02/26/2015  ? Conjunctivitis, chemical 04/16/2013  ? Dry eye syndrome 12/12/2012  ? 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 NS (nuclear sclerosis) 10/23/2012  ? 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 ?Past Medical History:  ?Diagnosis Date  ? Degenerative disc disease, cervical 11/21/2016  ? Leg pain   ? 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 NS (nuclear sclerosis) 10/23/2012  ? 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 Overactive bladder   ? Spinal stenosis of lumbar region with neurogenic claudication 12/05/2016  ? Spondylosis without myelopathy or radiculopathy, cervical region 11/21/2016  ?  ?Family History  ?Problem Relation Age of Onset  ? High blood pressure Mother   ? Heart attack Father   ? Parkinson's disease Father   ? Stroke Paternal Grandmother   ? Bone cancer Maternal Grandfather   ?  ?Past Surgical History:  ?Procedure  Laterality Date  ? APPENDECTOMY    ? BACK SURGERY  2005  ? FOOT SURGERY Left   ? bunion  ? LAPAROSCOPIC APPENDECTOMY N/A 07/20/2016  ? Procedure: APPENDECTOMY LAPAROSCOPIC;  Surgeon: Armandina Gemma, MD;  Location: [facility];  Service: General;  Laterality: N/A;  ? LUMBAR FUSION  12/05/2016  ? ROTATOR CUFF REPAIR Bilateral   ? ?Social History  ? ?Occupational History  ? Occupation: Disability  ?Tobacco Use  ? Smoking status: Never  ? Smokeless tobacco: Never  ?Vaping Use  ? Vaping Use: Never used  ?Substance and Sexual Activity  ? Alcohol use: Yes  ?  Alcohol/week: 0.0 standard drinks  ?  Comment: occ-gin or vodka 1-2 week  ? Drug use: No  ? Sexual activity: Not on file  ? ? ? ? ? ? ?

## 2021-07-19 DIAGNOSIS — J329 Chronic sinusitis, unspecified: Secondary | ICD-10-CM | POA: Diagnosis not present

## 2021-07-19 DIAGNOSIS — J029 Acute pharyngitis, unspecified: Secondary | ICD-10-CM | POA: Diagnosis not present

## 2021-07-19 DIAGNOSIS — E663 Overweight: Secondary | ICD-10-CM | POA: Diagnosis not present

## 2021-07-19 DIAGNOSIS — Z6827 Body mass index (BMI) 27.0-27.9, adult: Secondary | ICD-10-CM | POA: Diagnosis not present

## 2021-08-18 ENCOUNTER — Ambulatory Visit: Payer: BC Managed Care – PPO | Admitting: Specialist

## 2021-09-03 ENCOUNTER — Ambulatory Visit (INDEPENDENT_AMBULATORY_CARE_PROVIDER_SITE_OTHER): Payer: Medicare Other

## 2021-09-03 ENCOUNTER — Ambulatory Visit (INDEPENDENT_AMBULATORY_CARE_PROVIDER_SITE_OTHER): Payer: Medicare Other | Admitting: Podiatry

## 2021-09-03 DIAGNOSIS — M2042 Other hammer toe(s) (acquired), left foot: Secondary | ICD-10-CM

## 2021-09-03 DIAGNOSIS — B351 Tinea unguium: Secondary | ICD-10-CM | POA: Diagnosis not present

## 2021-09-03 DIAGNOSIS — L600 Ingrowing nail: Secondary | ICD-10-CM | POA: Diagnosis not present

## 2021-09-03 NOTE — Progress Notes (Signed)
Subjective: 66 year old male presents the office today for concerns of stiffness to his toes with soreness on the nailbeds of digits 2, 3, 4 on the left foot.  He states been applying Betadine.  He feels that water gets under the nails and he gets some swelling but denies any drainage or pus.  He has had some stiffness in the toe since he had hammertoe surgery.  No recent injury.  No fevers or chills.  No other concerns.   Objective: AAO x3, NAD DP/PT pulses palpable bilaterally, CRT less than 3 seconds Flexible hammertoe repair of digits 2, 3, 4 on the left foot there is thickening of the PIPJ but no significant pain.  The nails on the same digits are hypertrophic, dystrophic with yellow discoloration and there is tenderness along nail borders.  There is no drainage or pus on the nail borders.  Mild incurvation of the nail borders.  No open lesions. No pain with calf compression, swelling, warmth, erythema  Assessment: 66 year old male with onychomycosis; status post hammertoe repair with Dr. Cannon Kettle  Plan: -All treatment options discussed with the patient including all alternatives, risks, complications.  -Discussed the stiffening result of the surgery.  Discussed different exercises that he can continue with.  He does feel that this has been helpful.  Continue supportive shoe gear -Regards the toenails we discussed different treatment options: Oral, topical as well as alternative treatments including nail removal.  He is not interested in having the nail corners or nail removed in total.  He wants to do more topical medication.  Ordered a compound cream through [facility].  This was faxed over today. -Patient encouraged to call the office with any questions, concerns, change in symptoms.   Trula Slade DPM

## 2021-09-03 NOTE — Patient Instructions (Signed)
I have ordered a medication for you that will come from [facility] in [place]. They should be calling you to verify insurance and will mail the medication to you. If you live close by then you can go by their pharmacy to pick up the medication. Their phone number is [phone]. If you do not hear from them in the next few days, please give us a call at [phone].   

## 2021-09-05 ENCOUNTER — Other Ambulatory Visit: Payer: Self-pay | Admitting: Podiatry

## 2021-09-05 MED ORDER — GABAPENTIN 100 MG PO CAPS: 100.0000 mg | ORAL_CAPSULE | Freq: Three times a day (TID) | ORAL | 1 refills | Status: DC

## 2021-09-05 NOTE — Progress Notes (Signed)
Spoke with patient over the phone this a.m. via answering service.  Patient was seen in the office 09/03/2021 at which time topical antifungal was faxed to [facility].  Patient states that he reached out to the [facility] and they had not received the fax apparently.  Patient also states that he is concerned for toe swelling and numbness and tingling sensation.  Patient has been on gabapentin in the past.  Prescription for gabapentin 100 mg 3 times daily sent to the pharmacy at [facility] [address].  Office will follow-up for the topical antifungal to [facility] on Wednesday, 09/07/2021 when the office is open again.  Explained to the patient that the topical antifungal is not emergent and he was okay to wait until Wednesday to have the cream sent to the pharmacy.

## 2021-09-20 ENCOUNTER — Telehealth: Payer: Self-pay | Admitting: *Deleted

## 2021-09-20 NOTE — Telephone Encounter (Signed)
Spoke with wife and she said that the medication was picked up on 09/05/21 from [place].

## 2021-09-20 NOTE — Telephone Encounter (Signed)
-----   Message from Edrick Kins, DPM sent at 09/05/2021 12:03 PM EDT ----- Regarding: Antifungal cream to [place],  Could you please follow-up with this patient?  Dr. Jacqualyn Posey saw him on 09/03/2021, Saturday, and faxed an order for antifungal topical sent to [facility].  Patient states that [place] never received the fax.  Could you refax Wednesday or contact [facility] to see if they have received it? Thanks, Dr. Amalia Hailey

## 2021-09-26 DIAGNOSIS — H401131 Primary open-angle glaucoma, bilateral, mild stage: Secondary | ICD-10-CM | POA: Diagnosis not present

## 2021-11-09 ENCOUNTER — Ambulatory Visit (INDEPENDENT_AMBULATORY_CARE_PROVIDER_SITE_OTHER): Payer: BC Managed Care – PPO | Admitting: Specialist

## 2021-11-09 ENCOUNTER — Encounter: Payer: Self-pay | Admitting: Specialist

## 2021-11-09 VITALS — BP 119/77 | HR 49 | Ht 69.0 in | Wt 180.0 lb

## 2021-11-09 DIAGNOSIS — M19042 Primary osteoarthritis, left hand: Secondary | ICD-10-CM

## 2021-11-09 DIAGNOSIS — Z981 Arthrodesis status: Secondary | ICD-10-CM | POA: Diagnosis not present

## 2021-11-09 DIAGNOSIS — M5136 Other intervertebral disc degeneration, lumbar region: Secondary | ICD-10-CM | POA: Diagnosis not present

## 2021-11-09 DIAGNOSIS — M4326 Fusion of spine, lumbar region: Secondary | ICD-10-CM

## 2021-11-09 DIAGNOSIS — M533 Sacrococcygeal disorders, not elsewhere classified: Secondary | ICD-10-CM

## 2021-11-09 DIAGNOSIS — M47816 Spondylosis without myelopathy or radiculopathy, lumbar region: Secondary | ICD-10-CM

## 2021-11-09 DIAGNOSIS — Z791 Long term (current) use of non-steroidal anti-inflammatories (NSAID): Secondary | ICD-10-CM | POA: Diagnosis not present

## 2021-11-09 DIAGNOSIS — M4156 Other secondary scoliosis, lumbar region: Secondary | ICD-10-CM

## 2021-11-09 DIAGNOSIS — G8929 Other chronic pain: Secondary | ICD-10-CM

## 2021-11-09 DIAGNOSIS — M19041 Primary osteoarthritis, right hand: Secondary | ICD-10-CM

## 2021-11-09 NOTE — Therapy (Signed)
OUTPATIENT PHYSICAL THERAPY EVALUATION   Patient Name: Dwayne Acosta. MRN: [id number] DOB:04-12-1955, 66 y.o., male Today's Date: 11/10/2021  END OF SESSION:   PT End of Session - 11/10/21 0839     Visit Number 1    Number of Visits 20    Date for PT Re-Evaluation 01/19/22    Authorization Type Medicare /BCBS    Progress Note Due on Visit 10    PT Start Time 0851    PT Stop Time 0925    PT Time Calculation (min) 34 min    Activity Tolerance Patient tolerated treatment well    Behavior During Therapy [facility] for tasks assessed/performed             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 10/23/2012   Overactive bladder    Spinal stenosis of lumbar region with neurogenic claudication 12/05/2016   Spondylosis without myelopathy or radiculopathy, cervical region 11/21/2016   Past Surgical History:  Procedure Laterality Date   APPENDECTOMY     BACK SURGERY  2005   FOOT SURGERY Left    bunion   LAPAROSCOPIC APPENDECTOMY N/A 07/20/2016   Procedure: APPENDECTOMY LAPAROSCOPIC;  Surgeon: Armandina Gemma, MD;  Location: [facility];  Service: General;  Laterality: N/A;   LUMBAR FUSION  12/05/2016   ROTATOR CUFF REPAIR Bilateral    Patient Active Problem List   Diagnosis Date Noted   Spinal stenosis of lumbar region 12/05/2016    Class: Chronic   Spinal stenosis of lumbar region with neurogenic claudication 12/05/2016   Degenerative disc disease, cervical 11/21/2016   Spondylosis without myelopathy or radiculopathy, cervical region 11/21/2016   Acute appendicitis 07/20/2016   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   Bladder neck obstruction 02/26/2015   Increased frequency of urination 02/26/2015   Urinary urgency  02/26/2015   Conjunctivitis, chemical 04/16/2013   Dry eye syndrome 12/12/2012   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 MD  REFERRING PROVIDER: Jessy Oto, MD  REFERRING DIAG: Z79.1 (ICD-10-CM) - NSAID long-term use M53.3,G89.29 (ICD-10-CM) - Chronic right SI joint pain M47.816 (ICD-10-CM) - Spondylosis without myelopathy or radiculopathy, lumbar region M19.041,M19.042 (ICD-10-CM) - Localized osteoarthritis of hands, bilateral  Rationale for Evaluation and Treatment Rehabilitation  THERAPY DIAG:  Other low back pain  Abnormal posture  Muscle weakness (generalized)  Difficulty in walking, not elsewhere classified  ONSET DATE: 3-4 years since surgery  SUBJECTIVE:  SUBJECTIVE STATEMENT: Pt indicated chronic history of back pain c difficulty c activity/mobility for "a while"  Had fusion about 3-4 years ago.  Pt indicated surgery took care of major troubles for leg.  Pt indicated current complaints in back are noted c prolonged inactivity and stiffness/pain noted with movement and standing (specifically in morning out of bed and standing after sitting).  Denied numbness/tingling in legs, denied bowel and bladder changes.  Uses lower lumbar pillow behind back and under buttock at times.     Pt indicated he does like to workout and had questions about what routine should he do.   History of a few visits of home health after surgery, no outpatient.   PERTINENT HISTORY:  DDD cervical, Spinal stenosis history of fusion L4-S1  PAIN:  NPRS scale: at worst 6/10, current 4-5/10 Pain location: back, spasms in Rt leg   Pain description: stiffness/tightness, spasms in leg Aggravating factors: getting up after inactivity, movement in bed, prolonged sitting in certain chairs Relieving factors: OTC, movement   PRECAUTIONS: Concurrent complaints of hand pain  (has referral for OT)  WEIGHT BEARING RESTRICTIONS No  FALLS:  Has patient fallen in last 6 months? No  LIVING ENVIRONMENT: Lives in: House/apartment Stairs: multilevel house, bedroom on first floor. A couple stairs to enter.    OCCUPATION: Retired   PLOF: Independent, gym workouts, yardwork   PATIENT GOALS What to do about pain to make it better   OBJECTIVE:   DIAGNOSTIC FINDINGS:  11/10/2021  Imaging review: Findings:  AP and lateral flexion and extension radiographs of the lumbar spine shows  fusion L4 to S1 with cages and both  L4-5 and L5-S1 pedicle screws and rods at L4-5. There is moderately severe  degenerative disc changes at the L3-4 and mild changes at L2-3 with  anterior spurs at both of these levels and significant disc narrowing with  end plate scleroosis at T2-6. Grade 1 retrolisthesis and lateral listhesis  to the right side at L3-4. Fusions at L4-5 and L5-S1 appear solid.   PATIENT SURVEYS:  11/10/2021 FOTO intake: 51   predicted:  55  SCREENING FOR RED FLAGS: 11/10/2021 Bowel or bladder incontinence: No Cauda equina syndrome: No  COGNITION: 11/10/2021  Overall cognitive status: WFL normal    SENSATION: 11/10/2021 [facility]  MUSCLE LENGTH: 11/10/2021 No specific checks today  POSTURE:  11/10/2021 Reduced lumbar lordosis in standing  PALPATION: 11/10/2021 Mild tenderness lumbar paraspinals bilaterally.   LUMBAR ROM:   Active  AROM  11/10/2021  Flexion   Extension 50 % c ERP lumbar,   Repeated x 5 in standing, improved to 75 % c reduced back pain  Right lateral flexion To knee joint c pain  Left lateral flexion To knee joint c tightness  Right rotation   Left rotation    (Blank rows = not tested)  LOWER  EXTREMITY ROM:      Right 11/10/2021 Left 11/10/2021  Hip flexion    Hip extension    Hip abduction    Hip adduction    Hip internal rotation    Hip external rotation    Knee flexion    Knee extension    Ankle dorsiflexion    Ankle plantarflexion    Ankle inversion    Ankle eversion     (Blank rows = not tested)  LOWER EXTREMITY MMT:    MMT Right 11/10/2021 Left 11/10/2021  Hip flexion 5/5 5/5  Hip extension    Hip abduction    Hip adduction  Hip internal rotation    Hip external rotation    Knee flexion 5/5 5/5  Knee extension 4/5 4/5  Ankle dorsiflexion 5/5 5/5  Ankle plantarflexion    Ankle inversion    Ankle eversion     (Blank rows = not tested)  LUMBAR SPECIAL TESTS:  11/10/2021 (-) slump bilateral  FUNCTIONAL TESTS:  11/10/2021 18 inch transfer s UE assist:  able to perform, difficulty noted  GAIT: 11/10/2021 Independent ambulation in clinic.     TODAY'S TREATMENT  11/10/2021 Therex:    HEP instruction/performance c cues for techniques, handout provided.  Trial set performed of each for comprehension and symptom assessment.  See below for exercise list    PATIENT EDUCATION:  11/10/2021 Education details: HEP, POC Person educated: Patient Education method: Explanation, Demonstration, Verbal cues, and Handouts Education comprehension: verbalized understanding, returned demonstration, and verbal cues required    HOME EXERCISE PROGRAM: Access Code: [email or web address] URL: [email or web address] Date: 11/10/2021 Prepared by: Scot Jun  Exercises - Standing Lumbar Extension with Counter  - 3-5 x daily - 7 x weekly - 1 sets - 5-10 reps - Supine Lower Trunk Rotation  - 2-3 x daily - 7 x weekly - 1 sets - 3-5 reps - 15 hold - Supine Bridge  - 1-2 x daily - 7 x weekly - 1-2 sets - 10 reps - 2 hold - Seated Straight Leg Heel Taps  - 1-2 x daily - 7 x weekly - 3 sets - 10 reps  ASSESSMENT:  CLINICAL IMPRESSION: Patient is a 66 y.o. who comes to  clinic with complaints of chronic low back pain (history of fusion) with mobility, strength and movement coordination deficits that impair their ability to perform usual daily and recreational functional activities without increase difficulty/symptoms at this time.  Patient to benefit from skilled PT services to address impairments and limitations to improve to previous level of function without restriction secondary to condition.    OBJECTIVE IMPAIRMENTS decreased activity tolerance, decreased coordination, decreased mobility, difficulty walking, decreased ROM, decreased strength, hypomobility, increased fascial restrictions, impaired perceived functional ability, increased muscle spasms, impaired flexibility, improper body mechanics, postural dysfunction, and pain.   ACTIVITY LIMITATIONS carrying, lifting, bending, sitting, standing, squatting, sleeping, transfers, bed mobility, and locomotion level  PARTICIPATION LIMITATIONS: cleaning, laundry, interpersonal relationship, community activity, and yard work  PERSONAL FACTORS Time since onset of injury/illness/exacerbation and DDD cervical, history of lumbar fusion, bilateral hand complaints concurrent  are also affecting patient's functional outcome.   REHAB POTENTIAL: Good  CLINICAL DECISION MAKING: Stable/uncomplicated  EVALUATION COMPLEXITY: Low   GOALS: Goals reviewed with patient? Yes  Short term PT Goals (target date for Short term goals are 3 weeks 12/01/2021) Patient will demonstrate independent use of home exercise program to maintain progress from in clinic treatments. Goal status: New   Long term PT goals (target dates for all long term goals are 10 weeks  01/19/2022 )   1. Patient will demonstrate/report pain at worst less than or equal to 2/10 to facilitate minimal limitation in daily activity secondary to pain symptoms. Goal status: New   2. Patient will demonstrate independent use of home exercise program to facilitate  ability to maintain/progress functional gains from skilled physical therapy services. Goal status: New   3. Patient will demonstrate FOTO outcome > or = 55 % to indicate reduced disability due to condition. Goal status: New   4. Patient will demonstrate lumbar extension 100 % WFL s symptoms to facilitate upright standing, walking posture at PLOF s  limitation. Goal status: New   5.  Patient will demonstrate bilateral LE MMT 5/5 throughout to facilitate usual daily house and yard work activity at [employer].    Goal status: New   6.  Patient will demonstrate/report ability to perform sitting/standing/walking > 30 mins s limitation due to symptoms.   Goal status: New     PLAN: PT FREQUENCY: 1-2x/week  PT DURATION: 10 weeks  PLANNED INTERVENTIONS: Therapeutic exercises, Therapeutic activity, Neuro Muscular re-education, Balance training, Gait training, Patient/Family education, Joint mobilization, Stair training, DME instructions, Dry Needling, Electrical stimulation, Cryotherapy, Moist heat, Taping, Traction Ultrasound, Ionotophoresis '4mg'$ /ml Dexamethasone, and Manual therapy.  All included unless contraindicated   PLAN FOR NEXT SESSION: Review HEP knowledge/results.  Progressive mobility improvements for lumbar/LE, strengthening progression.    Scot Jun, PT, DPT, OCS, ATC 11/10/21  9:28 AM

## 2021-11-09 NOTE — Patient Instructions (Signed)
Plan: Avoid frequent bending and stooping  No lifting greater than 10 lbs. May use ice or moist heat for pain. Weight loss is of benefit. Best medication for lumbar disc disease is arthritis medications like diclofenac,  motrin, celebrex and naprosyn. Exercise is important to improve your indurance and does allow people to function better inspite of back pain. 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  Lab today Sed rate, CRP, ANA, RF.

## 2021-11-09 NOTE — Progress Notes (Signed)
Office Visit Note   Patient: Dwayne Acosta.           Date of Birth: [birth year]           MRN: [id number] Visit Date: 11/09/2021              Requested by: Sharilyn Sites, [place] [place],  [place] [id number] PCP: Sharilyn Sites, MD   Assessment & Plan: Visit Diagnoses:  1. NSAID long-term use   2. Degenerative disc disease, lumbar   3. Other secondary scoliosis, lumbar region   4. S/P lumbar spinal fusion   5. Chronic right SI joint pain   6. Spondylosis without myelopathy or radiculopathy, lumbar region   7. Fusion of spine, lumbar region   8. Localized osteoarthritis of hands, bilateral     Plan: Plan: Avoid frequent bending and stooping  No lifting greater than 10 lbs. May use ice or moist heat for pain. Weight loss is of benefit. Best medication for lumbar disc disease is arthritis medications like diclofenac,  motrin, celebrex and naprosyn. Exercise is important to improve your indurance and does allow people to function better inspite of back pain. 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  Lab today Sed rate, CRP, ANA, RF.   Follow-Up Instructions: Return in about 6 months (around 05/10/2022) for With Dr. Laurance Flatten for follow up of DDD adjacent to L4-S1 fusion. .   Orders:  Orders Placed This Encounter  Procedures   Rheumatoid Factor   C-reactive protein   Antinuclear Antib (ANA)   Sed Rate (ESR)   Ambulatory referral to Hand Surgery   Ambulatory referral to Occupational Therapy   No orders of the defined types were placed in this encounter.     Procedures: No procedures performed   Clinical Data: Findings:  AP and lateral flexion and extension radiographs of the lumbar spine shows  fusion L4 to S1 with cages and both  L4-5 and L5-S1 pedicle screws and rods at L4-5. There is moderately severe  degenerative disc changes at the L3-4 and mild changes  at L2-3 with  anterior spurs at both of these levels and significant disc narrowing with  end plate scleroosis at B1-5. Grade 1 retrolisthesis and lateral listhesis  to the right side at L3-4. Fusions at L4-5 and L5-S1 appear solid.     Subjective: Chief Complaint  Patient presents with   Lower Back - Pain   Left Hip - Pain    66 year old male post L4 to S1 fusions for Spondylolisthesis. Has a degenerative scoliosis and lies mainly on the right side. If he lies on the left side he has pain. Does get spasm on the right thigh anteriorly intermittantly. 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 He has tried voltaren get with some improvement.     Review of Systems  Constitutional: Negative.   HENT: Negative.    Eyes: Negative.   Respiratory: Negative.    Cardiovascular: Negative.   Gastrointestinal: Negative.   Endocrine: Negative.   Genitourinary: Negative.   Musculoskeletal: Negative.   Skin: Negative.   Allergic/Immunologic: Negative.   Neurological: Negative.   Hematological: Negative.   Psychiatric/Behavioral: Negative.       Objective: Vital Signs: BP 119/77 (BP Location: Left Arm, Patient Position: Sitting)   Pulse (!) 49   Ht _0  (1.753 m)   Wt 180 lb (  81.6 kg)   BMI 26.58 kg/m   Physical Exam Constitutional:      Appearance: He is well-developed.  HENT:     Head: Normocephalic and atraumatic.  Eyes:     Pupils: Pupils are equal, round, and reactive to light.  Pulmonary:     Effort: Pulmonary effort is normal.     Breath sounds: Normal breath sounds.  Abdominal:     General: Bowel sounds are normal.     Palpations: Abdomen is soft.  Musculoskeletal:        General: Normal range of motion.     Cervical back: Normal range of motion and neck supple.  Skin:    General: Skin is warm and dry.  Neurological:     Mental Status: He is alert and oriented to person, place, and time.  Psychiatric:        Behavior:  Behavior normal.        Thought Content: Thought content normal.        Judgment: Judgment normal.     Ortho Exam  Specialty Comments:  No specialty comments available.  Imaging: No results found.   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 12/12/2012   NS (nuclear sclerosis) 10/23/2012   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 10/23/2012   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 10/23/2012   Overactive bladder    Spinal stenosis of lumbar region with neurogenic claudication 12/05/2016   Spondylosis without myelopathy or radiculopathy, cervical region 11/21/2016    Family History  Problem Relation Age of Onset   High blood pressure Mother    Heart attack Father    Parkinson's disease Father    Stroke Paternal Grandmother    Bone cancer Maternal Grandfather     Past Surgical History:  Procedure Laterality Date   APPENDECTOMY     BACK SURGERY  2005   FOOT SURGERY Left    bunion   LAPAROSCOPIC APPENDECTOMY N/A 07/20/2016   Procedure:  APPENDECTOMY LAPAROSCOPIC;  Surgeon: Armandina Gemma, MD;  Location: [facility];  Service: General;  Laterality: N/A;   LUMBAR FUSION  12/05/2016   ROTATOR CUFF REPAIR Bilateral    Social History   Occupational History   Occupation: Disability  Tobacco Use   Smoking status: Never   Smokeless tobacco: Never  Vaping Use   Vaping Use: Never used  Substance and Sexual Activity   Alcohol use: Yes    Alcohol/week: 0.0 standard drinks of alcohol    Comment: occ-gin or vodka 1-2 week   Drug use: No   Sexual activity:  Not on file

## 2021-11-10 ENCOUNTER — Ambulatory Visit (INDEPENDENT_AMBULATORY_CARE_PROVIDER_SITE_OTHER): Payer: BC Managed Care – PPO | Admitting: Rehabilitative and Restorative Service Providers"

## 2021-11-10 ENCOUNTER — Encounter: Payer: Self-pay | Admitting: Rehabilitative and Restorative Service Providers"

## 2021-11-10 ENCOUNTER — Other Ambulatory Visit: Payer: Self-pay

## 2021-11-10 DIAGNOSIS — R293 Abnormal posture: Secondary | ICD-10-CM

## 2021-11-10 DIAGNOSIS — M6281 Muscle weakness (generalized): Secondary | ICD-10-CM | POA: Diagnosis not present

## 2021-11-10 DIAGNOSIS — M25541 Pain in joints of right hand: Secondary | ICD-10-CM

## 2021-11-10 DIAGNOSIS — R262 Difficulty in walking, not elsewhere classified: Secondary | ICD-10-CM | POA: Diagnosis not present

## 2021-11-10 DIAGNOSIS — M5459 Other low back pain: Secondary | ICD-10-CM

## 2021-11-10 DIAGNOSIS — M25542 Pain in joints of left hand: Secondary | ICD-10-CM

## 2021-11-10 DIAGNOSIS — R278 Other lack of coordination: Secondary | ICD-10-CM

## 2021-11-10 NOTE — Therapy (Signed)
OUTPATIENT OCCUPATIONAL THERAPY ORTHO EVALUATION  Patient Name: Dwayne Acosta. MRN: [id number] DOB:1955-07-22, 66 y.o., male Today's Date: 11/10/2021  PCP: Sharilyn Sites MD REFERRING PROVIDER: Jessy Oto, MD   OT End of Session - 11/10/21 6213     Visit Number 1    Number of Visits 6    Date for OT Re-Evaluation 12/16/21    OT Start Time 0808    OT Stop Time 0850    OT Time Calculation (min) 42 min    Activity Tolerance Patient tolerated treatment well;No increased pain;Patient limited by fatigue;Patient limited by pain    Behavior During Therapy 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 10/23/2012   Overactive bladder    Spinal stenosis of lumbar region with neurogenic claudication 12/05/2016   Spondylosis without myelopathy or radiculopathy, cervical region 11/21/2016   Past Surgical History:  Procedure Laterality Date   APPENDECTOMY     BACK SURGERY  2005   FOOT SURGERY Left    bunion   LAPAROSCOPIC APPENDECTOMY N/A 07/20/2016   Procedure: APPENDECTOMY LAPAROSCOPIC;  Surgeon: Armandina Gemma, MD;  Location: [facility];  Service: General;  Laterality: N/A;   LUMBAR FUSION  12/05/2016   ROTATOR CUFF REPAIR Bilateral    Patient Active Problem List   Diagnosis Date Noted   Spinal stenosis of lumbar region 12/05/2016    Class: Chronic   Spinal stenosis of lumbar region with neurogenic claudication 12/05/2016   Degenerative disc disease, cervical 11/21/2016   Spondylosis without myelopathy or radiculopathy, cervical region 11/21/2016   Acute appendicitis 07/20/2016   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   Bladder neck obstruction 02/26/2015   Increased  frequency of urination 02/26/2015   Urinary urgency 02/26/2015   Conjunctivitis, chemical 04/16/2013   Dry eye syndrome 12/12/2012   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 12/12/2012   NS (nuclear sclerosis) 10/23/2012   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 10/23/2012    ONSET DATE: Chronic insidious onset   REFERRING DIAG: M19.041,M19.042 (ICD-10-CM) - Localized osteoarthritis of hands, bilateral  THERAPY DIAG:  Pain in joint of right hand - Plan: Ot plan of care cert/re-cert  Muscle weakness (generalized) - Plan: Ot plan of care cert/re-cert  Other lack of coordination - Plan: Ot plan of care cert/re-cert  Pain in joint of left hand - Plan: Ot plan of care cert/re-cert  Rationale for Evaluation and Treatment Rehabilitation  SUBJECTIVE:   SUBJECTIVE STATEMENT: He states feeling lack of strength in hands, problems opening jars, difficulties lifting weights, hardships with lawn care, etc. He's had no injury that he can recall. His issues started on Right dom side and have now begun in left hand as well.  He is also here for PT today due to low back pain from fusions, etc.    PERTINENT HISTORY: 2018 spine fusion, past b/l RTC repair  PRECAUTIONS: None   WEIGHT BEARING RESTRICTIONS No   PAIN:  Are you having pain? Yes Rating: 3-4/10 at rest now in b/l thumb MCP Js and CMC Js, up to 6-7/10 at worst  FALLS: Has patient fallen in last 6 months? No  PLOF: Independent  PATIENT GOALS to manage pains in both hands/thumbs   OBJECTIVE:   HAND DOMINANCE: Right   ADLs: Overall ADLs: States decreased ability to grab, hold household objects, pain and inability to open containers, perform all FMS tasks, etc.    FUNCTIONAL OUTCOME MEASURES: Eval: Patient Specific Functional Scale: TBD (TBD)  UPPER EXTREMITY ROM     Active ROM Right eval Left eval  Wrist flexion 80 74  Wrist extension 75 70  (Blank  rows = not tested)  Active ROM Right eval Left eval  Thumb MCP (0-60) 57 49  Thumb IP (0-80) 72 78  Thumb Radial abd/add (0-55)     Thumb Palmar abd/add (0-45) 57  57  Thumb Opposition to Small Finger     Index MCP (0-90)     Index PIP (0-100)     Index DIP (0-70)      (Blank rows = not tested)   UPPER EXTREMITY MMT:    Eval: at least 4-/5 in b/l UEs with pain in thumbs with various weight bearing positions.    HAND FUNCTION: Eval: Grip strength Right: 104 lbs, Left: 92 lbs   COORDINATION: Eval: Seems WFL GMS but FMS can be painful and difficult with pinches and opening bottles, etc.  SENSATION: Eval:  Light touch intact today  OBSERVATIONS:   Eval: b/l CMC J, MCP J and IF MCP Js are significantly swollen looking and mildly TTP; positive grind test b/l CMC Js    TODAY'S TREATMENT:  Eval:  OT starts with body mechanics education and edu to avoid exacerbating factors, increase body awareness, prevent problems. OT measures him for pre-fab thumb comfy cool CMC brace and recommends this for lifting and tasks that require freedom of motion.  OT also begins education on exercise program "Stable - C" that can help with management of thumb OA.  We are able to make it through the first 4 exercises listed below.  He states feeling non-painful stretches, understanding the plan so far.   Exercises - Seated Wrist Flexion Stretch  - 2-3 x daily - 3 reps - 15 hold - Wrist Extension Stretch Pronated  - 2-3 x daily - 3 reps - 15 hold - Stretch thumb downward   - 2-3 x daily - 3-5 reps - 15 sec hold - Stretch Thumb into "C-Shape" (don't use other thumb)  - 2-3 x daily - 3-5 reps - 15 sec hold - Towel Roll Grip with Forearm in Neutral  - 2-3 x daily - 3-5 reps - 10 sec hold - Resisted Finger Abduction - Index and Middle  - 2-3 x daily - 5-10 reps - 2-3 sec hold - C-Strength (try using rubber band)   - 2-3 x daily - 5-10 reps - 2-3 sec hold - Thumb Opposition  - 2-3 x daily - 10  reps   PATIENT EDUCATION: Education details: See tx section above for details  Person educated: Patient Education method: Verbal Instruction, Teach back, Handouts  Education comprehension: States and demonstrates understanding, Additional Education required    HOME EXERCISE PROGRAM: Access Code: [email or web address] URL: [email or web address] Date: 11/10/2021 Prepared by: Benito Mccreedy  GOALS: Goals reviewed with patient? Yes   SHORT TERM GOALS: (STG required if POC>30 days)  Pt will obtain protective, custom orthotic. Target date: 11/26/21 Goal status: INITIAL  2.  Pt will demo/state understanding of initial HEP to improve pain levels and prerequisite motion. Target date: 11/18/21 Goal status: INITIAL  LONG TERM GOALS:  Pt will improve functional ability by decreased impairment per PSFS assessment from TBD to TBD or better, for better quality of life. Target date: 12/16/21 Goal status: INITIAL  2.   Pt will decrease pain at worst from 6-7/10 to 2-3/10 or better to have better sleep and occupational participation in daily roles. Target date: 12/16/21 Goal status: INITIAL    ASSESSMENT:  CLINICAL IMPRESSION: Patient is a 66 y.o. male who was seen today for occupational therapy evaluation for b/l hand/thumb pain and decreased functional ability. He will benefit from OP OT.    PERFORMANCE DEFICITS in functional skills including ADLs, IADLs, dexterity, edema, pain, FMC, body mechanics, decreased knowledge of precautions, and UE functional use, cognitive skills including problem solving and safety awareness, and psychosocial skills including coping strategies, environmental adaptation, and habits.   IMPAIRMENTS are limiting patient from ADLs, IADLs, and play.   COMORBIDITIES has co-morbidities such as DJD, nuclear sclerosis, spondylosis, and others  that affects occupational performance. Patient will benefit from skilled OT to address above impairments and improve  overall function.  MODIFICATION OR ASSISTANCE TO COMPLETE EVALUATION: No modification of tasks or assist necessary to complete an evaluation.  OT OCCUPATIONAL PROFILE AND HISTORY: Problem focused assessment: Including review of records relating to presenting problem.  CLINICAL DECISION MAKING: LOW - limited treatment options, no task modification necessary  REHAB POTENTIAL: Good  EVALUATION COMPLEXITY: Low      PLAN: OT FREQUENCY: 1-2x/week  OT DURATION: other: 5 weeks, through 12/16/21  PLANNED INTERVENTIONS: self care/ADL training, therapeutic exercise, therapeutic activity, manual therapy, passive range of motion, splinting, ultrasound, compression bandaging, moist heat, cryotherapy, contrast bath, patient/family education, and coping strategies training  RECOMMENDED OTHER SERVICES: He will be receiving PT for back pain and problems beginning today as well.   CONSULTED AND AGREED WITH PLAN OF CARE: Patient  PLAN FOR NEXT SESSION:  Make right hand-based [facility] & MCP J orthotic to trial use, review HEP and finish Stable-C program.   Benito Mccreedy, OTR/L, CHT 11/10/2021, 11:10 AM

## 2021-11-11 ENCOUNTER — Ambulatory Visit: Payer: Medicare Other | Admitting: Rehabilitative and Restorative Service Providers"

## 2021-11-14 ENCOUNTER — Encounter: Payer: Self-pay | Admitting: Rehabilitative and Restorative Service Providers"

## 2021-11-14 ENCOUNTER — Ambulatory Visit (INDEPENDENT_AMBULATORY_CARE_PROVIDER_SITE_OTHER): Payer: BC Managed Care – PPO | Admitting: Rehabilitative and Restorative Service Providers"

## 2021-11-14 DIAGNOSIS — M25541 Pain in joints of right hand: Secondary | ICD-10-CM | POA: Diagnosis not present

## 2021-11-14 DIAGNOSIS — M25542 Pain in joints of left hand: Secondary | ICD-10-CM | POA: Diagnosis not present

## 2021-11-14 DIAGNOSIS — M6281 Muscle weakness (generalized): Secondary | ICD-10-CM

## 2021-11-14 DIAGNOSIS — R278 Other lack of coordination: Secondary | ICD-10-CM | POA: Diagnosis not present

## 2021-11-14 NOTE — Therapy (Signed)
OUTPATIENT OCCUPATIONAL THERAPY TREATMENT NOTE  Patient Name: Dwayne Hunkele Jr. MRN: [id number] DOB:08/31/1955, 65 y.o., male Today's Date: 11/14/2021  PCP: Golding, Kamden MD REFERRING PROVIDER: Nitka, James E, MD   OT End of Session - 11/14/21 0900     Visit Number 2    Number of Visits 6    Date for OT Re-Evaluation 12/16/21    OT Start Time 0901    OT Stop Time 1002    OT Time Calculation (min) 61 min    Activity Tolerance Patient tolerated treatment well;No increased pain;Patient limited by fatigue;Patient limited by pain    Behavior During Therapy WFL for tasks assessed/performed              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 10/23/2012   Overactive bladder    Spinal stenosis of lumbar region with neurogenic claudication 12/05/2016   Spondylosis without myelopathy or radiculopathy, cervical region 11/21/2016   Past Surgical History:  Procedure Laterality Date   APPENDECTOMY     BACK SURGERY  2005   FOOT SURGERY Left    bunion   LAPAROSCOPIC APPENDECTOMY N/A 07/20/2016   Procedure: APPENDECTOMY LAPAROSCOPIC;  Surgeon: Gerkin, Todd, MD;  Location: [facility];  Service: General;  Laterality: N/A;   LUMBAR FUSION  12/05/2016   ROTATOR CUFF REPAIR Bilateral    Patient Active Problem List   Diagnosis Date Noted   Spinal stenosis of lumbar region 12/05/2016    Class: Chronic   Spinal stenosis of lumbar region with neurogenic claudication 12/05/2016   Degenerative disc disease, cervical 11/21/2016   Spondylosis without myelopathy or radiculopathy, cervical region 11/21/2016   Acute appendicitis 07/20/2016   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   Bladder neck obstruction 02/26/2015   Increased  frequency of urination 02/26/2015   Urinary urgency 02/26/2015   Conjunctivitis, chemical 04/16/2013   Dry eye syndrome 12/12/2012   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 12/12/2012   NS (nuclear sclerosis) 10/23/2012   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  Attempting HEP but a little worried he isn't doing it correctly.    PAIN:  Are you having pain? Yes  Rating: 1-2/10 at rest now in b/l thumb MCP Js and CMC Js    OBJECTIVE:   HAND DOMINANCE: Right   ADLs: Overall ADLs: States decreased ability to grab, hold household objects, pain and inability to open containers, perform all FMS tasks, etc.    FUNCTIONAL OUTCOME MEASURES: Eval: Patient Specific Functional Scale: 5.2 (eye drop vial, push toothbrush button, weight lifting routines)  UPPER EXTREMITY ROM     Active ROM Right eval   Left eval  Wrist flexion 80 74  Wrist extension 75 70  (Blank rows = not tested)  Active ROM Right eval Left eval  Thumb MCP (0-60) 57 49  Thumb IP (0-80) 72 78  Thumb Radial abd/add (0-55)     Thumb Palmar abd/add (0-45) 57  57  Thumb Opposition to Small Finger     Index MCP (0-90)     Index PIP (0-100)     Index DIP (0-70)      (Blank rows = not tested)   UPPER EXTREMITY MMT:    Eval: at least 4-/5 in b/l UEs with pain in thumbs with various weight bearing  positions.    HAND FUNCTION: Eval: Grip strength Right: 104 lbs, Left: 92 lbs   COORDINATION: Eval: Seems WFL GMS but FMS can be painful and difficult with pinches and opening bottles, etc.  SENSATION: Eval:  Light touch intact today  OBSERVATIONS:   Eval: b/l CMC J, MCP J and IF MCP Js are significantly swollen looking and mildly TTP; positive grind test b/l CMC Js    TODAY'S TREATMENT:  11/14/21: Custom orthotic fabrication was indicated due to pt's painful, arthritic right thumb and need for safe, functional positioning. OT fabricated custom hand-based thumb spica, leaving IP J free for pt today to immobilize [facility] J and MCP J as needed during stressful tasks. It fit well with no areas of pressure, pt states a comfortable fit. Pt was educated on the wearing schedule, to call or come in ASAP if it is causing any irritation or is not achieving desired function. It will be checked/adjusted in upcoming sessions, as needed. Pt states understanding.   Additionally, OT discusses home tasks that are painful to him: FMS opening small bottles, etc., and OT gives non-slip dycem to help with tasks and suggests modifications to those tasks.  OT also reviews HEP and adds remaining strengthening portions for thumb opponens, 1st dorsal interossei, etc., as listed below. He tolerates these well, states no added pain, feeling pretty good. He states the stretch review was helpful because he did have some confusion with it.   Exercises - Seated Wrist Flexion Stretch  - 2-3 x daily - 3 reps - 15 hold - Wrist Extension Stretch Pronated  - 2-3 x daily - 3 reps - 15 hold - Stretch thumb downward   - 2-3 x daily - 3-5 reps - 15 sec hold - Stretch Thumb into "C-Shape" (don't use other thumb)  - 2-3 x daily - 3-5 reps - 15 sec hold - Towel Roll Grip with Forearm in Neutral  - 2-3 x daily - 3-5 reps - 10 sec hold - Resisted Finger Abduction - Index and Middle  - 2-3 x daily - 5-10 reps - 2-3 sec hold - C-Strength  (try using rubber band)   - 2-3 x daily - 5-10 reps - 2-3 sec hold - Thumb Opposition  - 2-3 x daily - 10 reps    Eval:  OT starts with body mechanics education and edu to avoid exacerbating factors, increase body awareness, prevent problems. OT measures him for pre-fab thumb comfy cool CMC brace and recommends this for lifting and tasks that require freedom of motion.  OT also begins education on exercise program "Stable - C" that can help with management of thumb OA.  We are able to make it through the first 4 exercises listed below.  He states feeling non-painful stretches, understanding the plan so far.   Exercises - Seated Wrist  Flexion Stretch  - 2-3 x daily - 3 reps - 15 hold - Wrist Extension Stretch Pronated  - 2-3 x daily - 3 reps - 15 hold - Stretch thumb downward   - 2-3 x daily - 3-5 reps - 15 sec hold - Stretch Thumb into "C-Shape" (don't use other thumb)  - 2-3 x daily - 3-5 reps - 15 sec hold   PATIENT EDUCATION: Education details: See tx section above for details  Person educated: Patient Education method: Verbal Instruction, Teach back, Handouts  Education comprehension: States and demonstrates understanding, Additional Education required    HOME EXERCISE PROGRAM: Access Code: [email or web address] URL: [email or web address]   GOALS: Goals reviewed with patient? Yes   SHORT TERM GOALS: (STG required if POC>30 days)  Pt will obtain protective, custom orthotic. Target date: 11/26/21 Goal status: MET  2.  Pt will demo/state understanding of initial HEP to improve pain levels and prerequisite motion. Target date: 11/18/21 Goal status: INITIAL   LONG TERM GOALS:  Pt will improve functional ability by decreased impairment per PSFS assessment from TBD to TBD or better, for better quality of life. Target date: 12/16/21 Goal status: INITIAL  2.   Pt will decrease pain at worst from 6-7/10 to 2-3/10 or better to have better sleep and occupational participation in  daily roles. Target date: 12/16/21 Goal status: INITIAL    ASSESSMENT:  CLINICAL IMPRESSION: 11/14/21: He will attempt to manage this thumb issue now independently for 2 weeks, then return for status check and possible D/C from therapy if no further services needed.    Eval: Patient is a 65 y.o. male who was seen today for occupational therapy evaluation for b/l hand/thumb pain and decreased functional ability. He will benefit from OP OT.     PLAN: OT FREQUENCY: 1-2x/week  OT DURATION: other: 5 weeks, through 12/16/21  PLANNED INTERVENTIONS: self care/ADL training, therapeutic exercise, therapeutic activity, manual therapy, passive range of motion, splinting, ultrasound, compression bandaging, moist heat, cryotherapy, contrast bath, patient/family education, and coping strategies training  RECOMMENDED OTHER SERVICES: He will be receiving PT for back pain and problems beginning today as well.   CONSULTED AND AGREED WITH PLAN OF CARE: Patient  PLAN FOR NEXT SESSION:  Consider doing weight lifting tasks together, fnl activities.  Consider if  orthotic needed for left hand as well.  Reassess and possibly D/C    Nathanael Moore, OTR/L, CHT 11/14/2021, 10:13 AM   

## 2021-11-17 ENCOUNTER — Ambulatory Visit (INDEPENDENT_AMBULATORY_CARE_PROVIDER_SITE_OTHER): Payer: BC Managed Care – PPO | Admitting: Rehabilitative and Restorative Service Providers"

## 2021-11-17 ENCOUNTER — Encounter: Payer: Self-pay | Admitting: Rehabilitative and Restorative Service Providers"

## 2021-11-17 DIAGNOSIS — R293 Abnormal posture: Secondary | ICD-10-CM

## 2021-11-17 DIAGNOSIS — M6281 Muscle weakness (generalized): Secondary | ICD-10-CM | POA: Diagnosis not present

## 2021-11-17 DIAGNOSIS — M5459 Other low back pain: Secondary | ICD-10-CM | POA: Diagnosis not present

## 2021-11-17 DIAGNOSIS — R262 Difficulty in walking, not elsewhere classified: Secondary | ICD-10-CM | POA: Diagnosis not present

## 2021-11-17 NOTE — Therapy (Signed)
OUTPATIENT PHYSICAL THERAPY TREATMENT NOTE   Patient Name: Dwayne Acosta. MRN: [id number] DOB:1955-07-22, 66 y.o., male Today's Date: 11/17/2021  PCP: Sharilyn Sites. MD REFERRING PROVIDER: Jessy Oto, MD  END OF SESSION:   PT End of Session - 11/17/21 0821     Visit Number 2    Number of Visits 20    Date for PT Re-Evaluation 01/19/22    Authorization Type Medicare /BCBS    Progress Note Due on Visit 10    PT Start Time 0813    PT Stop Time 4163    PT Time Calculation (min) 34 min    Activity Tolerance Patient tolerated treatment well    Behavior During Therapy [facility] for tasks assessed/performed             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 10/23/2012   Overactive bladder    Spinal stenosis of lumbar region with neurogenic claudication 12/05/2016   Spondylosis without myelopathy or radiculopathy, cervical region 11/21/2016   Past Surgical History:  Procedure Laterality Date   APPENDECTOMY     BACK SURGERY  2005   FOOT SURGERY Left    bunion   LAPAROSCOPIC APPENDECTOMY N/A 07/20/2016   Procedure: APPENDECTOMY LAPAROSCOPIC;  Surgeon: Armandina Gemma, MD;  Location: [facility];  Service: General;  Laterality: N/A;   LUMBAR FUSION  12/05/2016   ROTATOR CUFF REPAIR Bilateral    Patient Active Problem List   Diagnosis Date Noted   Spinal stenosis of lumbar region 12/05/2016    Class: Chronic   Spinal stenosis of lumbar region with neurogenic claudication 12/05/2016   Degenerative disc disease, cervical 11/21/2016   Spondylosis without myelopathy or radiculopathy, cervical region 11/21/2016   Acute appendicitis 07/20/2016   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   Bladder neck obstruction  02/26/2015   Increased frequency of urination 02/26/2015   Urinary urgency 02/26/2015   Conjunctivitis, chemical 04/16/2013   Dry eye syndrome 12/12/2012   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 12/12/2012   NS (nuclear sclerosis) 10/23/2012   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 10/23/2012    REFERRING DIAG: Z79.1 (ICD-10-CM) - NSAID long-term use M53.3,G89.29 (ICD-10-CM) - Chronic right SI joint pain M47.816 (ICD-10-CM) - Spondylosis without myelopathy or radiculopathy, lumbar region M19.041,M19.042 (ICD-10-CM) - Localized osteoarthritis of hands, bilateral  THERAPY DIAG:  Other low back pain  Abnormal posture  Muscle weakness (generalized)  Difficulty in walking, not elsewhere classified  Rationale for Evaluation and Treatment Rehabilitation  ONSET DATE: 3-4 years since surgery   SUBJECTIVE:  SUBJECTIVE STATEMENT: [name] he went to the gym and biked for 25 minutes on max resistance and did chest exercises with free weights, and noticed RLE muscle spasms upon leaving the gym. After getting home he cut up a tree limb that had fallen in his yard and sat in a deep chair and noticed intense back pain upon standing up from that. Sx relieved with a heating pad then doing the stretching exercises, and he is feeling better this morning.   He asked about a lumbar extension machine at the gym, hex bar deadlifts, weighted retro walking with belt around the low back, hanging stretch for spine, brief muscle spasms during exercise, and the seated SLR exercise.   PERTINENT HISTORY:  DDD cervical, Spinal stenosis history of fusion L4-S1   PAIN:  NPRS scale: at worst 8/10, current 2/10 Pain location: back, spasms in Rt leg  Pain description:  stiffness/tightness, spasms in leg Aggravating factors: getting up after inactivity, movement in bed, prolonged sitting in certain chairs Relieving factors: OTC, movement     PRECAUTIONS: Concurrent complaints of hand pain  (has referral for OT)   WEIGHT BEARING RESTRICTIONS No   FALLS:  Has patient fallen in last 6 months? No   LIVING ENVIRONMENT: Lives in: House/apartment Stairs: multilevel house, bedroom on first floor. A couple stairs to enter.      OCCUPATION: Retired    PLOF: Independent, gym workouts, yardwork    PATIENT GOALS What to do about pain to make it better     OBJECTIVE:    DIAGNOSTIC FINDINGS:  11/10/2021   Imaging review: Findings:  AP and lateral flexion and extension radiographs of the lumbar spine shows  fusion L4 to S1 with cages and both  L4-5 and L5-S1 pedicle screws and rods at L4-5. There is moderately severe  degenerative disc changes at the L3-4 and mild changes at L2-3 with  anterior spurs at both of these levels and significant disc narrowing with  end plate scleroosis at P8-2. Grade 1 retrolisthesis and lateral listhesis  to the right side at L3-4. Fusions at L4-5 and L5-S1 appear solid.    PATIENT SURVEYS:  11/10/2021 FOTO intake: 51   predicted:  55   SCREENING FOR RED FLAGS: 11/10/2021 Bowel or bladder incontinence: No Cauda equina syndrome: No   COGNITION: 11/10/2021            Overall cognitive status: WFL normal              SENSATION: 11/10/2021 [facility]   MUSCLE LENGTH: 11/10/2021 No specific checks today   POSTURE:  11/10/2021 Reduced lumbar lordosis in standing   PALPATION: 11/10/2021 Mild tenderness lumbar paraspinals bilaterally.    LUMBAR ROM:    Active  AROM  11/10/2021  Flexion    Extension 50 % c ERP lumbar,    Repeated x 5 in standing, improved to 75 % c reduced back pain  Right lateral flexion To knee joint c pain  Left lateral flexion To knee joint c tightness  Right rotation    Left rotation     (Blank rows =  not tested)   LOWER EXTREMITY ROM:        Right 11/10/2021 Left 11/10/2021  Hip flexion      Hip extension      Hip abduction      Hip adduction      Hip internal rotation      Hip external rotation      Knee flexion  Knee extension      Ankle dorsiflexion      Ankle plantarflexion      Ankle inversion      Ankle eversion       (Blank rows = not tested)   LOWER EXTREMITY MMT:     MMT Right 11/10/2021 Left 11/10/2021  Hip flexion 5/5 5/5  Hip extension      Hip abduction      Hip adduction      Hip internal rotation      Hip external rotation      Knee flexion 5/5 5/5  Knee extension 4/5 4/5  Ankle dorsiflexion 5/5 5/5  Ankle plantarflexion      Ankle inversion      Ankle eversion       (Blank rows = not tested)   LUMBAR SPECIAL TESTS:  11/10/2021 (-) slump bilateral   FUNCTIONAL TESTS:  11/17/2021:  Functional squat over chair 18 inch height:  Movement good until halfway, then increased lumbar flexion and decreased control.   11/10/2021 18 inch transfer s UE assist:  able to perform, difficulty noted   GAIT: 11/10/2021 Independent ambulation in clinic.        TODAY'S TREATMENT  11/17/2021 TherEx:  Nustep level 5 for 7 minutes BLEs only  SLR x 10 bil. with additional cueing for technique  Squat to chair height with slow eccentric x 6 with cueing for neutral spine and education on progression/regression at home without weighted bar at gym  Prone alternating arm and leg lifts x 4 bil.   HEP updated, printed. PT educated on pain management with balance of rest, heat, and exercise, discussion of machine options at gym with safe and appropriate spine and LE loading. Pt verbalized understanding    11/10/2021 Therex:    HEP instruction/performance c cues for techniques, handout provided.  Trial set performed of each for comprehension and symptom assessment.  See below for exercise list       PATIENT EDUCATION:  11/17/2021 Education details: HEP update, general gym  based education on techniques.  Person educated: Patient Education method: Explanation, Demonstration, Verbal cues, and Handouts Education comprehension: verbalized understanding, returned demonstration, and verbal cues required       HOME EXERCISE PROGRAM: Access Code: [email or web address] URL: [email or web address] Date: 11/17/2021 Prepared by: Scot Jun   Exercises - Standing Lumbar Extension with Counter  - 3-5 x daily - 7 x weekly - 1 sets - 5-10 reps - Supine Lower Trunk Rotation  - 2-3 x daily - 7 x weekly - 1 sets - 3-5 reps - 15 hold - Supine Bridge  - 1-2 x daily - 7 x weekly - 1-2 sets - 10 reps - 2 hold - Seated Straight Leg Heel Taps  - 1-2 x daily - 7 x weekly - 3 sets - 10 reps - Sit to Stand  - 3 x daily - 7 x weekly - 1 sets - 10 reps - Prone Alternating Arm and Leg Lifts  - 1-2 x daily - 7 x weekly - 1-2 sets - 10 reps - 3 hold   ASSESSMENT:   CLINICAL IMPRESSION: He presented feeling well following a high increase in pain yesterday with a combination of high activity and poor seated posture, noting that sx improved with heat and exercises and pt education included doing those stretches as frequently as needed. HEP was updated to include additional exercise for lumbar and LE strengthening with education on machine options at gym for appropriate strengthening. He continues  to benefit from skilled PT to address functional limitations.   OBJECTIVE IMPAIRMENTS decreased activity tolerance, decreased coordination, decreased mobility, difficulty walking, decreased ROM, decreased strength, hypomobility, increased fascial restrictions, impaired perceived functional ability, increased muscle spasms, impaired flexibility, improper body mechanics, postural dysfunction, and pain.    ACTIVITY LIMITATIONS carrying, lifting, bending, sitting, standing, squatting, sleeping, transfers, bed mobility, and locomotion level   PARTICIPATION LIMITATIONS: cleaning, laundry,  interpersonal relationship, community activity, and yard work   PERSONAL FACTORS Time since onset of injury/illness/exacerbation and DDD cervical, history of lumbar fusion, bilateral hand complaints concurrent  are also affecting patient's functional outcome.    REHAB POTENTIAL: Good   CLINICAL DECISION MAKING: Stable/uncomplicated   EVALUATION COMPLEXITY: Low     GOALS: Goals reviewed with patient? Yes   Short term PT Goals (target date for Short term goals are 3 weeks 12/01/2021) Patient will demonstrate independent use of home exercise program to maintain progress from in clinic treatments. Goal status: on going - assessed 11/17/2021   Long term PT goals (target dates for all long term goals are 10 weeks  01/19/2022 )   1. Patient will demonstrate/report pain at worst less than or equal to 2/10 to facilitate minimal limitation in daily activity secondary to pain symptoms. Goal status: New   2. Patient will demonstrate independent use of home exercise program to facilitate ability to maintain/progress functional gains from skilled physical therapy services. Goal status: New   3. Patient will demonstrate FOTO outcome > or = 55 % to indicate reduced disability due to condition. Goal status: New   4. Patient will demonstrate lumbar extension 100 % WFL s symptoms to facilitate upright standing, walking posture at PLOF s limitation. Goal status: New   5.  Patient will demonstrate bilateral LE MMT 5/5 throughout to facilitate usual daily house and yard work activity at [employer].    Goal status: New   6.  Patient will demonstrate/report ability to perform sitting/standing/walking > 30 mins s limitation due to symptoms.   Goal status: New       PLAN: PT FREQUENCY: 1-2x/week   PT DURATION: 10 weeks   PLANNED INTERVENTIONS: Therapeutic exercises, Therapeutic activity, Neuro Muscular re-education, Balance training, Gait training, Patient/Family education, Joint mobilization, Stair  training, DME instructions, Dry Needling, Electrical stimulation, Cryotherapy, Moist heat, Taping, Traction Ultrasound, Ionotophoresis '4mg'$ /ml Dexamethasone, and Manual therapy.  All included unless contraindicated     PLAN FOR NEXT SESSION: Continue mobility improvements and progressive strengthening with introduction of machine options in clinic   [facility], Student-PT 11/17/2021, 9:04 AM  This entire session of physical therapy was performed under the direct supervision of PT signing evaluation /treatment. PT reviewed note and agrees.  Scot Jun, PT, DPT, OCS, ATC 11/17/2021, 9:12 AM

## 2021-11-20 DIAGNOSIS — S0502XA Injury of conjunctiva and corneal abrasion without foreign body, left eye, initial encounter: Secondary | ICD-10-CM | POA: Diagnosis not present

## 2021-11-21 DIAGNOSIS — S0502XD Injury of conjunctiva and corneal abrasion without foreign body, left eye, subsequent encounter: Secondary | ICD-10-CM | POA: Diagnosis not present

## 2021-11-22 ENCOUNTER — Encounter: Payer: Medicare Other | Admitting: Rehabilitative and Restorative Service Providers"

## 2021-11-22 ENCOUNTER — Ambulatory Visit (INDEPENDENT_AMBULATORY_CARE_PROVIDER_SITE_OTHER): Payer: BC Managed Care – PPO | Admitting: Rehabilitative and Restorative Service Providers"

## 2021-11-22 ENCOUNTER — Encounter: Payer: Self-pay | Admitting: Rehabilitative and Restorative Service Providers"

## 2021-11-22 DIAGNOSIS — R262 Difficulty in walking, not elsewhere classified: Secondary | ICD-10-CM

## 2021-11-22 DIAGNOSIS — R293 Abnormal posture: Secondary | ICD-10-CM | POA: Diagnosis not present

## 2021-11-22 DIAGNOSIS — M6281 Muscle weakness (generalized): Secondary | ICD-10-CM

## 2021-11-22 DIAGNOSIS — M5459 Other low back pain: Secondary | ICD-10-CM | POA: Diagnosis not present

## 2021-11-22 NOTE — Therapy (Signed)
OUTPATIENT PHYSICAL THERAPY TREATMENT NOTE   Patient Name: Dwayne Acosta. MRN: [id number] DOB:Jan 14, 1956, 66 y.o., male Today's Date: 11/22/2021  PCP: Sharilyn Sites. 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 10/23/2012   Overactive bladder    Spinal stenosis of lumbar region with neurogenic claudication 12/05/2016   Spondylosis without myelopathy or radiculopathy, cervical region 11/21/2016   Past Surgical History:  Procedure Laterality Date   APPENDECTOMY     BACK SURGERY  2005   FOOT SURGERY Left    bunion   LAPAROSCOPIC APPENDECTOMY N/A 07/20/2016   Procedure: APPENDECTOMY LAPAROSCOPIC;  Surgeon: Armandina Gemma, MD;  Location: [facility];  Service: General;  Laterality: N/A;   LUMBAR FUSION  12/05/2016   ROTATOR CUFF REPAIR Bilateral    Patient Active Problem List   Diagnosis Date Noted   Spinal stenosis of lumbar region 12/05/2016    Class: Chronic   Spinal stenosis of lumbar region with neurogenic claudication 12/05/2016   Degenerative disc disease, cervical 11/21/2016   Spondylosis without myelopathy or radiculopathy, cervical region 11/21/2016   Acute appendicitis 07/20/2016   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   Bladder neck obstruction  02/26/2015   Increased frequency of urination 02/26/2015   Urinary urgency 02/26/2015   Conjunctivitis, chemical 04/16/2013   Dry eye syndrome 12/12/2012   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 12/12/2012   NS (nuclear sclerosis) 10/23/2012   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  Pt indicated he scratched his cornea.  Pt indicated last week was a busy week so exercises haven't been done as much.  Pt mentioned Rt hand.  Pt indicated riding the bike some again and reported spasms in muscles in Rt leg/hip.   Ice helped afterward.  Pt indicated having some pain after exercise.    PERTINENT HISTORY:  DDD cervical, Spinal stenosis history of fusion L4-S1   PAIN:  NPRS scale: at worst 5/10 Pain location: back Pain description: stiffness/tightness, spasms in leg Aggravating factors: after exercise Relieving factors: OTC, movement, ice      PRECAUTIONS: Concurrent complaints of hand pain  (has referral for OT)   WEIGHT BEARING RESTRICTIONS No   FALLS:  Has patient fallen in last 6 months? No   LIVING ENVIRONMENT: Lives in:  House/apartment Stairs: multilevel house, bedroom on first floor. A couple stairs to enter.      OCCUPATION: Retired    PLOF: Independent, gym workouts, yardwork    PATIENT GOALS What to do about pain to make it better     OBJECTIVE:    DIAGNOSTIC FINDINGS:  11/10/2021   Imaging review: Findings:  AP and lateral flexion and extension radiographs of the lumbar spine shows  fusion L4 to S1 with cages and both  L4-5 and L5-S1 pedicle screws and rods at L4-5. There is moderately severe  degenerative disc changes at the L3-4 and mild changes at L2-3 with  anterior spurs at both of these levels and significant disc narrowing with  end plate scleroosis at O1-7. Grade 1 retrolisthesis and lateral listhesis  to the right side at L3-4. Fusions at L4-5 and L5-S1 appear solid.    PATIENT SURVEYS:  11/10/2021 FOTO intake: 51   predicted:  55   SCREENING FOR RED FLAGS: 11/10/2021 Bowel or bladder incontinence: No Cauda equina syndrome: No   COGNITION: 11/10/2021            Overall cognitive status: WFL normal              SENSATION: 11/10/2021 [facility]   MUSCLE LENGTH: 11/10/2021 No specific checks today   POSTURE:  11/10/2021 Reduced lumbar lordosis in standing   PALPATION: 11/10/2021 Mild tenderness lumbar paraspinals bilaterally.    LUMBAR ROM:    Active  AROM  11/10/2021  Flexion    Extension 50 % c ERP lumbar,    Repeated x 5 in standing, improved to 75 % c reduced back pain  Right lateral flexion To knee joint c pain  Left lateral flexion To knee joint c tightness  Right rotation    Left rotation     (Blank rows = not tested)   LOWER EXTREMITY ROM:        Right 11/10/2021 Left 11/10/2021  Hip flexion      Hip extension      Hip abduction      Hip adduction      Hip internal rotation      Hip external rotation      Knee flexion      Knee extension      Ankle dorsiflexion      Ankle plantarflexion      Ankle inversion      Ankle eversion       (Blank rows = not  tested)   LOWER EXTREMITY MMT:     MMT Right 11/10/2021 Left 11/10/2021 Right 11/22/2021 Left 11/22/2021  Hip flexion 5/5 5/5  Hip extension        Hip abduction        Hip adduction        Hip internal rotation        Hip external rotation        Knee flexion 5/5 5/5    Knee extension 4/5 4/5 4/5 5/5  Ankle dorsiflexion 5/5 5/5    Ankle plantarflexion        Ankle inversion        Ankle eversion         (Blank rows = not tested)   LUMBAR SPECIAL TESTS:  11/10/2021 (-) slump bilateral   FUNCTIONAL TESTS:  11/17/2021:  Functional squat over chair 18 inch height:  Movement good until halfway, then increased lumbar flexion and decreased control.   11/10/2021 18 inch transfer s UE assist:  able to perform, difficulty noted   GAIT: 11/10/2021 Independent ambulation in clinic.       TODAY'S TREATMENT  11/22/2021 TherEx:  Recumbent bike on UBE lvl 5.0 10 mins 50-60 rpm  (cues given for adjustment on exercise on bike at home to reduced resistance amount for improvement of leg symptoms and changing to recumbent bike instead of upright bike for back complaints - additional time spent in education    Leg press Double leg 125 lbs 2 x 15, single leg (performed bilaterally) x 15 56 lbs   Blue band double walk out anti rotation ab activation 5 sec hold x 10 bilateral  Tband blue rows 2 x 15  Tband blue gh ext 2 x 15     11/17/2021 TherEx:  Nustep level 5 for 7 minutes BLEs only  SLR x 10 bil. with additional cueing for technique  Squat to chair height with slow eccentric x 6 with cueing for neutral spine and education on progression/regression at home without weighted bar at gym  Prone alternating arm and leg lifts x 4 bil.   HEP updated, printed. PT educated on pain management with balance of rest, heat, and exercise, discussion of machine options at gym with safe and appropriate spine and LE loading. Pt verbalized understanding    11/10/2021 Therex:    HEP instruction/performance c  cues for techniques, handout provided.  Trial set performed of each for comprehension and symptom assessment.  See below for exercise list       PATIENT EDUCATION:  11/17/2021 Education details: HEP update, general gym based education on techniques.  Person educated: Patient Education method: Explanation, Demonstration, Verbal cues, and Handouts Education comprehension: verbalized understanding, returned demonstration, and verbal cues required       HOME EXERCISE PROGRAM: Access Code: [email or web address] URL: [email or web address] Date: 11/17/2021 Prepared by: Scot Jun   Exercises - Standing Lumbar Extension with Counter  - 3-5 x daily - 7 x weekly - 1 sets - 5-10 reps - Supine Lower Trunk Rotation  - 2-3 x daily - 7 x weekly - 1 sets - 3-5 reps - 15 hold - Supine Bridge  - 1-2 x daily - 7 x weekly - 1-2 sets - 10 reps - 2 hold - Seated Straight Leg Heel Taps  - 1-2 x daily - 7 x weekly - 3 sets - 10 reps - Sit to Stand  - 3 x daily - 7 x weekly - 1 sets - 10 reps - Prone Alternating Arm and Leg Lifts  - 1-2 x daily - 7 x weekly - 1-2 sets - 10 reps - 3 hold   ASSESSMENT:  CLINICAL IMPRESSION: Continued cues for adjustment of self exercise plan to avoid exacerbation of muscle spasms and to alter machine use to avoid strain on lower back (recumbent bike recommended).   Continued benefit from progressive core activation and LE strengthening to improve functional movement capacity.    OBJECTIVE IMPAIRMENTS decreased activity tolerance, decreased coordination, decreased mobility, difficulty walking, decreased ROM, decreased strength, hypomobility, increased fascial restrictions, impaired perceived functional ability, increased muscle spasms, impaired flexibility, improper body mechanics, postural dysfunction, and pain.    ACTIVITY LIMITATIONS carrying, lifting, bending, sitting, standing, squatting, sleeping, transfers, bed mobility, and locomotion level   PARTICIPATION  LIMITATIONS: cleaning, laundry, interpersonal relationship, community activity, and yard work   PERSONAL FACTORS Time since onset of injury/illness/exacerbation and DDD cervical, history of lumbar fusion, bilateral hand complaints concurrent  are also affecting patient's functional outcome.    REHAB POTENTIAL: Good   CLINICAL DECISION MAKING: Stable/uncomplicated   EVALUATION COMPLEXITY: Low     GOALS: Goals reviewed with patient? Yes   Short term PT Goals (target date for Short term goals are 3 weeks 12/01/2021) Patient will demonstrate independent use of home exercise program to maintain progress from in clinic treatments. Goal status: on going - assessed 11/17/2021   Long term PT goals (target dates for all long term goals are 10 weeks  01/19/2022 )   1. Patient will demonstrate/report pain at worst less than or equal to 2/10 to facilitate minimal limitation in daily activity secondary to pain symptoms. Goal status: on going- assessed 11/22/2021   2. Patient will demonstrate independent use of home exercise program to facilitate ability to maintain/progress functional gains from skilled physical therapy services. Goal status: on going- assessed 11/22/2021   3. Patient will demonstrate FOTO outcome > or = 55 % to indicate reduced disability due to condition. Goal status: on going- assessed 11/22/2021   4. Patient will demonstrate lumbar extension 100 % WFL s symptoms to facilitate upright standing, walking posture at PLOF s limitation. Goal status: on going- assessed 11/22/2021   5.  Patient will demonstrate bilateral LE MMT 5/5 throughout to facilitate usual daily house and yard work activity at [employer].    Goal status: on going- assessed 11/22/2021   6.  Patient will demonstrate/report ability to perform sitting/standing/walking > 30 mins s limitation due to symptoms.   Goal status: on going- assessed 11/22/2021       PLAN: PT FREQUENCY: 1-2x/week   PT DURATION: 10 weeks    PLANNED INTERVENTIONS: Therapeutic exercises, Therapeutic activity, Neuro Muscular re-education, Balance training, Gait training, Patient/Family education, Joint mobilization, Stair training, DME instructions, Dry Needling, Electrical stimulation, Cryotherapy, Moist heat, Taping, Traction Ultrasound, Ionotophoresis '4mg'$ /ml Dexamethasone, and Manual therapy.  All included unless contraindicated     PLAN FOR NEXT SESSION: Core stabilizations (possible planks on knees for longer holds), LE strengthening.    Scot Jun, PT, DPT, OCS, ATC 11/22/21  8:46 AM

## 2021-11-24 ENCOUNTER — Ambulatory Visit (INDEPENDENT_AMBULATORY_CARE_PROVIDER_SITE_OTHER): Payer: BC Managed Care – PPO | Admitting: Rehabilitative and Restorative Service Providers"

## 2021-11-24 ENCOUNTER — Encounter: Payer: Self-pay | Admitting: Rehabilitative and Restorative Service Providers"

## 2021-11-24 DIAGNOSIS — R293 Abnormal posture: Secondary | ICD-10-CM | POA: Diagnosis not present

## 2021-11-24 DIAGNOSIS — M6281 Muscle weakness (generalized): Secondary | ICD-10-CM

## 2021-11-24 DIAGNOSIS — R262 Difficulty in walking, not elsewhere classified: Secondary | ICD-10-CM | POA: Diagnosis not present

## 2021-11-24 DIAGNOSIS — M5459 Other low back pain: Secondary | ICD-10-CM | POA: Diagnosis not present

## 2021-11-24 NOTE — Therapy (Signed)
OUTPATIENT PHYSICAL THERAPY TREATMENT NOTE   Patient Name: Dwayne Acosta. MRN: [id number] DOB:[birth year], 66 y.o., male Today's Date: 11/24/2021  PCP: Sharilyn Sites. MD REFERRING PROVIDER: Jessy Oto, MD  END OF SESSION:   PT End of Session - 11/24/21 0900     Visit Number 4    Number of Visits 20    Date for PT Re-Evaluation 01/19/22    Authorization Type Medicare /BCBS    Progress Note Due on Visit 10    PT Start Time 0851    PT Stop Time 0929    PT Time Calculation (min) 38 min    Activity Tolerance Patient tolerated treatment well    Behavior During Therapy [facility] for tasks assessed/performed               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 10/23/2012   Overactive bladder    Spinal stenosis of lumbar region with neurogenic claudication 12/05/2016   Spondylosis without myelopathy or radiculopathy, cervical region 11/21/2016   Past Surgical History:  Procedure Laterality Date   APPENDECTOMY     BACK SURGERY  2005   FOOT SURGERY Left    bunion   LAPAROSCOPIC APPENDECTOMY N/A 07/20/2016   Procedure: APPENDECTOMY LAPAROSCOPIC;  Surgeon: Armandina Gemma, MD;  Location: [facility];  Service: General;  Laterality: N/A;   LUMBAR FUSION  12/05/2016   ROTATOR CUFF REPAIR Bilateral    Patient Active Problem List   Diagnosis Date Noted   Spinal stenosis of lumbar region 12/05/2016    Class: Chronic   Spinal stenosis of lumbar region with neurogenic claudication 12/05/2016   Degenerative disc disease, cervical 11/21/2016   Spondylosis without myelopathy or radiculopathy, cervical region 11/21/2016   Acute appendicitis 07/20/2016   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   Bladder neck obstruction  02/26/2015   Increased frequency of urination 02/26/2015   Urinary urgency 02/26/2015   Conjunctivitis, chemical 04/16/2013   Dry eye syndrome 12/12/2012   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 12/12/2012   NS (nuclear sclerosis) 10/23/2012   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  Pt indicated doing the elliptical and reduced resistance on activity which helped leg symptoms.  Pt indicated some irritation in back after elliptical but recovery was quicker.    PERTINENT HISTORY:  DDD cervical, Spinal stenosis history of fusion L4-S1   PAIN:  NPRS scale: 0/10 upon arrival.  Pain location: back Pain description: stiffness/tightness, spasms in leg Aggravating factors: after exercise Relieving factors: OTC, movement, ice      PRECAUTIONS: Concurrent complaints of hand pain  (has referral for OT)   WEIGHT BEARING RESTRICTIONS No   FALLS:  Has patient fallen in last 6 months? No   LIVING ENVIRONMENT: Lives in: House/apartment Stairs: multilevel house, bedroom on first floor. A couple stairs to enter.      OCCUPATION: Retired    PLOF:  Independent, gym workouts, yardwork    PATIENT GOALS What to do about pain to make it better     OBJECTIVE:    DIAGNOSTIC FINDINGS:  11/10/2021   Imaging review: Findings:  AP and lateral flexion and extension radiographs of the lumbar spine shows  fusion L4 to S1 with cages and both  L4-5 and L5-S1 pedicle screws and rods at L4-5. There is moderately severe  degenerative disc changes at the L3-4 and mild changes at L2-3 with  anterior spurs at both of these levels and significant disc narrowing with  end plate scleroosis at W4-1. Grade 1 retrolisthesis and lateral listhesis  to the right side at L3-4. Fusions at L4-5 and L5-S1 appear solid.    PATIENT SURVEYS:  11/10/2021 FOTO intake: 51   predicted:  55   SCREENING FOR RED FLAGS: 11/10/2021 Bowel or bladder incontinence: No Cauda equina syndrome: No   COGNITION: 11/10/2021            Overall cognitive status: WFL normal              SENSATION: 11/10/2021 [facility]   MUSCLE LENGTH: 11/10/2021 No specific checks today   POSTURE:  11/10/2021 Reduced lumbar lordosis in standing   PALPATION: 11/10/2021 Mild tenderness lumbar paraspinals bilaterally.    LUMBAR ROM:    Active  AROM  11/10/2021  Flexion    Extension 50 % c ERP lumbar,    Repeated x 5 in standing, improved to 75 % c reduced back pain  Right lateral flexion To knee joint c pain  Left lateral flexion To knee joint c tightness  Right rotation    Left rotation     (Blank rows = not tested)   LOWER EXTREMITY ROM:        Right 11/10/2021 Left 11/10/2021  Hip flexion      Hip extension      Hip abduction      Hip adduction      Hip internal rotation      Hip external rotation      Knee flexion      Knee extension      Ankle dorsiflexion      Ankle plantarflexion      Ankle inversion      Ankle eversion       (Blank rows = not tested)   LOWER EXTREMITY MMT:     MMT Right 11/10/2021 Left 11/10/2021 Right 11/22/2021 Left 11/22/2021  Hip flexion 5/5 5/5    Hip  extension        Hip abduction        Hip adduction  Hip internal rotation        Hip external rotation        Knee flexion 5/5 5/5    Knee extension 4/5 4/5 4/5 5/5  Ankle dorsiflexion 5/5 5/5    Ankle plantarflexion        Ankle inversion        Ankle eversion         (Blank rows = not tested)   LUMBAR SPECIAL TESTS:  11/10/2021 (-) slump bilateral   FUNCTIONAL TESTS:  11/17/2021:  Functional squat over chair 18 inch height:  Movement good until halfway, then increased lumbar flexion and decreased control.   11/10/2021 18 inch transfer s UE assist:  able to perform, difficulty noted   GAIT: 11/10/2021 Independent ambulation in clinic.       TODAY'S TREATMENT  11/24/2021 TherEx:  UBE LE only mostly lvl 4.5 seat 11 10 mins RPM 50-60   Leg extension machine double leg up, eccentric lowering 15 lbs  x 10 bilateral  Blue band double walk out anti rotation ab activation 5 sec hold x 10 bilateral  Seated pball blue band rows with 10 reps with one leg off floor (performed bilateral)  Seated pball blue band gh ext x 20 c feet on floor  Prone plank on knees 45 sec x 3  Prone opposite arm/leg lift 3 sec hold x 10 bilateral    11/22/2021 TherEx:  Recumbent bike on UBE lvl 5.0 10 mins 50-60 rpm  (cues given for adjustment on exercise on bike at home to reduced resistance amount for improvement of leg symptoms and changing to recumbent bike instead of upright bike for back complaints - additional time spent in education    Leg press Double leg 125 lbs 2 x 15, single leg (performed bilaterally) x 15 56 lbs   Blue band double walk out anti rotation ab activation 5 sec hold x 10 bilateral  Tband blue rows 2 x 15  Tband blue gh ext 2 x 15    11/17/2021 TherEx:  Nustep level 5 for 7 minutes BLEs only  SLR x 10 bil. with additional cueing for technique  Squat to chair height with slow eccentric x 6 with cueing for neutral spine and education on progression/regression at home without  weighted bar at gym  Prone alternating arm and leg lifts x 4 bil.   HEP updated, printed. PT educated on pain management with balance of rest, heat, and exercise, discussion of machine options at gym with safe and appropriate spine and LE loading. Pt verbalized understanding     PATIENT EDUCATION:  11/17/2021 Education details: HEP update, general gym based education on techniques.  Person educated: Patient Education method: Explanation, Demonstration, Verbal cues, and Handouts Education comprehension: verbalized understanding, returned demonstration, and verbal cues required       HOME EXERCISE PROGRAM: Access Code: [email or web address] URL: [email or web address] Date: 11/17/2021 Prepared by: Scot Jun   Exercises - Standing Lumbar Extension with Counter  - 3-5 x daily - 7 x weekly - 1 sets - 5-10 reps - Supine Lower Trunk Rotation  - 2-3 x daily - 7 x weekly - 1 sets - 3-5 reps - 15 hold - Supine Bridge  - 1-2 x daily - 7 x weekly - 1-2 sets - 10 reps - 2 hold - Seated Straight Leg Heel Taps  - 1-2 x daily - 7 x weekly - 3 sets - 10 reps - Sit to Stand  - 3 x daily - 7 x  weekly - 1 sets - 10 reps - Prone Alternating Arm and Leg Lifts  - 1-2 x daily - 7 x weekly - 1-2 sets - 10 reps - 3 hold   ASSESSMENT:   CLINICAL IMPRESSION: Continued review of cues for home exercise routine for focusing on reduced resistance in activity to avoid overworking in aerobic exercise/weight training.   Core and general stability in dynamic movement lacking at this time.  Continued skilled PT services and HEP to help improve.    OBJECTIVE IMPAIRMENTS decreased activity tolerance, decreased coordination, decreased mobility, difficulty walking, decreased ROM, decreased strength, hypomobility, increased fascial restrictions, impaired perceived functional ability, increased muscle spasms, impaired flexibility, improper body mechanics, postural dysfunction, and pain.    ACTIVITY LIMITATIONS  carrying, lifting, bending, sitting, standing, squatting, sleeping, transfers, bed mobility, and locomotion level   PARTICIPATION LIMITATIONS: cleaning, laundry, interpersonal relationship, community activity, and yard work   PERSONAL FACTORS Time since onset of injury/illness/exacerbation and DDD cervical, history of lumbar fusion, bilateral hand complaints concurrent  are also affecting patient's functional outcome.    REHAB POTENTIAL: Good   CLINICAL DECISION MAKING: Stable/uncomplicated   EVALUATION COMPLEXITY: Low     GOALS: Goals reviewed with patient? Yes   Short term PT Goals (target date for Short term goals are 3 weeks 12/01/2021) Patient will demonstrate independent use of home exercise program to maintain progress from in clinic treatments. Goal status: on going - assessed 11/17/2021   Long term PT goals (target dates for all long term goals are 10 weeks  01/19/2022 )   1. Patient will demonstrate/report pain at worst less than or equal to 2/10 to facilitate minimal limitation in daily activity secondary to pain symptoms. Goal status: on going- assessed 11/22/2021   2. Patient will demonstrate independent use of home exercise program to facilitate ability to maintain/progress functional gains from skilled physical therapy services. Goal status: on going- assessed 11/22/2021   3. Patient will demonstrate FOTO outcome > or = 55 % to indicate reduced disability due to condition. Goal status: on going- assessed 11/22/2021   4. Patient will demonstrate lumbar extension 100 % WFL s symptoms to facilitate upright standing, walking posture at PLOF s limitation. Goal status: on going- assessed 11/22/2021   5.  Patient will demonstrate bilateral LE MMT 5/5 throughout to facilitate usual daily house and yard work activity at [employer].    Goal status: on going- assessed 11/22/2021   6.  Patient will demonstrate/report ability to perform sitting/standing/walking > 30 mins s limitation due to  symptoms.   Goal status: on going- assessed 11/22/2021       PLAN: PT FREQUENCY: 1-2x/week   PT DURATION: 10 weeks   PLANNED INTERVENTIONS: Therapeutic exercises, Therapeutic activity, Neuro Muscular re-education, Balance training, Gait training, Patient/Family education, Joint mobilization, Stair training, DME instructions, Dry Needling, Electrical stimulation, Cryotherapy, Moist heat, Taping, Traction Ultrasound, Ionotophoresis '4mg'$ /ml Dexamethasone, and Manual therapy.  All included unless contraindicated     PLAN FOR NEXT SESSION: Continued improvement in in LE strength/endurance, progressive stability interventions in dynamic activity.    Scot Jun, PT, DPT, OCS, ATC 11/24/21  9:26 AM

## 2021-11-28 ENCOUNTER — Ambulatory Visit (INDEPENDENT_AMBULATORY_CARE_PROVIDER_SITE_OTHER): Payer: BC Managed Care – PPO | Admitting: Rehabilitative and Restorative Service Providers"

## 2021-11-28 ENCOUNTER — Encounter: Payer: Self-pay | Admitting: Rehabilitative and Restorative Service Providers"

## 2021-11-28 DIAGNOSIS — M6281 Muscle weakness (generalized): Secondary | ICD-10-CM | POA: Diagnosis not present

## 2021-11-28 DIAGNOSIS — R262 Difficulty in walking, not elsewhere classified: Secondary | ICD-10-CM

## 2021-11-28 DIAGNOSIS — M25542 Pain in joints of left hand: Secondary | ICD-10-CM | POA: Diagnosis not present

## 2021-11-28 DIAGNOSIS — R278 Other lack of coordination: Secondary | ICD-10-CM

## 2021-11-28 DIAGNOSIS — M25541 Pain in joints of right hand: Secondary | ICD-10-CM

## 2021-11-28 DIAGNOSIS — R293 Abnormal posture: Secondary | ICD-10-CM | POA: Diagnosis not present

## 2021-11-28 DIAGNOSIS — M5459 Other low back pain: Secondary | ICD-10-CM

## 2021-11-28 NOTE — Therapy (Signed)
OUTPATIENT PHYSICAL THERAPY TREATMENT NOTE   Patient Name: Dwayne Acosta. MRN: [id number] DOB:1956-03-04, 66 y.o., male Today's Date: 11/28/2021  PCP: Sharilyn Sites. MD REFERRING PROVIDER: Jessy Oto, MD  END OF SESSION:   PT End of Session - 11/28/21 0853     Visit Number 5    Number of Visits 20    Date for PT Re-Evaluation 01/19/22    Authorization Type Medicare /BCBS    Progress Note Due on Visit 10    PT Start Time 0842    PT Stop Time 0922    PT Time Calculation (min) 40 min    Activity Tolerance Patient tolerated treatment well    Behavior During Therapy [facility] for tasks assessed/performed                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 10/23/2012   Overactive bladder    Spinal stenosis of lumbar region with neurogenic claudication 12/05/2016   Spondylosis without myelopathy or radiculopathy, cervical region 11/21/2016   Past Surgical History:  Procedure Laterality Date   APPENDECTOMY     BACK SURGERY  2005   FOOT SURGERY Left    bunion   LAPAROSCOPIC APPENDECTOMY N/A 07/20/2016   Procedure: APPENDECTOMY LAPAROSCOPIC;  Surgeon: Armandina Gemma, MD;  Location: [facility];  Service: General;  Laterality: N/A;   LUMBAR FUSION  12/05/2016   ROTATOR CUFF REPAIR Bilateral    Patient Active Problem List   Diagnosis Date Noted   Spinal stenosis of lumbar region 12/05/2016    Class: Chronic   Spinal stenosis of lumbar region with neurogenic claudication 12/05/2016   Degenerative disc disease, cervical 11/21/2016   Spondylosis without myelopathy or radiculopathy, cervical region 11/21/2016   Acute appendicitis 07/20/2016   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   Bladder neck obstruction  02/26/2015   Increased frequency of urination 02/26/2015   Urinary urgency 02/26/2015   Conjunctivitis, chemical 04/16/2013   Dry eye syndrome 12/12/2012   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 12/12/2012   NS (nuclear sclerosis) 10/23/2012   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 10/23/2012    REFERRING DIAG: Z79.1 (ICD-10-CM) - NSAID long-term use M53.3,G89.29 (ICD-10-CM) - Chronic right SI joint pain M47.816 (ICD-10-CM) - Spondylosis without myelopathy or radiculopathy, lumbar region M19.041,M19.042 (ICD-10-CM) - Localized osteoarthritis of hands, bilateral  THERAPY DIAG:  Other low back pain  Abnormal posture  Muscle weakness (generalized)  Difficulty in walking, not elsewhere classified  Rationale for Evaluation and Treatment Rehabilitation  ONSET DATE: 3-4 years since surgery   SUBJECTIVE:  SUBJECTIVE STATEMENT: Pt reported feeling back spasms off and on, noticed c bending.  Pt indicated he feels like he is building muscle or nerves are being reactivated in back.  Pt. Indicated having less spasms in Rt leg with resistance changes.  Reported feeling back spasms about 3-4 per day.  Pt indicated it may be related to body Deal[employer]   PERTINENT HISTORY:  DDD cervical, Spinal stenosis history of fusion L4-S1   PAIN:  NPRS scale: 0/10 upon arrival.  Pain location: back Pain description: stiffness/tightness, spasms in leg Aggravating factors: after exercise Relieving factors: OTC, movement, ice     PRECAUTIONS: Concurrent complaints of hand pain  (has referral for OT)   WEIGHT BEARING RESTRICTIONS No   FALLS:  Has patient fallen in last 6 months? No   LIVING ENVIRONMENT: Lives in: House/apartment Stairs: multilevel house,  bedroom on first floor. A couple stairs to enter.      OCCUPATION: Retired    PLOF: Independent, gym workouts, yardwork    PATIENT GOALS What to do about pain to make it better     OBJECTIVE:    DIAGNOSTIC FINDINGS:  11/10/2021  Imaging review: Findings:  AP and lateral flexion and extension radiographs of the lumbar spine shows  fusion L4 to S1 with cages and both  L4-5 and L5-S1 pedicle screws and rods at L4-5. There is moderately severe  degenerative disc changes at the L3-4 and mild changes at L2-3 with  anterior spurs at both of these levels and significant disc narrowing with  end plate scleroosis at X5-4. Grade 1 retrolisthesis and lateral listhesis  to the right side at L3-4. Fusions at L4-5 and L5-S1 appear solid.    PATIENT SURVEYS:  11/10/2021 FOTO intake: 51   predicted:  55   SCREENING FOR RED FLAGS: 11/10/2021 Bowel or bladder incontinence: No Cauda equina syndrome: No   COGNITION: 11/10/2021            Overall cognitive status: WFL normal              SENSATION: 11/10/2021 [facility]   MUSCLE LENGTH: 11/10/2021 No specific checks today   POSTURE:  11/10/2021 Reduced lumbar lordosis in standing   PALPATION: 11/10/2021 Mild tenderness lumbar paraspinals bilaterally.    LUMBAR ROM:    Active  AROM  11/10/2021 AROM 11/28/2021  Flexion     Extension 50 % c ERP lumbar,    Repeated x 5 in standing, improved to 75 % c reduced back pain 75% WFL  Right lateral flexion To knee joint c pain   Left lateral flexion To knee joint c tightness   Right rotation     Left rotation      (Blank rows = not tested)   LOWER EXTREMITY ROM:        Right 11/10/2021 Left 11/10/2021  Hip flexion      Hip extension      Hip abduction      Hip adduction      Hip internal rotation      Hip external rotation      Knee flexion      Knee extension      Ankle dorsiflexion      Ankle plantarflexion      Ankle inversion      Ankle eversion       (Blank rows = not tested)   LOWER  EXTREMITY MMT:     MMT Right 11/10/2021 Left 11/10/2021 Right 11/22/2021 Left 11/22/2021  Hip flexion 5/5 5/5  Hip extension        Hip abduction        Hip adduction        Hip internal rotation        Hip external rotation        Knee flexion 5/5 5/5    Knee extension 4/5 4/5 4/5 5/5  Ankle dorsiflexion 5/5 5/5    Ankle plantarflexion        Ankle inversion        Ankle eversion         (Blank rows = not tested)   LUMBAR SPECIAL TESTS:  11/10/2021 (-) slump bilateral   FUNCTIONAL TESTS:  11/17/2021:  Functional squat over chair 18 inch height:  Movement good until halfway, then increased lumbar flexion and decreased control.   11/10/2021 18 inch transfer s UE assist:  able to perform, difficulty noted   GAIT: 11/10/2021 Independent ambulation in clinic.       TODAY'S TREATMENT  11/28/2021 TherEx:  Recumbent bike lvl 5 10 mins 50-60 rpm  Blue band double walk out anti rotation ab activation 5 sec hold x 10 bilateral  Doorway hip hike 5 sec hold x 10 bilateral    Prone opposite arm/leg lift 3 sec hold x 10 bilateral  TherActivity  Leg press flat back double leg 125 lbs x 15, single leg 62 lbs x 15 performed bilaterally  Lateral step down eccentric control 2 x 15 6 inch bilaterally  11/24/2021 TherEx:  UBE LE only mostly lvl 4.5 seat 11 10 mins RPM 50-60   Leg extension machine double leg up, eccentric lowering 15 lbs  x 10 bilateral  Blue band double walk out anti rotation ab activation 5 sec hold x 10 bilateral  Seated pball blue band rows with 10 reps with one leg off floor (performed bilateral)  Seated pball blue band gh ext x 20 c feet on floor  Prone plank on knees 45 sec x 3  Prone opposite arm/leg lift 3 sec hold x 10 bilateral    11/22/2021 TherEx:  Recumbent bike on UBE lvl 5.0 10 mins 50-60 rpm  (cues given for adjustment on exercise on bike at home to reduced resistance amount for improvement of leg symptoms and changing to recumbent bike instead of upright  bike for back complaints - additional time spent in education    Leg press Double leg 125 lbs 2 x 15, single leg (performed bilaterally) x 15 56 lbs   Blue band double walk out anti rotation ab activation 5 sec hold x 10 bilateral  Tband blue rows 2 x 15  Tband blue gh ext 2 x 15      PATIENT EDUCATION:  11/28/2021 Education details: HEP update, general gym based education on techniques.  Person educated: Patient Education method: Explanation, Demonstration, Verbal cues, and Handouts Education comprehension: verbalized understanding, returned demonstration, and verbal cues required       HOME EXERCISE PROGRAM: Access Code: [email or web address] URL: [email or web address] Date: 11/28/2021 Prepared by: Scot Jun  Exercises - Standing Lumbar Extension with Counter  - 3-5 x daily - 7 x weekly - 1 sets - 5-10 reps - Supine Lower Trunk Rotation  - 2-3 x daily - 7 x weekly - 1 sets - 3-5 reps - 15 hold - Supine Bridge  - 1-2 x daily - 7 x weekly - 1-2 sets - 10 reps - 2 hold - Seated Straight Leg Heel Taps  - 1-2 x daily - 7 x weekly -  3 sets - 10 reps - Sit to Stand  - 3 x daily - 7 x weekly - 1 sets - 10 reps - Prone Alternating Arm and Leg Lifts  - 1-2 x daily - 7 x weekly - 1-2 sets - 10 reps - 3 hold - Standing Hip Hiking  - 1-2 x daily - 7 x weekly - 1 sets - 10-15 reps - 5 hold   ASSESSMENT:   CLINICAL IMPRESSION: Reviewed importance of use of HEP for stretching in times of spasms complaints.  Presentation of Rt leg weakness and general balance control deficits noted from Rt compared to Lt and can negatively impact functional movement pattern.    OBJECTIVE IMPAIRMENTS decreased activity tolerance, decreased coordination, decreased mobility, difficulty walking, decreased ROM, decreased strength, hypomobility, increased fascial restrictions, impaired perceived functional ability, increased muscle spasms, impaired flexibility, improper body mechanics, postural dysfunction, and  pain.    ACTIVITY LIMITATIONS carrying, lifting, bending, sitting, standing, squatting, sleeping, transfers, bed mobility, and locomotion level   PARTICIPATION LIMITATIONS: cleaning, laundry, interpersonal relationship, community activity, and yard work   PERSONAL FACTORS Time since onset of injury/illness/exacerbation and DDD cervical, history of lumbar fusion, bilateral hand complaints concurrent  are also affecting patient's functional outcome.    REHAB POTENTIAL: Good   CLINICAL DECISION MAKING: Stable/uncomplicated   EVALUATION COMPLEXITY: Low     GOALS: Goals reviewed with patient? Yes   Short term PT Goals (target date for Short term goals are 3 weeks 12/01/2021) Patient will demonstrate independent use of home exercise program to maintain progress from in clinic treatments. Goal status: on going - assessed 11/17/2021   Long term PT goals (target dates for all long term goals are 10 weeks  01/19/2022 )   1. Patient will demonstrate/report pain at worst less than or equal to 2/10 to facilitate minimal limitation in daily activity secondary to pain symptoms. Goal status: on going- assessed 11/22/2021   2. Patient will demonstrate independent use of home exercise program to facilitate ability to maintain/progress functional gains from skilled physical therapy services. Goal status: on going- assessed 11/22/2021   3. Patient will demonstrate FOTO outcome > or = 55 % to indicate reduced disability due to condition. Goal status: on going- assessed 11/22/2021   4. Patient will demonstrate lumbar extension 100 % WFL s symptoms to facilitate upright standing, walking posture at PLOF s limitation. Goal status: on going- assessed 11/22/2021   5.  Patient will demonstrate bilateral LE MMT 5/5 throughout to facilitate usual daily house and yard work activity at [employer].    Goal status: on going- assessed 11/22/2021   6.  Patient will demonstrate/report ability to perform  sitting/standing/walking > 30 mins s limitation due to symptoms.   Goal status: on going- assessed 11/22/2021       PLAN: PT FREQUENCY: 1-2x/week   PT DURATION: 10 weeks   PLANNED INTERVENTIONS: Therapeutic exercises, Therapeutic activity, Neuro Muscular re-education, Balance training, Gait training, Patient/Family education, Joint mobilization, Stair training, DME instructions, Dry Needling, Electrical stimulation, Cryotherapy, Moist heat, Taping, Traction Ultrasound, Ionotophoresis '4mg'$ /ml Dexamethasone, and Manual therapy.  All included unless contraindicated     PLAN FOR NEXT SESSION: Continue review and stress importance of HEP based relief techniques for symptoms when they occur.  Progressive stability/ strengthening.    Scot Jun, PT, DPT, OCS, ATC 11/28/21  9:21 AM

## 2021-11-28 NOTE — Therapy (Signed)
OUTPATIENT OCCUPATIONAL THERAPY TREATMENT & DISCHARGE NOTE  Patient Name: Dwayne Acosta. MRN: [id number] DOB:[birth year], 66 y.o., male Today's Date: 11/28/2021  PCP: Sharilyn Sites MD REFERRING PROVIDER: Jessy Oto, MD   OT End of Session - 11/28/21 [phone]     Visit Number 3    Number of Visits 6    Date for OT Re-Evaluation 12/16/21    OT Start Time 0807    OT Stop Time 0842    OT Time Calculation (min) 35 min    Activity Tolerance Patient tolerated treatment well;No increased pain;Patient limited by fatigue;Patient limited by pain    Behavior During Therapy [facility] for tasks assessed/performed               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 10/23/2012   Overactive bladder    Spinal stenosis of lumbar region with neurogenic claudication 12/05/2016   Spondylosis without myelopathy or radiculopathy, cervical region 11/21/2016   Past Surgical History:  Procedure Laterality Date   APPENDECTOMY     BACK SURGERY  2005   FOOT SURGERY Left    bunion   LAPAROSCOPIC APPENDECTOMY N/A 07/20/2016   Procedure: APPENDECTOMY LAPAROSCOPIC;  Surgeon: Armandina Gemma, MD;  Location: [facility];  Service: General;  Laterality: N/A;   LUMBAR FUSION  12/05/2016   ROTATOR CUFF REPAIR Bilateral    Patient Active Problem List   Diagnosis Date Noted   Spinal stenosis of lumbar region 12/05/2016    Class: Chronic   Spinal stenosis of lumbar region with neurogenic claudication 12/05/2016   Degenerative disc disease, cervical 11/21/2016   Spondylosis without myelopathy or radiculopathy, cervical region 11/21/2016   Acute appendicitis 07/20/2016   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   Bladder neck obstruction 02/26/2015    Increased frequency of urination 02/26/2015   Urinary urgency 02/26/2015   Conjunctivitis, chemical 04/16/2013   Dry eye syndrome 12/12/2012   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 12/12/2012   NS (nuclear sclerosis) 10/23/2012   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 Self-care careful "workouts" / health management has been better, he is more aware of the issue and is able to avoid certain things now.    PAIN:  Are you having pain? Yes  Rating: 2/10 at rest now in Right thumb CMC J but now Lt thumb not painful; at worst 2-3/10 at worst in past week    OBJECTIVE:   HAND DOMINANCE: Right   ADLs: Overall ADLs: States decreased ability to grab, hold household objects, pain and inability to open containers,  perform all FMS tasks, etc.    FUNCTIONAL OUTCOME MEASURES: 11/28/21: PSFS 7  Eval: Patient Specific Functional Scale: 5.2 (eye drop vial, push toothbrush button, weight lifting routines)  UPPER EXTREMITY ROM     Active ROM Right eval Left eval Right 11/28/21  Wrist flexion 80 74 76  Wrist extension 75 70 73  (Blank rows = not tested)  Active ROM Right eval Left eval Right 11/28/21  Thumb MCP (0-60) 57 49 59  Thumb IP (0-80) 72 78 79  Thumb Radial abd/add (0-55)      Thumb Palmar abd/add (0-45) 57  57    Thumb Opposition to Small Finger    full  Index MCP (0-90)      Index PIP (0-100)      Index DIP (0-70)       (Blank rows = not tested)   UPPER EXTREMITY MMT:    11/28/21: full 5/5 strength in wrists and proximal now   Eval: at least 4-/5 in b/l UEs with pain in thumbs with various weight bearing positions.    HAND FUNCTION: 11/28/21: 87.7#  Right, no pain today   Eval: Grip strength Right: 104 lbs, Left: 92 lbs     TODAY'S TREATMENT:  11/28/21: He starts with AROM for measures and a discussion of goals, pain, self-care and home routines.  He explains that everything is "better" and even "forgot" that he had issues using his electric toothbrush before.  His motion and strength measures a bit lower today, but non-painful, and that is typical for someone who has been more mindful of preventing overuse compared to previously. He states better coordination and easier able to open webspace now.  OT reviews HEP with him, watching him perform each exercise and activity. He states no more concerns and that he understands HEP and has confidence that he can manage symptoms now.   Exercises: - Seated Wrist Flexion Stretch  - 2-3 x daily - 3 reps - 15 hold - Wrist Extension Stretch Pronated  - 2-3 x daily - 3 reps - 15 hold - Stretch thumb downward   - 2-3 x daily - 3-5 reps - 15 sec hold - Stretch Thumb into "C-Shape" (don't use other thumb)  - 2-3 x daily - 3-5 reps - 15 sec hold Activities:  - Towel Roll Grip with Forearm in Neutral  - 2-3 x daily - 3-5 reps - 10 sec hold - Resisted Finger Abduction - Index and Middle  - 2-3 x daily - 5-10 reps - 2-3 sec hold - C-Strength (try using rubber band)   - 2-3 x daily - 5-10 reps - 2-3 sec hold - Thumb Opposition  - 2-3 x daily - 10 reps   PATIENT EDUCATION: Education details: See tx section above for details  Person educated: Patient Education method: Verbal Instruction, Teach back, Handouts  Education comprehension: States and demonstrates  understanding, Additional Education required    HOME EXERCISE PROGRAM: Access Code: [email or web address] URL: [email or web address]   GOALS: Goals reviewed with patient? Yes   SHORT TERM GOALS: (STG required if POC>30 days)  Pt will obtain protective, custom orthotic. Target date: 11/26/21 Goal status: MET  2.  Pt will demo/state understanding of initial HEP to improve pain levels and prerequisite motion. Target date: 11/18/21 Goal status: 11/28/21: MET   LONG TERM GOALS:  Pt will improve functional ability by decreased impairment per PSFS assessment from 5.2 to 8 or better, for better quality of life. Target date: 12/16/21 Goal status: 11/28/21: Partially  Met 7  2.   Pt will decrease pain at worst from 6-7/10 to 2-3/10 or better to have better sleep and occupational participation in daily roles. Target date: 12/16/21 Goal status: 11/28/21: MET    ASSESSMENT:  CLINICAL IMPRESSION: 11/28/21: He has met all goals to satisfaction,  has no new goals for OT and is pleased with progress. Meets D/C now    PLAN: OT FREQUENCY: D/C now   OT DURATION: D/C now  PLANNED INTERVENTIONS: self care/ADL training, therapeutic exercise, therapeutic activity, manual therapy, passive range of motion, splinting, ultrasound, compression bandaging, moist heat, cryotherapy, contrast bath, patient/family education, and coping strategies training  RECOMMENDED OTHER SERVICES: He will be receiving PT for back pain and problems beginning today as well.   CONSULTED AND AGREED WITH PLAN OF CARE: Patient  PLAN FOR NEXT SESSION:  D/C    Benito Mccreedy, OTR/L, CHT 11/28/2021, 10:02 AM   OCCUPATIONAL THERAPY DISCHARGE SUMMARY  Visits from Start of Care: 3  Current functional level related to goals / functional outcomes: Pt has met all goals to satisfactory levels and is pleased with outcomes.   Remaining deficits: Pt has no more significant functional deficits or pain.   Education /  Equipment: Pt has all needed materials and education. Pt understands how to continue on with self-management. See tx notes for more details.   Patient agrees to discharge due to max benefits received from outpatient occupational therapy / hand therapy at this time.   Benito Mccreedy, OTR/L, CHT 11/28/21

## 2021-12-01 ENCOUNTER — Ambulatory Visit (INDEPENDENT_AMBULATORY_CARE_PROVIDER_SITE_OTHER): Payer: BC Managed Care – PPO | Admitting: Rehabilitative and Restorative Service Providers"

## 2021-12-01 ENCOUNTER — Encounter: Payer: Self-pay | Admitting: Rehabilitative and Restorative Service Providers"

## 2021-12-01 DIAGNOSIS — R262 Difficulty in walking, not elsewhere classified: Secondary | ICD-10-CM | POA: Diagnosis not present

## 2021-12-01 DIAGNOSIS — R293 Abnormal posture: Secondary | ICD-10-CM | POA: Diagnosis not present

## 2021-12-01 DIAGNOSIS — M6281 Muscle weakness (generalized): Secondary | ICD-10-CM

## 2021-12-01 DIAGNOSIS — M5459 Other low back pain: Secondary | ICD-10-CM | POA: Diagnosis not present

## 2021-12-01 NOTE — Therapy (Signed)
OUTPATIENT PHYSICAL THERAPY TREATMENT NOTE   Patient Name: Dwayne Acosta. MRN: [id number] DOB:May 26, 1955, 66 y.o., male Today's Date: 12/01/2021  PCP: Sharilyn Sites. 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 10/23/2012   Overactive bladder    Spinal stenosis of lumbar region with neurogenic claudication 12/05/2016   Spondylosis without myelopathy or radiculopathy, cervical region 11/21/2016   Past Surgical History:  Procedure Laterality Date   APPENDECTOMY     BACK SURGERY  2005   FOOT SURGERY Left    bunion   LAPAROSCOPIC APPENDECTOMY N/A 07/20/2016   Procedure: APPENDECTOMY LAPAROSCOPIC;  Surgeon: Armandina Gemma, MD;  Location: [facility];  Service: General;  Laterality: N/A;   LUMBAR FUSION  12/05/2016   ROTATOR CUFF REPAIR Bilateral    Patient Active Problem List   Diagnosis Date Noted   Spinal stenosis of lumbar region 12/05/2016    Class: Chronic   Spinal stenosis of lumbar region with neurogenic claudication 12/05/2016   Degenerative disc disease, cervical 11/21/2016   Spondylosis without myelopathy or radiculopathy, cervical region 11/21/2016   Acute appendicitis 07/20/2016   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   Bladder neck obstruction  02/26/2015   Increased frequency of urination 02/26/2015   Urinary urgency 02/26/2015   Conjunctivitis, chemical 04/16/2013   Dry eye syndrome 12/12/2012   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 12/12/2012   NS (nuclear sclerosis) 10/23/2012   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  Pt indicated he has continued to make some adjustments as described in clinic visits.  Pt indicated having some soreness here and there after workouts (back and Rt leg)   PERTINENT HISTORY:  DDD cervical, Spinal stenosis history of fusion L4-S1   PAIN:  NPRS scale: 0/10 upon arrival.  Pain location: back Pain description: stiffness/tightness, spasms in leg Aggravating factors: after exercise Relieving factors: OTC, movement, ice     PRECAUTIONS: Concurrent complaints of hand pain  (has referral for OT)   WEIGHT BEARING RESTRICTIONS No   FALLS:  Has patient fallen in last 6 months? No   LIVING ENVIRONMENT: Lives in: House/apartment Stairs: multilevel house, bedroom on first floor. A couple stairs to enter.      OCCUPATION: Retired    PLOF: Independent, gym  workouts, yardwork    PATIENT GOALS What to do about pain to make it better     OBJECTIVE:    DIAGNOSTIC FINDINGS:  11/10/2021  Imaging review: Findings:  AP and lateral flexion and extension radiographs of the lumbar spine shows  fusion L4 to S1 with cages and both  L4-5 and L5-S1 pedicle screws and rods at L4-5. There is moderately severe  degenerative disc changes at the L3-4 and mild changes at L2-3 with  anterior spurs at both of these levels and significant disc narrowing with  end plate scleroosis at M5-7. Grade 1 retrolisthesis and lateral listhesis  to the right side at L3-4. Fusions at L4-5 and L5-S1 appear solid.    PATIENT SURVEYS:  11/10/2021 FOTO intake: 51   predicted:  55   SCREENING FOR RED FLAGS: 11/10/2021 Bowel or bladder incontinence: No Cauda equina syndrome: No   COGNITION: 11/10/2021            Overall cognitive status: WFL normal              SENSATION: 11/10/2021 [facility]   MUSCLE LENGTH: 11/10/2021 No specific checks today   POSTURE:  11/10/2021 Reduced lumbar lordosis in standing   PALPATION: 11/10/2021 Mild tenderness lumbar paraspinals bilaterally.    LUMBAR ROM:    Active  AROM  11/10/2021 AROM 11/28/2021  Flexion     Extension 50 % c ERP lumbar,    Repeated x 5 in standing, improved to 75 % c reduced back pain 75% WFL  Right lateral flexion To knee joint c pain   Left lateral flexion To knee joint c tightness   Right rotation     Left rotation      (Blank rows = not tested)   LOWER EXTREMITY ROM:        Right 11/10/2021 Left 11/10/2021  Hip flexion      Hip extension      Hip abduction      Hip adduction      Hip internal rotation      Hip external rotation      Knee flexion      Knee extension      Ankle dorsiflexion      Ankle plantarflexion      Ankle inversion      Ankle eversion       (Blank rows = not tested)   LOWER EXTREMITY MMT:     MMT Right 11/10/2021 Left 11/10/2021 Right 11/22/2021 Left 11/22/2021  Hip flexion 5/5 5/5     Hip extension        Hip abduction        Hip  adduction        Hip internal rotation        Hip external rotation        Knee flexion 5/5 5/5    Knee extension 4/5 4/5 4/5 5/5  Ankle dorsiflexion 5/5 5/5    Ankle plantarflexion        Ankle inversion        Ankle eversion         (Blank rows = not tested)   LUMBAR SPECIAL TESTS:  11/10/2021 (-) slump bilateral   FUNCTIONAL TESTS:  11/17/2021:  Functional squat over chair 18 inch height:  Movement good until halfway, then increased lumbar flexion and decreased control.   11/10/2021 18 inch transfer s UE assist:  able to perform, difficulty noted   GAIT: 11/10/2021 Independent ambulation in clinic.       TODAY'S TREATMENT  12/01/2021 TherEx:  Prone opposite arm/leg lift 2 sec hold x 10 bilateral  Qruped arm/leg lift one at time 2 sec hold x 10 bilateral  Doorway hip hike 5 sec hold x 10 bilateral, bilateral UE support in doorframe  Seated on pball blue band rows c single leg hold off floor x 10 bilateral  Standing lumbar extension ROM x 5  TherActivity  Leg press flat back double leg 125 lbs x 15, single leg 62 lbs 2 x 15 performed bilaterally  Lateral step down eccentric control x 15 6 inch bilaterally  11/28/2021 TherEx:  Recumbent bike lvl 5 10 mins 50-60 rpm  Blue band double walk out anti rotation ab activation 5 sec hold x 10 bilateral  Doorway hip hike 5 sec hold x 10 bilateral    Prone opposite arm/leg lift 3 sec hold x 10 bilateral  TherActivity  Leg press flat back double leg 125 lbs x 15, single leg 62 lbs x 15 performed bilaterally  Lateral step down eccentric control 2 x 15 6 inch bilaterally  11/24/2021 TherEx:  UBE LE only mostly lvl 4.5 seat 11 10 mins RPM 50-60   Leg extension machine double leg up, eccentric lowering 15 lbs  x 10 bilateral  Blue band double walk out anti rotation ab activation 5 sec hold x 10 bilateral  Seated pball blue band rows with 10 reps with one leg off floor (performed  bilateral)  Seated pball blue band gh ext x 20 c feet on floor  Prone plank on knees 45 sec x 3  Prone opposite arm/leg lift 3 sec hold x 10 bilateral     PATIENT EDUCATION:  11/28/2021 Education details: HEP update, general gym based education on techniques.  Person educated: Patient Education method: Explanation, Demonstration, Verbal cues, and Handouts Education comprehension: verbalized understanding, returned demonstration, and verbal cues required       HOME EXERCISE PROGRAM: Access Code: [email or web address] URL: [email or web address] Date: 11/28/2021 Prepared by: Scot Jun  Exercises - Standing Lumbar Extension with Counter  - 3-5 x daily - 7 x weekly - 1 sets - 5-10 reps - Supine Lower Trunk Rotation  - 2-3 x daily - 7 x weekly - 1 sets - 3-5 reps - 15 hold - Supine Bridge  - 1-2 x daily - 7 x weekly - 1-2 sets - 10 reps - 2 hold - Seated Straight Leg Heel Taps  - 1-2 x daily - 7 x weekly - 3 sets - 10 reps - Sit to Stand  - 3 x daily - 7 x weekly - 1 sets - 10 reps - Prone Alternating Arm  and Leg Lifts  - 1-2 x daily - 7 x weekly - 1-2 sets - 10 reps - 3 hold - Standing Hip Hiking  - 1-2 x daily - 7 x weekly - 1 sets - 10-15 reps - 5 hold   ASSESSMENT:   CLINICAL IMPRESSION: Pt reported positive symptom management c use of stretching in HEP.   Continued strengthening program to help core and Rt leg to help reduce strain and loading in daily functional tasks such as stairs, lifting/carrying.  Rt hip stability in WB notably troublesome compared to Lt (addressed in strengthening intervention).    OBJECTIVE IMPAIRMENTS decreased activity tolerance, decreased coordination, decreased mobility, difficulty walking, decreased ROM, decreased strength, hypomobility, increased fascial restrictions, impaired perceived functional ability, increased muscle spasms, impaired flexibility, improper body mechanics, postural dysfunction, and pain.    ACTIVITY LIMITATIONS carrying,  lifting, bending, sitting, standing, squatting, sleeping, transfers, bed mobility, and locomotion level   PARTICIPATION LIMITATIONS: cleaning, laundry, interpersonal relationship, community activity, and yard work   PERSONAL FACTORS Time since onset of injury/illness/exacerbation and DDD cervical, history of lumbar fusion, bilateral hand complaints concurrent  are also affecting patient's functional outcome.    REHAB POTENTIAL: Good   CLINICAL DECISION MAKING: Stable/uncomplicated   EVALUATION COMPLEXITY: Low     GOALS: Goals reviewed with patient? Yes   Short term PT Goals (target date for Short term goals are 3 weeks 12/01/2021) Patient will demonstrate independent use of home exercise program to maintain progress from in clinic treatments. Goal status: on going - assessed 11/17/2021   Long term PT goals (target dates for all long term goals are 10 weeks  01/19/2022 )   1. Patient will demonstrate/report pain at worst less than or equal to 2/10 to facilitate minimal limitation in daily activity secondary to pain symptoms. Goal status: on going- assessed 11/22/2021   2. Patient will demonstrate independent use of home exercise program to facilitate ability to maintain/progress functional gains from skilled physical therapy services. Goal status: on going- assessed 11/22/2021   3. Patient will demonstrate FOTO outcome > or = 55 % to indicate reduced disability due to condition. Goal status: on going- assessed 11/22/2021   4. Patient will demonstrate lumbar extension 100 % WFL s symptoms to facilitate upright standing, walking posture at PLOF s limitation. Goal status: on going- assessed 11/22/2021   5.  Patient will demonstrate bilateral LE MMT 5/5 throughout to facilitate usual daily house and yard work activity at [employer].    Goal status: on going- assessed 11/22/2021   6.  Patient will demonstrate/report ability to perform sitting/standing/walking > 30 mins s limitation due to symptoms.    Goal status: on going- assessed 11/22/2021       PLAN: PT FREQUENCY: 1-2x/week   PT DURATION: 10 weeks   PLANNED INTERVENTIONS: Therapeutic exercises, Therapeutic activity, Neuro Muscular re-education, Balance training, Gait training, Patient/Family education, Joint mobilization, Stair training, DME instructions, Dry Needling, Electrical stimulation, Cryotherapy, Moist heat, Taping, Traction Ultrasound, Ionotophoresis '4mg'$ /ml Dexamethasone, and Manual therapy.  All included unless contraindicated     PLAN FOR NEXT SESSION: Reduce frequency of clinic visits to allow improved transitioning to idea of HEP only in future.   FOTO reassessment    Scot Jun, PT, DPT, OCS, ATC 12/01/21  8:43 AM

## 2021-12-06 ENCOUNTER — Encounter: Payer: Medicare Other | Admitting: Rehabilitative and Restorative Service Providers"

## 2021-12-08 ENCOUNTER — Encounter: Payer: Medicare Other | Admitting: Rehabilitative and Restorative Service Providers"

## 2021-12-15 ENCOUNTER — Encounter: Payer: Medicare Other | Admitting: Rehabilitative and Restorative Service Providers"

## 2021-12-22 ENCOUNTER — Encounter: Payer: Medicare Other | Admitting: Rehabilitative and Restorative Service Providers"

## 2021-12-22 DIAGNOSIS — M19041 Primary osteoarthritis, right hand: Secondary | ICD-10-CM | POA: Diagnosis not present

## 2021-12-22 DIAGNOSIS — M79642 Pain in left hand: Secondary | ICD-10-CM | POA: Diagnosis not present

## 2021-12-22 DIAGNOSIS — M79641 Pain in right hand: Secondary | ICD-10-CM | POA: Diagnosis not present

## 2021-12-22 DIAGNOSIS — M19042 Primary osteoarthritis, left hand: Secondary | ICD-10-CM | POA: Diagnosis not present

## 2021-12-29 ENCOUNTER — Encounter: Payer: Self-pay | Admitting: Rehabilitative and Restorative Service Providers"

## 2021-12-29 ENCOUNTER — Ambulatory Visit (INDEPENDENT_AMBULATORY_CARE_PROVIDER_SITE_OTHER): Payer: BC Managed Care – PPO | Admitting: Rehabilitative and Restorative Service Providers"

## 2021-12-29 DIAGNOSIS — R293 Abnormal posture: Secondary | ICD-10-CM

## 2021-12-29 DIAGNOSIS — R262 Difficulty in walking, not elsewhere classified: Secondary | ICD-10-CM

## 2021-12-29 DIAGNOSIS — M5459 Other low back pain: Secondary | ICD-10-CM | POA: Diagnosis not present

## 2021-12-29 DIAGNOSIS — M6281 Muscle weakness (generalized): Secondary | ICD-10-CM

## 2021-12-29 NOTE — Therapy (Addendum)
OUTPATIENT PHYSICAL THERAPY TREATMENT NOTE /PROGRESS NOTE [place]   Patient Name: Dwayne Acosta. MRN: [id number] DOB:May 08, 1955, 66 y.o., male Today's Date: 12/29/2021  PCP: Sharilyn Sites. MD REFERRING PROVIDER: Jessy Oto, MD  Progress Note Reporting Period 11/10/2021 to 12/29/2021  See note below for Objective Data and Assessment of Progress/Goals.      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 10/23/2012   Overactive bladder    Spinal stenosis of lumbar region with neurogenic claudication 12/05/2016   Spondylosis without myelopathy or radiculopathy, cervical region 11/21/2016   Past Surgical History:  Procedure Laterality Date   APPENDECTOMY     BACK SURGERY  2005   FOOT SURGERY Left    bunion   LAPAROSCOPIC APPENDECTOMY N/A 07/20/2016   Procedure: APPENDECTOMY LAPAROSCOPIC;  Surgeon: Armandina Gemma, MD;  Location: [facility];  Service: General;  Laterality: N/A;   LUMBAR FUSION  12/05/2016   ROTATOR CUFF REPAIR Bilateral    Patient Active Problem List   Diagnosis Date Noted   Spinal stenosis of lumbar region 12/05/2016    Class: Chronic   Spinal stenosis of lumbar region with neurogenic claudication 12/05/2016   Degenerative disc disease, cervical 11/21/2016    Spondylosis without myelopathy or radiculopathy, cervical region 11/21/2016   Acute appendicitis 07/20/2016   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   Bladder neck obstruction 02/26/2015   Increased frequency of urination 02/26/2015   Urinary urgency 02/26/2015   Conjunctivitis, chemical 04/16/2013   Dry eye syndrome 12/12/2012   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 12/12/2012   NS (nuclear sclerosis) 10/23/2012   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 10/23/2012    REFERRING DIAG: Z79.1 (ICD-10-CM) - NSAID long-term use M53.3,G89.29 (ICD-10-CM) - Chronic right SI joint pain M47.816 (ICD-10-CM) - Spondylosis without myelopathy or radiculopathy, lumbar region M19.041,M19.042 (ICD-10-CM) - Localized osteoarthritis of hands, bilateral  THERAPY DIAG:  Other low back pain  Abnormal posture  Muscle weakness (generalized)  Difficulty in walking, not elsewhere classified  Rationale for Evaluation and Treatment Rehabilitation  ONSET DATE: 3-4 years since surgery   SUBJECTIVE:  SUBJECTIVE STATEMENT: Pt indicated doing pretty good overall since last visit.  Pt indicated he had to hold out of therapy due to exposure to COVID in his house.  Pt. Indicated he continued to work on strengthening.  Pt indicated he has been working in yard and doing things that can increase back symptoms but able to improve c resting.    PERTINENT HISTORY:  DDD cervical, Spinal stenosis history of fusion L4-S1   PAIN:  NPRS scale: 0/10 upon arrival.  At worst: 5/10. (Occurs with increased activity) Pain location: back Pain description: stiffness/tightness, spasms in leg Aggravating factors: after exercise Relieving factors: OTC, movement, ice     PRECAUTIONS: Concurrent  complaints of hand pain  (has referral for OT)   WEIGHT BEARING RESTRICTIONS No   FALLS:  Has patient fallen in last 6 months? No   LIVING ENVIRONMENT: Lives in: House/apartment Stairs: multilevel house, bedroom on first floor. A couple stairs to enter.      OCCUPATION: Retired    PLOF: Independent, gym workouts, yardwork    PATIENT GOALS What to do about pain to make it better     OBJECTIVE:    DIAGNOSTIC FINDINGS:  11/10/2021  Imaging review: Findings:  AP and lateral flexion and extension radiographs of the lumbar spine shows  fusion L4 to S1 with cages and both  L4-5 and L5-S1 pedicle screws and rods at L4-5. There is moderately severe  degenerative disc changes at the L3-4 and mild changes at L2-3 with  anterior spurs at both of these levels and significant disc narrowing with  end plate scleroosis at V7-8. Grade 1 retrolisthesis and lateral listhesis  to the right side at L3-4. Fusions at L4-5 and L5-S1 appear solid.    PATIENT SURVEYS:  12/29/2021  FOTO update:  57  11/10/2021 FOTO intake: 51   predicted:  55   SCREENING FOR RED FLAGS: 11/10/2021 Bowel or bladder incontinence: No Cauda equina syndrome: No   COGNITION: 11/10/2021            Overall cognitive status: WFL normal              SENSATION: 11/10/2021 [facility]   MUSCLE LENGTH: 11/10/2021 No specific checks today   POSTURE:  11/10/2021 Reduced lumbar lordosis in standing   PALPATION: 11/10/2021 Mild tenderness lumbar paraspinals bilaterally.    LUMBAR ROM:    Active  AROM  11/10/2021 AROM 11/28/2021 AROM 12/29/2021  Flexion      Extension 50 % c ERP lumbar,    Repeated x 5 in standing, improved to 75 % c reduced back pain 75% WFL 100 % WFL  Right lateral flexion To knee joint c pain    Left lateral flexion To knee joint c tightness    Right rotation      Left rotation       (Blank rows = not tested)   LOWER EXTREMITY ROM:        Right 11/10/2021 Left 11/10/2021  Hip flexion      Hip extension       Hip abduction      Hip adduction      Hip internal rotation      Hip external rotation      Knee flexion      Knee extension      Ankle dorsiflexion      Ankle plantarflexion      Ankle inversion      Ankle eversion       (Blank rows =  not tested)   LOWER EXTREMITY MMT:     MMT Right 11/10/2021 Left 11/10/2021 Right 11/22/2021 Left 11/22/2021 Right 12/29/2021  Hip flexion 5/5 5/5     Hip extension         Hip abduction         Hip adduction         Hip internal rotation         Hip external rotation         Knee flexion 5/5 5/5     Knee extension 4/5 4/5 4/5 5/5 5/5  Ankle dorsiflexion 5/5 5/5     Ankle plantarflexion         Ankle inversion         Ankle eversion          (Blank rows = not tested)   LUMBAR SPECIAL TESTS:  11/10/2021 (-) slump bilateral   FUNCTIONAL TESTS:  11/17/2021:  Functional squat over chair 18 inch height:  Movement good until halfway, then increased lumbar flexion and decreased control.   11/10/2021 18 inch transfer s UE assist:  able to perform, difficulty noted   GAIT: 11/10/2021 Independent ambulation in clinic.       TODAY'S TREATMENT  12/29/2021 Therex: Review of existing HEP for ensure good knowledge and techniques for symptom relief activity in HEP.  Continued education on progressive adjustments to exercise at gym to avoid overworking.  Standing lumbar extension x 5 Supine bridge x 10, single leg bridge in figure 4 x 5 bilateral Standing hip hike c hip abduction hold into doorframe 5 sec hold x 10  Recumbent bike lvl 4 10 mins    12/01/2021 TherEx:  Prone opposite arm/leg lift 2 sec hold x 10 bilateral  Qruped arm/leg lift one at time 2 sec hold x 10 bilateral  Doorway hip hike 5 sec hold x 10 bilateral, bilateral UE support in doorframe  Seated on pball blue band rows c single leg hold off floor x 10 bilateral  Standing lumbar extension ROM x 5  TherActivity  Leg press flat back double leg 125 lbs x 15, single leg 62 lbs 2 x  15 performed bilaterally  Lateral step down eccentric control x 15 6 inch bilaterally  11/28/2021 TherEx:  Recumbent bike lvl 5 10 mins 50-60 rpm  Blue band double walk out anti rotation ab activation 5 sec hold x 10 bilateral  Doorway hip hike 5 sec hold x 10 bilateral    Prone opposite arm/leg lift 3 sec hold x 10 bilateral  TherActivity  Leg press flat back double leg 125 lbs x 15, single leg 62 lbs x 15 performed bilaterally  Lateral step down eccentric control 2 x 15 6 inch bilaterally    PATIENT EDUCATION:  12/29/2021 Education details: HEP update, general gym based education on techniques.  Person educated: Patient Education method: Explanation, Demonstration, Verbal cues, and Handouts Education comprehension: verbalized understanding, returned demonstration, and verbal cues required       HOME EXERCISE PROGRAM: Access Code: [email or web address] URL: [email or web address] Date: 11/28/2021 Prepared by: Scot Jun  Exercises - Standing Lumbar Extension with Counter  - 3-5 x daily - 7 x weekly - 1 sets - 5-10 reps - Supine Lower Trunk Rotation  - 2-3 x daily - 7 x weekly - 1 sets - 3-5 reps - 15 hold - Supine Bridge  - 1-2 x daily - 7 x weekly - 1-2 sets - 10 reps - 2 hold - Seated Straight Leg Heel Taps  -  1-2 x daily - 7 x weekly - 3 sets - 10 reps - Sit to Stand  - 3 x daily - 7 x weekly - 1 sets - 10 reps - Prone Alternating Arm and Leg Lifts  - 1-2 x daily - 7 x weekly - 1-2 sets - 10 reps - 3 hold - Standing Hip Hiking  - 1-2 x daily - 7 x weekly - 1 sets - 10-15 reps - 5 hold   ASSESSMENT:   CLINICAL IMPRESSION: Pt has attended 7 visits overall during course of treatment.  See objective data for updated information.  Pt has demonstrated improvements as noted in FOTO and objective data.  Pt may continue to benefit from HEP comprised of lumbar mobility/strengthening and general    OBJECTIVE IMPAIRMENTS decreased activity tolerance, decreased coordination,  decreased mobility, difficulty walking, decreased ROM, decreased strength, hypomobility, increased fascial restrictions, impaired perceived functional ability, increased muscle spasms, impaired flexibility, improper body mechanics, postural dysfunction, and pain.    ACTIVITY LIMITATIONS carrying, lifting, bending, sitting, standing, squatting, sleeping, transfers, bed mobility, and locomotion level   PARTICIPATION LIMITATIONS: cleaning, laundry, interpersonal relationship, community activity, and yard work   PERSONAL FACTORS Time since onset of injury/illness/exacerbation and DDD cervical, history of lumbar fusion, bilateral hand complaints concurrent  are also affecting patient's functional outcome.    REHAB POTENTIAL: Good   CLINICAL DECISION MAKING: Stable/uncomplicated   EVALUATION COMPLEXITY: Low     GOALS: Goals reviewed with patient? Yes   Short term PT Goals (target date for Short term goals are 3 weeks 12/01/2021) Patient will demonstrate independent use of home exercise program to maintain progress from in clinic treatments. Goal status: MET   Long term PT goals (target dates for all long term goals are 10 weeks  01/19/2022 )   1. Patient will demonstrate/report pain at worst less than or equal to 2/10 to facilitate minimal limitation in daily activity secondary to pain symptoms. Goal status: partially met 12/29/2021   2. Patient will demonstrate independent use of home exercise program to facilitate ability to maintain/progress functional gains from skilled physical therapy services. Goal status: Met 12/29/2021   3. Patient will demonstrate FOTO outcome > or = 55 % to indicate reduced disability due to condition. Goal status: Met 12/29/2021   4. Patient will demonstrate lumbar extension 100 % WFL s symptoms to facilitate upright standing, walking posture at PLOF s limitation. Goal status: Met 12/29/2021   5.  Patient will demonstrate bilateral LE MMT 5/5 throughout to  facilitate usual daily house and yard work activity at [employer].    Goal status: Met 12/29/2021   6.  Patient will demonstrate/report ability to perform sitting/standing/walking > 30 mins s limitation due to symptoms.   Goal status: Met 12/29/2021       PLAN: PT FREQUENCY: 1-2x/week   PT DURATION: 10 weeks   PLANNED INTERVENTIONS: Therapeutic exercises, Therapeutic activity, Neuro Muscular re-education, Balance training, Gait training, Patient/Family education, Joint mobilization, Stair training, DME instructions, Dry Needling, Electrical stimulation, Cryotherapy, Moist heat, Taping, Traction Ultrasound, Ionotophoresis 4mg /ml Dexamethasone, and Manual therapy.  All included unless contraindicated     PLAN FOR NEXT SESSION: Trial HEP. Discharge after 30 days inactivity.    Scot Jun, PT, DPT, OCS, ATC 12/29/21  8:44 AM   PHYSICAL THERAPY DISCHARGE SUMMARY  Visits from Start of Care: 7  Current functional level related to goals / functional outcomes: See note   Remaining deficits: See note   Education / Equipment: HEP  Patient  goals were met. Patient is being discharged due to not returning since the last visit.  Scot Jun, PT, DPT, OCS, ATC 02/08/22  11:57 AM

## 2022-01-04 ENCOUNTER — Ambulatory Visit (INDEPENDENT_AMBULATORY_CARE_PROVIDER_SITE_OTHER): Payer: BC Managed Care – PPO | Admitting: Podiatry

## 2022-01-04 DIAGNOSIS — L600 Ingrowing nail: Secondary | ICD-10-CM

## 2022-01-04 NOTE — Progress Notes (Signed)
Subjective:   Patient ID: Dwayne Acosta., male   DOB: 66 y.o.   MRN: [id number]   HPI Dwayne Acosta presents with pain under the right great toenail and stated was very sore last night and some during the day today but seems better now but wants it checked neuro   ROS      Objective:  Physical Exam  Vascular status intact with patient's right nail site hallux slightly loose no drainage no other erythema edema noted     Assessment:  Possibility for nail trauma which is created the problem patient is experiencing     Plan:  H&P reviewed trauma with patient and applied cushioning to the area and begin soaks.  If it should start to get more sore or get loose the nail will need to be removed and patient understands this

## 2022-01-05 ENCOUNTER — Encounter: Payer: Medicare Other | Admitting: Rehabilitative and Restorative Service Providers"

## 2022-01-12 ENCOUNTER — Encounter: Payer: Medicare Other | Admitting: Rehabilitative and Restorative Service Providers"

## 2022-01-24 ENCOUNTER — Other Ambulatory Visit: Payer: Self-pay | Admitting: Radiology

## 2022-01-24 ENCOUNTER — Telehealth: Payer: Self-pay | Admitting: Orthopedic Surgery

## 2022-01-24 MED ORDER — DICLOFENAC SODIUM 50 MG PO TBEC: 50.0000 mg | DELAYED_RELEASE_TABLET | Freq: Three times a day (TID) | ORAL | 6 refills | Status: DC | PRN

## 2022-01-24 NOTE — Telephone Encounter (Signed)
This is pending with Dr. Lorin Mercy since he is on call today for the office.

## 2022-01-24 NOTE — Telephone Encounter (Signed)
Patient needs diclofenac refilled this is one of Dr Louanne Skye patients and not been in the office Since Dr Louanne Skye has been gone. Best contact number for patient [phone]

## 2022-04-17 DIAGNOSIS — H401131 Primary open-angle glaucoma, bilateral, mild stage: Secondary | ICD-10-CM | POA: Diagnosis not present

## 2022-04-20 DIAGNOSIS — E7849 Other hyperlipidemia: Secondary | ICD-10-CM | POA: Diagnosis not present

## 2022-04-20 DIAGNOSIS — E291 Testicular hypofunction: Secondary | ICD-10-CM | POA: Diagnosis not present

## 2022-04-20 DIAGNOSIS — Z6826 Body mass index (BMI) 26.0-26.9, adult: Secondary | ICD-10-CM | POA: Diagnosis not present

## 2022-04-20 DIAGNOSIS — Z1331 Encounter for screening for depression: Secondary | ICD-10-CM | POA: Diagnosis not present

## 2022-04-20 DIAGNOSIS — H6120 Impacted cerumen, unspecified ear: Secondary | ICD-10-CM | POA: Diagnosis not present

## 2022-04-20 DIAGNOSIS — E663 Overweight: Secondary | ICD-10-CM | POA: Diagnosis not present

## 2022-04-20 DIAGNOSIS — Z0000 Encounter for general adult medical examination without abnormal findings: Secondary | ICD-10-CM | POA: Diagnosis not present

## 2022-04-20 DIAGNOSIS — J302 Other seasonal allergic rhinitis: Secondary | ICD-10-CM | POA: Diagnosis not present

## 2022-04-20 DIAGNOSIS — E782 Mixed hyperlipidemia: Secondary | ICD-10-CM | POA: Diagnosis not present

## 2022-04-28 ENCOUNTER — Other Ambulatory Visit: Payer: Self-pay | Admitting: Orthopedic Surgery

## 2022-04-28 MED ORDER — GABAPENTIN 100 MG PO CAPS: 100.0000 mg | ORAL_CAPSULE | Freq: Three times a day (TID) | ORAL | 1 refills | Status: AC

## 2022-04-28 MED ORDER — METHOCARBAMOL 750 MG PO TABS
750.0000 mg | ORAL_TABLET | Freq: Three times a day (TID) | ORAL | 0 refills | Status: DC | PRN
Start: 2022-04-28 — End: 2023-05-15

## 2022-04-28 NOTE — Addendum Note (Signed)
Addended by: Minda Ditto, Geoffery Spruce on: 04/28/2022 10:08 AM   Modules accepted: Orders

## 2022-04-28 NOTE — Telephone Encounter (Signed)
Patient needs Methocarbamol and Gabapentin please advise last filled by Lorin Mercy patient was previous [place] patient, patient does have upcoming appt with Laurance Flatten

## 2022-05-01 DIAGNOSIS — Z713 Dietary counseling and surveillance: Secondary | ICD-10-CM | POA: Diagnosis not present

## 2022-05-01 DIAGNOSIS — Z6826 Body mass index (BMI) 26.0-26.9, adult: Secondary | ICD-10-CM | POA: Diagnosis not present

## 2022-05-01 DIAGNOSIS — Z136 Encounter for screening for cardiovascular disorders: Secondary | ICD-10-CM | POA: Diagnosis not present

## 2022-05-01 DIAGNOSIS — E7800 Pure hypercholesterolemia, unspecified: Secondary | ICD-10-CM | POA: Diagnosis not present

## 2022-05-01 DIAGNOSIS — Z13220 Encounter for screening for lipoid disorders: Secondary | ICD-10-CM | POA: Diagnosis not present

## 2022-05-25 ENCOUNTER — Ambulatory Visit (INDEPENDENT_AMBULATORY_CARE_PROVIDER_SITE_OTHER): Payer: BC Managed Care – PPO | Admitting: Orthopedic Surgery

## 2022-05-25 ENCOUNTER — Other Ambulatory Visit (INDEPENDENT_AMBULATORY_CARE_PROVIDER_SITE_OTHER): Payer: BC Managed Care – PPO

## 2022-05-25 ENCOUNTER — Encounter: Payer: Self-pay | Admitting: Orthopedic Surgery

## 2022-05-25 VITALS — BP 129/81 | HR 64 | Ht 69.0 in | Wt 176.0 lb

## 2022-05-25 DIAGNOSIS — M4156 Other secondary scoliosis, lumbar region: Secondary | ICD-10-CM | POA: Diagnosis not present

## 2022-05-25 DIAGNOSIS — M5136 Other intervertebral disc degeneration, lumbar region: Secondary | ICD-10-CM

## 2022-05-25 DIAGNOSIS — Z981 Arthrodesis status: Secondary | ICD-10-CM

## 2022-05-25 NOTE — Progress Notes (Signed)
Orthopedic Spine Surgery Office Note  Assessment: Patient is a 67 y.o. male who was previously undergone L4/5 and L5/S1 TLIF and posterior instrumented spinal fusion with my retired partner Dr. Louanne Skye.  He comes in today for routine follow-up   Plan: -Explained that initially conservative treatment is tried as a significant number of patients may experience relief with these treatment modalities. Discussed that the conservative treatments include:  -activity modification  -physical therapy  -over the counter pain medications  -medrol dosepak  -lumbar steroid injections -Patient has tried diclofenac, gabapentin, Robaxin.  He states he gets good relief with these so I told him I would refill them if needed.  He currently has enough so he will call if he needs a refill -Patient should return to office in 26 weeks, x-rays at next visit: AP/latera/flex/ex lumbar   Patient expressed understanding of the plan and all questions were answered to the patient's satisfaction.   ___________________________________________________________________________   History:  Patient is a 67 y.o. male who presents today for lumbar spine.  Patient has undergone L4/5 and L5/S1 TLIF and posterior instrumented spinal fusion with my partner Dr. Louanne Skye.  He states his leg pain got significant better after those surgeries.  He has had chronic low back pain that got a little better with the surgery but is still present.  He states that he is found diclofenac, gabapentin, Robaxin, and a gait belt helpful for his back pain.  He says pain is worse in the morning gets better as the day goes on.  He has found that high-impact exercises or elliptical causes him increased pain.  He has been doing okay on a stairmaster and walking.  He sometimes gets pain radiating into his buttock and the anterior right thigh.  This pain is not as significant as back pain and is not constant.  No pain rating into the left lower extremity.  Sometimes  gets paresthesias in the anterior thigh on the right side.  No other numbness or paresthesias   Weakness: Denies Symptoms of imbalance: Denies Paresthesias and numbness: Yes, into right anterior thigh, no other numbness or paresthesias Bowel or bladder incontinence: Has chronic history of overactive bladder but no bowel or bladder incontinence.  No recent changes in bowel bladder habits Saddle anesthesia: Denies  Treatments tried: Gabapentin, Robaxin, diclofenac, gait belt  Review of systems: Denies fevers and chills, night sweats, unexplained weight loss, history of cancer, pain that wakes them at night  Past medical history: Chronic pain Overactive bladder Meibomian gland dysfunction Glaucoma  Allergies: NKDA  Past surgical history:  L4/5 TLIF and PSIF L5/S1 TLIF and PSIF Bilateral foot surgery Bilateral rotator cuff repair Appendectomy  Social history: Denies use of nicotine product (smoking, vaping, patches, smokeless) Alcohol use: Yes, 1 drink per week Denies recreational drug use   Physical Exam:  General: no acute distress, appears stated age Neurologic: alert, answering questions appropriately, following commands Respiratory: unlabored breathing on room air, symmetric chest rise Psychiatric: appropriate affect, normal cadence to speech   MSK (spine):  -Strength exam      Left  Right EHL    5/5  5/5 TA    5/5  5/5 GSC    5/5  5/5 Knee extension  5/5  5/5 Hip flexion   5/5  5/5  -Sensory exam    Sensation intact to light touch in L3-S1 nerve distributions of bilateral lower extremities  -Achilles DTR: 1/4 on the left, 1/4 on the right -Patellar tendon DTR: 2/4 on the left,  1/4 on the right  -Straight leg raise: negative bilaterally -Femoral nerve stretch test: negative bilaterally -Clonus: no beats bilaterally  -Left hip exam: No pain through range of motion, negative Stinchfield, negative FABER -Right hip exam: No pain through range of motion,  negative Stinchfield, negative FABER  Imaging: XR of the lumbar spine from 05/24/2021 was independently reviewed and interpreted, showing lateral listhesis at L3/4 above his prior fusion.  Posterior instrumentation at L4 and L5 with no lucency or backing out of the screws.  There are interbody devices at L4/5 and L5/S1.  No lucency around the cages and cages appear in appropriate position.  No fracture or dislocation seen.  No evidence of instability on flexion/extension views.  Disc height loss with anterior osteophyte formation at L3/4.   Patient name: Dwayne Acosta. Patient MRN: [id number] Date of visit: 05/25/22

## 2022-08-14 DIAGNOSIS — E663 Overweight: Secondary | ICD-10-CM | POA: Diagnosis not present

## 2022-08-14 DIAGNOSIS — Z6827 Body mass index (BMI) 27.0-27.9, adult: Secondary | ICD-10-CM | POA: Diagnosis not present

## 2022-08-14 DIAGNOSIS — J329 Chronic sinusitis, unspecified: Secondary | ICD-10-CM | POA: Diagnosis not present

## 2022-10-10 DIAGNOSIS — H401131 Primary open-angle glaucoma, bilateral, mild stage: Secondary | ICD-10-CM | POA: Diagnosis not present

## 2022-10-10 DIAGNOSIS — H2513 Age-related nuclear cataract, bilateral: Secondary | ICD-10-CM | POA: Diagnosis not present

## 2022-11-27 ENCOUNTER — Telehealth: Payer: Self-pay | Admitting: Podiatry

## 2022-11-27 ENCOUNTER — Other Ambulatory Visit (INDEPENDENT_AMBULATORY_CARE_PROVIDER_SITE_OTHER): Payer: BC Managed Care – PPO | Admitting: Podiatry

## 2022-11-27 MED ORDER — CICLOPIROX 8 % EX SOLN: Freq: Every day | CUTANEOUS | 0 refills | Status: AC

## 2022-11-27 NOTE — Progress Notes (Signed)
Rx for penlac sent to the patients pharmacy

## 2022-11-27 NOTE — Telephone Encounter (Signed)
Pt called and is needing a refill on the drops for his toes that he uses. It needs to go to [facility]. They sent request last week

## 2022-11-28 NOTE — Telephone Encounter (Signed)
Left message for pt that the medication was sent in and to call if any issues.

## 2022-11-29 ENCOUNTER — Ambulatory Visit (INDEPENDENT_AMBULATORY_CARE_PROVIDER_SITE_OTHER): Payer: BC Managed Care – PPO | Admitting: Orthopedic Surgery

## 2022-11-29 ENCOUNTER — Other Ambulatory Visit (INDEPENDENT_AMBULATORY_CARE_PROVIDER_SITE_OTHER): Payer: BC Managed Care – PPO

## 2022-11-29 DIAGNOSIS — M5136 Other intervertebral disc degeneration, lumbar region: Secondary | ICD-10-CM

## 2022-11-29 DIAGNOSIS — M4156 Other secondary scoliosis, lumbar region: Secondary | ICD-10-CM

## 2022-11-29 DIAGNOSIS — Z981 Arthrodesis status: Secondary | ICD-10-CM

## 2022-11-29 NOTE — Progress Notes (Signed)
Orthopedic Spine Surgery Office Note   Assessment: Patient is a 67 y.o. male who was previously undergone L4/5 and L5/S1 TLIF and posterior instrumented spinal fusion with my retired partner Dr. Otelia Sergeant.  He comes in today for routine follow-up     Plan: -Since patient is currently with tolerable amount of pain, recommended no further intervention at this time.  He still finds diclofenac and Robaxin helpful.  Will prescribe those as needed for him -Patient should return to office in 6 months year, x-rays at next visit: AP/latera/flex/ex lumbar     Patient expressed understanding of the plan and all questions were answered to the patient's satisfaction.    ___________________________________________________________________________     History:   Patient is a 67 y.o. male who presents today for follow up on is lumbar spine.  Patient has previously undergone surgery with Dr. Otelia Sergeant.  He has had L4/5 and L5/S1 TLIF with posterior instrumented spinal fusion.  He is still having low back pain.  It is similar to the last time I saw him.  He is not having any pain radiating into either lower extremity.  He does not notice any weakness in his lower extremities.  He does not have any numbness or paresthesias.  No bowel or bladder incontinence.  No saddle anesthesia.  He finds the current amount of pain is tolerable.  He finds ways to change his activities to help with the pain.  He does find diclofenac and Robaxin helpful.   Treatments tried: Gabapentin, Robaxin, diclofenac     Physical Exam:   General: no acute distress, appears stated age Neurologic: alert, answering questions appropriately, following commands Respiratory: unlabored breathing on room air, symmetric chest rise Psychiatric: appropriate affect, normal cadence to speech     MSK (spine):   -Strength exam                                                   Left                  Right EHL                              5/5                   5/5 TA                                 5/5                  5/5 GSC                             5/5                  5/5 Knee extension            5/5                  5/5 Hip flexion                    5/5                  5/5   -  Sensory exam                           Sensation intact to light touch in L3-S1 nerve distributions of bilateral lower extremities   -Achilles DTR: 1/4 on the left, 1/4 on the right -Patellar tendon DTR: 2/4 on the left, 1/4 on the right     Imaging: XRs of the lumbar spine from 11/29/2022 were independently reviewed and interpreted, showing lateral listhesis at L3/4 above prior instrumented fusion.  There are interbody devices at L4/5 and L5/S1 that appear in appropriate position.  No lucency around the cages.  There is posterior instrumentation at L4 and L5.  There is no lucency around the screws and the screws of not backed out.  There is disc height loss with anterior osteophyte formation at L3/4.  No fracture or dislocation seen.    Patient name: Dwayne Acosta. Patient MRN: [id number] Date of visit: 11/29/22

## 2023-01-25 DIAGNOSIS — N138 Other obstructive and reflux uropathy: Secondary | ICD-10-CM | POA: Diagnosis not present

## 2023-01-25 DIAGNOSIS — R350 Frequency of micturition: Secondary | ICD-10-CM | POA: Diagnosis not present

## 2023-01-25 DIAGNOSIS — N320 Bladder-neck obstruction: Secondary | ICD-10-CM | POA: Diagnosis not present

## 2023-01-25 DIAGNOSIS — E291 Testicular hypofunction: Secondary | ICD-10-CM | POA: Diagnosis not present

## 2023-01-25 DIAGNOSIS — N401 Enlarged prostate with lower urinary tract symptoms: Secondary | ICD-10-CM | POA: Diagnosis not present

## 2023-04-20 DIAGNOSIS — Z1331 Encounter for screening for depression: Secondary | ICD-10-CM | POA: Diagnosis not present

## 2023-04-20 DIAGNOSIS — Z6826 Body mass index (BMI) 26.0-26.9, adult: Secondary | ICD-10-CM | POA: Diagnosis not present

## 2023-04-20 DIAGNOSIS — E291 Testicular hypofunction: Secondary | ICD-10-CM | POA: Diagnosis not present

## 2023-04-20 DIAGNOSIS — Z0000 Encounter for general adult medical examination without abnormal findings: Secondary | ICD-10-CM | POA: Diagnosis not present

## 2023-04-20 DIAGNOSIS — E782 Mixed hyperlipidemia: Secondary | ICD-10-CM | POA: Diagnosis not present

## 2023-05-01 DIAGNOSIS — E7800 Pure hypercholesterolemia, unspecified: Secondary | ICD-10-CM | POA: Diagnosis not present

## 2023-05-01 DIAGNOSIS — Z713 Dietary counseling and surveillance: Secondary | ICD-10-CM | POA: Diagnosis not present

## 2023-05-01 DIAGNOSIS — Z136 Encounter for screening for cardiovascular disorders: Secondary | ICD-10-CM | POA: Diagnosis not present

## 2023-05-01 DIAGNOSIS — Z13220 Encounter for screening for lipoid disorders: Secondary | ICD-10-CM | POA: Diagnosis not present

## 2023-05-01 DIAGNOSIS — Z6825 Body mass index (BMI) 25.0-25.9, adult: Secondary | ICD-10-CM | POA: Diagnosis not present

## 2023-05-15 ENCOUNTER — Other Ambulatory Visit: Payer: Self-pay | Admitting: Orthopaedic Surgery

## 2023-05-15 ENCOUNTER — Other Ambulatory Visit: Payer: Self-pay | Admitting: Orthopedic Surgery

## 2023-05-18 DIAGNOSIS — H5213 Myopia, bilateral: Secondary | ICD-10-CM | POA: Diagnosis not present

## 2023-05-18 DIAGNOSIS — H2513 Age-related nuclear cataract, bilateral: Secondary | ICD-10-CM | POA: Diagnosis not present

## 2023-05-18 DIAGNOSIS — H401131 Primary open-angle glaucoma, bilateral, mild stage: Secondary | ICD-10-CM | POA: Diagnosis not present

## 2023-05-30 ENCOUNTER — Ambulatory Visit (INDEPENDENT_AMBULATORY_CARE_PROVIDER_SITE_OTHER): Payer: Medicare Other | Admitting: Orthopedic Surgery

## 2023-05-30 ENCOUNTER — Other Ambulatory Visit: Payer: Self-pay

## 2023-05-30 DIAGNOSIS — Z981 Arthrodesis status: Secondary | ICD-10-CM | POA: Diagnosis not present

## 2023-05-30 NOTE — Progress Notes (Signed)
 Orthopedic Spine Surgery Office Note   Assessment: Patient is a 68 y.o. male who was previously undergone L4/5 and L5/S1 TLIF and posterior instrumented spinal fusion with my retired partner Dr. Otelia Sergeant.  Having back pain but it is currently tolerable     Plan: -Patient feels pain is tolerable with diclofenac and Robaxin.  Will continue to prescribe those as needed -He is currently working with physical therapy and feels it is helpful so encouraged him to keep going to his sessions -Patient should return to office in 6 months year, x-rays at next visit: AP/latera/flex/ex lumbar     Patient expressed understanding of the plan and all questions were answered to the patient's satisfaction.    ___________________________________________________________________________     History:   Patient is a 68 y.o. male who presents today for follow up on is lumbar spine.  Patient has previously undergone surgery with Dr. Otelia Sergeant.  He has had L4/5 and L5/S1 TLIF with posterior instrumented spinal fusion.  Patient is still having low back pain.  He says is worse in the morning.  He has found a routine with pillows that have helped him.  He is not having as much pain now.  He still has some pain if he lifts heavy objects but if he is just going about his day he does not have any significant pain.  He is not having any radiating leg pain.   Treatments tried: PT, gabapentin, Robaxin, diclofenac     Physical Exam:   General: no acute distress, appears stated age Neurologic: alert, answering questions appropriately, following commands Respiratory: unlabored breathing on room air, symmetric chest rise Psychiatric: appropriate affect, normal cadence to speech     MSK (spine):   -Strength exam                                                   Left                  Right EHL                              5/5                  5/5 TA                                 5/5                  5/5 GSC                              5/5                  5/5 Knee extension            5/5                  5/5 Hip flexion                    5/5                  5/5   -Sensory exam  Sensation intact to light touch in L3-S1 nerve distributions of bilateral lower extremities   -Achilles DTR: 1/4 on the left, 1/4 on the right -Patellar tendon DTR: 1/4 on the left, 1/4 on the right     Imaging: XRs of the lumbar spine from 05/30/2023 were independently reviewed and interpreted, showing disc height loss at L3/4. Vacuum disc phenomenon at L3/4. Lateral listhesis at L3/4. Interbody devices seen at L4/5 and L5/S1 which appear in appropriate position. Posterior instrumentation is seen at L4 and L5/ No lucency seen around the screws. No evidence of instability on flexion/extension views. No fracture or dislocation seen.      Patient name: Dwayne Acosta. Patient MRN: [id number] Date of visit: 05/30/23

## 2023-08-02 DIAGNOSIS — J302 Other seasonal allergic rhinitis: Secondary | ICD-10-CM | POA: Diagnosis not present

## 2023-08-02 DIAGNOSIS — Z6827 Body mass index (BMI) 27.0-27.9, adult: Secondary | ICD-10-CM | POA: Diagnosis not present

## 2023-08-02 DIAGNOSIS — Z20828 Contact with and (suspected) exposure to other viral communicable diseases: Secondary | ICD-10-CM | POA: Diagnosis not present

## 2023-08-02 DIAGNOSIS — J0100 Acute maxillary sinusitis, unspecified: Secondary | ICD-10-CM | POA: Diagnosis not present

## 2023-08-02 DIAGNOSIS — R6889 Other general symptoms and signs: Secondary | ICD-10-CM | POA: Diagnosis not present

## 2023-11-02 DIAGNOSIS — T2040XA Corrosion of unspecified degree of head, face, and neck, unspecified site, initial encounter: Secondary | ICD-10-CM | POA: Diagnosis not present

## 2023-11-02 DIAGNOSIS — R21 Rash and other nonspecific skin eruption: Secondary | ICD-10-CM | POA: Diagnosis not present

## 2023-11-16 DIAGNOSIS — M542 Cervicalgia: Secondary | ICD-10-CM | POA: Diagnosis not present

## 2023-11-16 DIAGNOSIS — S161XXA Strain of muscle, fascia and tendon at neck level, initial encounter: Secondary | ICD-10-CM | POA: Diagnosis not present

## 2023-11-16 DIAGNOSIS — X509XXA Other and unspecified overexertion or strenuous movements or postures, initial encounter: Secondary | ICD-10-CM | POA: Diagnosis not present

## 2023-11-29 ENCOUNTER — Other Ambulatory Visit (INDEPENDENT_AMBULATORY_CARE_PROVIDER_SITE_OTHER): Payer: Self-pay

## 2023-11-29 ENCOUNTER — Ambulatory Visit (INDEPENDENT_AMBULATORY_CARE_PROVIDER_SITE_OTHER): Admitting: Orthopedic Surgery

## 2023-11-29 DIAGNOSIS — Z981 Arthrodesis status: Secondary | ICD-10-CM

## 2023-11-29 DIAGNOSIS — M4156 Other secondary scoliosis, lumbar region: Secondary | ICD-10-CM

## 2023-11-29 DIAGNOSIS — M542 Cervicalgia: Secondary | ICD-10-CM

## 2023-11-29 NOTE — Progress Notes (Signed)
 Orthopedic Spine Surgery Office Note   Assessment: Patient is a 68 y.o. male who was previously undergone L4/5 and L5/S1 TLIF and posterior instrumented spinal fusion with my retired partner Dr. Lucilla.  Having back pain but it is currently tolerable.  Also coming in with recent onset neck pain with no radicular symptoms     Plan: -Since his pain is currently tolerable with methocarbamol  and diclofenac  gel, I told him to keep using those -For his neck, he can continue with the methocarbamol  and diclofenac  gel.  I told him that the majority of acute onset of neck or back pain to get better with conservative treatments over typically 6 weeks.  He has only had this pain for 2 weeks and is already getting better so expected to get better in the next month.  I told him that if this does not get better, then he should come back in and we can reevaluate it -Patient should return to office in 6 months year, x-rays at next visit: AP/latera/flex/ex lumbar     Patient expressed understanding of the plan and all questions were answered to the patient's satisfaction.    ___________________________________________________________________________     History:   Patient is a 68 y.o. male who presents today for follow up on is lumbar spine and for recent onset neck pain.  Patient states that his back pain has been pretty stable since he was last seen in the office.  He still feels the pain in his lower back but he has been using methocarbamol  and diclofenac  gel and that helps with the pain.  Makes the pain tolerable.  He continues to do exercises that were recommended by physical therapy.  He has no pain radiating into either lower extremity.  He notes the pain is worst in the morning where he feels stiff but gets better as the day goes on.  He also wanted talk about recent onset neck pain.  He said he was moving leaves that were on a tarp and strained his neck.  He went to urgent care where he was given an  injection and told to take methocarbamol  and use diclofenac  gel.  This was about 2 weeks ago.  His pain has gotten significantly better since onset.  It is not completely gone.  He has no pain radiating into either upper extremity.   Treatments tried: PT, gabapentin , Robaxin , diclofenac      Physical Exam:   General: no acute distress, appears stated age Neurologic: alert, answering questions appropriately, following commands Respiratory: unlabored breathing on room air, symmetric chest rise Psychiatric: appropriate affect, normal cadence to speech     MSK (spine):   -Strength exam                                                   Left                  Right Grip strength                5/5  5/5 Interosseus   5/5   5/5 Wrist extension  5/5  5/5 Wrist flexion   5/5  5/5 Elbow flexion   5/5  5/5 Deltoid    5/5  5/5  EHL  5/5                  5/5 TA                                 5/5                  5/5 GSC                             5/5                  5/5 Knee extension            5/5                  5/5 Hip flexion                    5/5                  5/5   -Sensory exam                 Sensation intact to light touch in C5-T1 nerve distributions of bilateral upper extremities             Sensation intact to light touch in L3-S1 nerve distributions of bilateral lower extremities   -Biceps DTR: 2/4 on the left, 2/4 on the right -Brachial radialis DTR: 2/4 in the left, 2/4 in the right -Achilles DTR: 1/4 on the left, 1/4 on the right -Patellar tendon DTR: 1/4 on the left, 1/4 on the right  -No beats of clonus bilaterally -Negative Hoffmann bilaterally -No interosseous muscle wasting seen -Negative grip and release test     Imaging: XRs of the lumbar spine from 11/29/2023 were independently reviewed and interpreted, showing disc height loss at L3/4.  There is anterior osteophyte formation at that level as well.  Vacuum disc phenomenon at  L3/4.  Interbody devices are seen at L4/5 and L5/S1.  There is posterior instrumentation at L4 and L5.  No lucency seen around the screws.  No evidence of instability on the flexion/extension views.  No fracture or dislocation seen.  XRs of the cervical spine from 11/29/2023 were independently reviewed and interpreted, showing disc height loss at C4/5, C5/6, and C6/7.  There is anterior osteophyte formation seen at those levels as well.  No evidence of instability in flexion/extension views.  No fracture or dislocation seen.   Patient name: Dwayne Acosta. Patient MRN: [id number] Date of visit: 11/29/23

## 2024-01-07 ENCOUNTER — Encounter: Payer: Self-pay | Admitting: Radiology

## 2024-01-20 DIAGNOSIS — M19072 Primary osteoarthritis, left ankle and foot: Secondary | ICD-10-CM | POA: Diagnosis not present

## 2024-01-20 DIAGNOSIS — M19071 Primary osteoarthritis, right ankle and foot: Secondary | ICD-10-CM | POA: Diagnosis not present

## 2024-01-23 ENCOUNTER — Encounter: Payer: Self-pay | Admitting: Podiatry

## 2024-01-23 ENCOUNTER — Ambulatory Visit (INDEPENDENT_AMBULATORY_CARE_PROVIDER_SITE_OTHER): Admitting: Podiatry

## 2024-01-23 ENCOUNTER — Ambulatory Visit (INDEPENDENT_AMBULATORY_CARE_PROVIDER_SITE_OTHER)

## 2024-01-23 DIAGNOSIS — M7752 Other enthesopathy of left foot: Secondary | ICD-10-CM

## 2024-01-23 DIAGNOSIS — M2042 Other hammer toe(s) (acquired), left foot: Secondary | ICD-10-CM

## 2024-01-23 DIAGNOSIS — B351 Tinea unguium: Secondary | ICD-10-CM

## 2024-01-23 DIAGNOSIS — M2041 Other hammer toe(s) (acquired), right foot: Secondary | ICD-10-CM

## 2024-01-23 NOTE — Progress Notes (Signed)
 Subjective:   Patient ID: Dwayne Acosta., male   DOB: 68 y.o.   MRN: [id number]   HPI Patient states he has developed right foot pain and swelling and has stinging in his left second third fourth toes and was concerned because his blood pressure has been moderately elevated.  Patient has not any other health issues and is seeing his family physician tomorrow   ROS      Objective:  Physical Exam  Neurovascular status intact with patient found to have stinging in the left lesser digits and right does have mild discomfort around the first MPJ but it has improved over the last couple of weeks.  Good digital perfusion well-oriented     Assessment:  Appears to be inflammatory and may be due more to the types of activities he is doing as he has been blowing leaves and on his feet a lot versus anything to do with systemic cause systemic inflammation blood pressure issues     Plan:  H&P all conditions reviewed and I went ahead today and I advised this patient on the stinging digits and I did review his x-rays do not see arthritis do not see any signs of acute injury and I explained all that to him  X-rays indicate no signs of stress fracture no signs of advanced arthritis bilateral

## 2024-01-24 DIAGNOSIS — E291 Testicular hypofunction: Secondary | ICD-10-CM | POA: Diagnosis not present

## 2024-01-24 DIAGNOSIS — I10 Essential (primary) hypertension: Secondary | ICD-10-CM | POA: Diagnosis not present

## 2024-01-24 DIAGNOSIS — E785 Hyperlipidemia, unspecified: Secondary | ICD-10-CM | POA: Diagnosis not present

## 2024-02-07 DIAGNOSIS — E291 Testicular hypofunction: Secondary | ICD-10-CM | POA: Diagnosis not present

## 2024-02-07 DIAGNOSIS — N320 Bladder-neck obstruction: Secondary | ICD-10-CM | POA: Diagnosis not present

## 2024-05-28 ENCOUNTER — Ambulatory Visit: Admitting: Orthopedic Surgery
